# Patient Record
Sex: Female | Born: 1955 | Race: White | Hispanic: No | Marital: Married | Smoking: Former smoker
Health system: Southern US, Community
[De-identification: ages and names within clinical notes are randomized; demographics above are authoritative.]

## PROBLEM LIST (undated history)

## (undated) DIAGNOSIS — G1221 Amyotrophic lateral sclerosis: Secondary | ICD-10-CM

## (undated) DIAGNOSIS — M1990 Unspecified osteoarthritis, unspecified site: Secondary | ICD-10-CM

## (undated) DIAGNOSIS — K219 Gastro-esophageal reflux disease without esophagitis: Secondary | ICD-10-CM

## (undated) DIAGNOSIS — M069 Rheumatoid arthritis, unspecified: Secondary | ICD-10-CM

## (undated) DIAGNOSIS — N200 Calculus of kidney: Secondary | ICD-10-CM

## (undated) DIAGNOSIS — H9193 Unspecified hearing loss, bilateral: Secondary | ICD-10-CM

## (undated) DIAGNOSIS — M21372 Foot drop, left foot: Secondary | ICD-10-CM

## (undated) DIAGNOSIS — K449 Diaphragmatic hernia without obstruction or gangrene: Secondary | ICD-10-CM

## (undated) DIAGNOSIS — R413 Other amnesia: Secondary | ICD-10-CM

## (undated) DIAGNOSIS — Z8719 Personal history of other diseases of the digestive system: Secondary | ICD-10-CM

## (undated) DIAGNOSIS — M810 Age-related osteoporosis without current pathological fracture: Secondary | ICD-10-CM

## (undated) DIAGNOSIS — R1310 Dysphagia, unspecified: Secondary | ICD-10-CM

## (undated) HISTORY — DX: Calculus of kidney: N20.0

## (undated) HISTORY — DX: Foot drop, left foot: M21.372

## (undated) HISTORY — PX: BREAST BIOPSY: SHX20

## (undated) HISTORY — DX: Dysphagia, unspecified: R13.10

## (undated) HISTORY — PX: ESOPHAGOGASTRODUODENOSCOPY: SHX1529

## (undated) HISTORY — DX: Diaphragmatic hernia without obstruction or gangrene: K44.9

## (undated) HISTORY — DX: Unspecified osteoarthritis, unspecified site: M19.90

## (undated) HISTORY — DX: Age-related osteoporosis without current pathological fracture: M81.0

## (undated) HISTORY — DX: Other amnesia: R41.3

## (undated) HISTORY — DX: Rheumatoid arthritis, unspecified: M06.9

## (undated) HISTORY — DX: Gastro-esophageal reflux disease without esophagitis: K21.9

## (undated) HISTORY — DX: Unspecified hearing loss, bilateral: H91.93

## (undated) HISTORY — PX: ESOPHAGEAL DILATION: SHX303

## (undated) HISTORY — DX: Personal history of other diseases of the digestive system: Z87.19

## (undated) HISTORY — PX: ORIF TIBIA FRACTURE: SHX5416

---

## 2000-05-08 HISTORY — PX: ABDOMINAL HYSTERECTOMY: SHX81

## 2004-04-26 ENCOUNTER — Ambulatory Visit: Payer: Self-pay | Admitting: Unknown Physician Specialty

## 2004-04-26 HISTORY — PX: COLONOSCOPY: SHX174

## 2005-02-01 ENCOUNTER — Ambulatory Visit: Payer: Self-pay | Admitting: Unknown Physician Specialty

## 2005-02-02 ENCOUNTER — Emergency Department: Payer: Self-pay | Admitting: Emergency Medicine

## 2005-02-02 ENCOUNTER — Other Ambulatory Visit: Payer: Self-pay

## 2005-05-23 ENCOUNTER — Ambulatory Visit: Payer: Self-pay | Admitting: Internal Medicine

## 2005-05-24 ENCOUNTER — Ambulatory Visit: Payer: Self-pay | Admitting: Internal Medicine

## 2005-09-07 ENCOUNTER — Ambulatory Visit: Payer: Self-pay | Admitting: Unknown Physician Specialty

## 2010-01-09 ENCOUNTER — Emergency Department: Payer: Self-pay | Admitting: Emergency Medicine

## 2010-05-11 ENCOUNTER — Ambulatory Visit: Payer: Self-pay | Admitting: Internal Medicine

## 2010-06-15 ENCOUNTER — Ambulatory Visit: Payer: Self-pay | Admitting: Unknown Physician Specialty

## 2011-05-09 HISTORY — PX: LEG SURGERY: SHX1003

## 2011-08-25 ENCOUNTER — Ambulatory Visit: Payer: Self-pay | Admitting: Internal Medicine

## 2011-09-04 ENCOUNTER — Ambulatory Visit: Payer: Self-pay | Admitting: Emergency Medicine

## 2011-12-20 ENCOUNTER — Emergency Department: Payer: Self-pay | Admitting: Unknown Physician Specialty

## 2011-12-20 LAB — CBC
HCT: 36.1 % (ref 35.0–47.0)
HGB: 11.9 g/dL — ABNORMAL LOW (ref 12.0–16.0)
MCH: 30.2 pg (ref 26.0–34.0)
MCHC: 33 g/dL (ref 32.0–36.0)
MCV: 92 fL (ref 80–100)
Platelet: 273 10*3/uL (ref 150–440)
RBC: 3.94 10*6/uL (ref 3.80–5.20)
RDW: 12.7 % (ref 11.5–14.5)
WBC: 14.2 10*3/uL — ABNORMAL HIGH (ref 3.6–11.0)

## 2011-12-20 LAB — COMPREHENSIVE METABOLIC PANEL
Albumin: 3.9 g/dL (ref 3.4–5.0)
Alkaline Phosphatase: 78 U/L (ref 50–136)
Anion Gap: 8 (ref 7–16)
BUN: 13 mg/dL (ref 7–18)
Bilirubin,Total: 0.6 mg/dL (ref 0.2–1.0)
Calcium, Total: 8.4 mg/dL — ABNORMAL LOW (ref 8.5–10.1)
Chloride: 105 mmol/L (ref 98–107)
Co2: 27 mmol/L (ref 21–32)
Creatinine: 0.7 mg/dL (ref 0.60–1.30)
EGFR (African American): >60
EGFR (Non-African Amer.): >60
Glucose: 123 mg/dL — ABNORMAL HIGH (ref 65–99)
Osmolality: 281 (ref 275–301)
Potassium: 3.4 mmol/L — ABNORMAL LOW (ref 3.5–5.1)
SGOT(AST): 63 U/L — ABNORMAL HIGH (ref 15–37)
SGPT (ALT): 49 U/L (ref 12–78)
Sodium: 140 mmol/L (ref 136–145)
Total Protein: 6.9 g/dL (ref 6.4–8.2)

## 2011-12-20 LAB — PROTIME-INR
INR: 0.9
Prothrombin Time: 12.8 secs (ref 11.5–14.7)

## 2011-12-20 LAB — APTT: Activated PTT: 24 secs (ref 23.6–35.9)

## 2012-03-05 ENCOUNTER — Encounter: Payer: Self-pay | Admitting: Orthopedic Surgery

## 2012-03-08 ENCOUNTER — Encounter: Payer: Self-pay | Admitting: Orthopedic Surgery

## 2012-04-07 ENCOUNTER — Encounter: Payer: Self-pay | Admitting: Orthopedic Surgery

## 2012-05-08 HISTORY — PX: CATARACT EXTRACTION: SUR2

## 2012-07-29 ENCOUNTER — Ambulatory Visit: Payer: Self-pay | Admitting: Ophthalmology

## 2012-08-19 ENCOUNTER — Ambulatory Visit: Payer: Self-pay | Admitting: Ophthalmology

## 2013-03-13 LAB — HM MAMMOGRAPHY

## 2013-08-21 DIAGNOSIS — R768 Other specified abnormal immunological findings in serum: Secondary | ICD-10-CM | POA: Insufficient documentation

## 2013-08-21 DIAGNOSIS — M1990 Unspecified osteoarthritis, unspecified site: Secondary | ICD-10-CM | POA: Insufficient documentation

## 2013-10-02 DIAGNOSIS — N200 Calculus of kidney: Secondary | ICD-10-CM | POA: Insufficient documentation

## 2013-11-04 ENCOUNTER — Emergency Department: Payer: Self-pay | Admitting: Emergency Medicine

## 2013-11-04 LAB — URINALYSIS, COMPLETE
Bacteria: NONE SEEN
Bilirubin,UR: NEGATIVE
Blood: NEGATIVE
Glucose,UR: NEGATIVE mg/dL (ref 0–75)
Leukocyte Esterase: NEGATIVE
Nitrite: NEGATIVE
Ph: 5 (ref 4.5–8.0)
Protein: NEGATIVE
RBC,UR: 2 /HPF (ref 0–5)
Specific Gravity: 1.017 (ref 1.003–1.030)
Squamous Epithelial: NONE SEEN
WBC UR: 1 /HPF (ref 0–5)

## 2013-11-04 LAB — CBC WITH DIFFERENTIAL/PLATELET
Basophil #: 0 10*3/uL (ref 0.0–0.1)
Basophil %: 0.3 %
Eosinophil #: 0.1 10*3/uL (ref 0.0–0.7)
Eosinophil %: 1 %
HCT: 39 % (ref 35.0–47.0)
HGB: 13.3 g/dL (ref 12.0–16.0)
Lymphocyte #: 2.9 10*3/uL (ref 1.0–3.6)
Lymphocyte %: 30.7 %
MCH: 31.2 pg (ref 26.0–34.0)
MCHC: 34 g/dL (ref 32.0–36.0)
MCV: 92 fL (ref 80–100)
Monocyte #: 0.6 x10 3/mm (ref 0.2–0.9)
Monocyte %: 6.7 %
Neutrophil #: 5.8 10*3/uL (ref 1.4–6.5)
Neutrophil %: 61.3 %
Platelet: 260 10*3/uL (ref 150–440)
RBC: 4.25 10*6/uL (ref 3.80–5.20)
RDW: 12.4 % (ref 11.5–14.5)
WBC: 9.4 10*3/uL (ref 3.6–11.0)

## 2013-11-04 LAB — COMPREHENSIVE METABOLIC PANEL
Albumin: 4.2 g/dL (ref 3.4–5.0)
Alkaline Phosphatase: 65 U/L
Anion Gap: 8 (ref 7–16)
BUN: 16 mg/dL (ref 7–18)
Bilirubin,Total: 1 mg/dL (ref 0.2–1.0)
Calcium, Total: 8.7 mg/dL (ref 8.5–10.1)
Chloride: 104 mmol/L (ref 98–107)
Co2: 24 mmol/L (ref 21–32)
Creatinine: 0.83 mg/dL (ref 0.60–1.30)
EGFR (African American): >60
EGFR (Non-African Amer.): >60
Glucose: 125 mg/dL — ABNORMAL HIGH (ref 65–99)
Osmolality: 275 (ref 275–301)
Potassium: 3.8 mmol/L (ref 3.5–5.1)
SGOT(AST): 33 U/L (ref 15–37)
SGPT (ALT): 31 U/L (ref 12–78)
Sodium: 136 mmol/L (ref 136–145)
Total Protein: 7.4 g/dL (ref 6.4–8.2)

## 2013-11-04 LAB — LIPASE, BLOOD: Lipase: 125 U/L (ref 73–393)

## 2014-02-11 ENCOUNTER — Ambulatory Visit: Payer: Self-pay | Admitting: Unknown Physician Specialty

## 2014-02-18 ENCOUNTER — Ambulatory Visit: Payer: Self-pay | Admitting: Podiatry

## 2014-03-08 HISTORY — PX: KNEE SURGERY: SHX244

## 2014-03-13 ENCOUNTER — Ambulatory Visit: Payer: Self-pay | Admitting: Unknown Physician Specialty

## 2014-03-23 LAB — HM PAP SMEAR

## 2014-03-26 ENCOUNTER — Ambulatory Visit: Payer: Self-pay | Admitting: Family Medicine

## 2014-04-03 ENCOUNTER — Ambulatory Visit: Payer: Self-pay | Admitting: Family Medicine

## 2014-05-07 DIAGNOSIS — Z9889 Other specified postprocedural states: Secondary | ICD-10-CM | POA: Insufficient documentation

## 2014-05-18 ENCOUNTER — Ambulatory Visit: Payer: Self-pay | Admitting: Family Medicine

## 2014-07-02 ENCOUNTER — Ambulatory Visit: Payer: Self-pay | Admitting: Unknown Physician Specialty

## 2014-07-02 DIAGNOSIS — IMO0001 Reserved for inherently not codable concepts without codable children: Secondary | ICD-10-CM | POA: Insufficient documentation

## 2014-07-13 ENCOUNTER — Ambulatory Visit: Payer: Self-pay | Admitting: Unknown Physician Specialty

## 2014-07-15 ENCOUNTER — Ambulatory Visit: Payer: Self-pay | Admitting: Unknown Physician Specialty

## 2014-08-28 NOTE — Op Note (Signed)
PATIENT NAME:  Amy Gregory, Amy Gregory MR#:  [id number] DATE OF BIRTH:  [birth year]  DATE OF PROCEDURE: 08/19/2012   PREOPERATIVE DIAGNOSIS: Cataract, right eye.   POSTOPERATIVE DIAGNOSIS: Cataract, right eye.   PROCEDURE PERFORMED: Extracapsular cataract extraction using phacoemulsification with placement of an Alcon SN6CWS, 12.5-diopter posterior chamber lens, serial [id number].   SURGEON: Maylon Peppers. Tomia Enlow, M.D.   ANESTHESIA: 4% lidocaine, 0.75% Marcaine in a 50:50 mixture with 10 units/mL of Hylenex added, given as a peribulbar.   ANESTHESIOLOGIST: Dr. Pernell Dupre.   COMPLICATIONS: None.   ESTIMATED BLOOD LOSS: Less than 1 mL.   DESCRIPTION OF PROCEDURE:  The patient was brought to the operating room and given a peribulbar block.  The patient was then prepped and draped in the usual fashion.  The vertical rectus muscles were imbricated using 5-0 silk sutures.  These sutures were then clamped to the sterile drapes as bridle sutures.  A limbal peritomy was performed extending two clock hours and hemostasis was obtained with cautery.  A partial thickness scleral groove was made at the surgical limbus and dissected anteriorly in a lamellar dissection using an Alcon crescent knife.  The anterior chamber was entered superonasally with a Superblade and through the lamellar dissection with a 2.6 mm keratome.  DisCoVisc was used to replace the aqueous and a continuous tear capsulorrhexis was carried out.  Hydrodissection and hydrodelineation were carried out with balanced salt and a 27 gauge canula.  The nucleus was rotated to confirm the effectiveness of the hydrodissection.  Phacoemulsification was carried out using a divide-and-conquer technique.  Total ultrasound time was 1 minute, 5.5 seconds, average power of 24.9%. CDE of 32.19. An AcrySert delivery system was used instead of a Paramedic.  Irrigation/aspiration was used to remove the residual cortex.  DisCoVisc was used to inflate the capsule  and the internal incision was enlarged to 3 mm with the crescent knife.  Irrigation/aspiration was used to remove the residual DisCoVisc.  Miostat was injected into the anterior chamber through the paracentesis track to inflate the anterior chamber and induce miosis.  The wound was checked for leaks and none were found. The conjunctiva was closed with cautery. Two drops of 0.3% Vigamox were placed on the eye.   An eye shield was placed on the eye.  The patient was discharged to the recovery room in good condition.     ____________________________ Maylon Peppers Lenord Fralix, MD sad:dm D: 08/19/2012 13:24:17 ET T: 08/19/2012 14:00:34 ET JOB#: [id number]  cc: Viviann Spare A. Myshawn Chiriboga, MD, <Dictator> Erline Levine MD ELECTRONICALLY SIGNED 08/26/2012 14:22

## 2014-08-28 NOTE — Op Note (Signed)
PATIENT NAME:  Amy Gregory, Amy Gregory MR#:  [id number] DATE OF BIRTH:  02-11-1956  DATE OF SURGERY:  07/29/2012  PREOPERATIVE DIAGNOSIS: Cataract, left eye.   POSTOPERATIVE DIAGNOSIS: Cataract, left eye.   PROCEDURE PERFORMED: Extracapsular cataract extraction using phacoemulsification with placement of an Alcon SN6CWS, 13.0-diopter posterior chamber lens, serial [id number].   SURGEON: Maylon Peppers. Arshiya Jakes, M.D.   ANESTHESIA: 4% lidocaine, 0.75% Marcaine in a 50:50 mixture with 10 units/mL of Hylenex  added given as a peribulbar.   ANESTHESIOLOGIST: Dr. Dimple Casey   COMPLICATIONS: None.   ESTIMATED BLOOD LOSS: Less than 1 mL.   DESCRIPTION OF PROCEDURE:  The patient was brought to the operating room and given a peribulbar block.  The patient was then prepped and draped in the usual fashion.  The vertical rectus muscles were imbricated using 5-0 silk sutures.  These sutures were then clamped to the sterile drapes as bridle sutures.  A limbal peritomy was performed extending two clock hours and hemostasis was obtained with cautery.  A partial thickness scleral groove was made at the surgical limbus and dissected anteriorly in a lamellar dissection using an Alcon crescent knife.  The anterior chamber was entered supero-temporally with a Superblade and through the lamellar dissection with a 2.6 mm keratome.  DisCoVisc was used to replace the aqueous and a continuous tear capsulorrhexis was carried out.  Hydrodissection and hydrodelineation were carried out with balanced salt and a 27-gauge cannula.  The nucleus was rotated to confirm the effectiveness of the hydrodissection.  Phacoemulsification was carried out using a divide-and-conquer technique.      Irrigation/aspiration was used to remove the residual cortex.  DisCoVisc was used to inflate the capsule and the internal incision was enlarged to 3 mm with the crescent knife.  The intraocular lens was folded and inserted into the capsular bag using  the [employer].  Irrigation/aspiration was used to remove the residual DisCoVisc.  Miostat was injected into the anterior chamber through the paracentesis track to inflate the anterior chamber and induce miosis.    Ultrasound time was 58 seconds with an average power of 24%. CDE of 25.62. No suture was placed. Miostat was injected. An AcrySert delivery system was used instead of a Paramedic, and 0.1 mL of cefuroxime was injected into the anterior chamber via the paracentesis.   The wound was checked for leaks and none were found. The conjunctiva was closed with cautery. Two drops of 0.3% Vigamox were placed on the eye.   An eye shield was placed on the eye.  The patient was discharged to the recovery room in good condition.   ____________________________ Maylon Peppers Chancey Cullinane, MD sad:dm D: 07/29/2012 14:17:08 ET T: 07/29/2012 14:48:18 ET JOB#: [id number]  cc: Viviann Spare A. Vallery Mcdade, MD, <Dictator> Erline Levine MD ELECTRONICALLY SIGNED 08/05/2012 13:40

## 2014-08-31 LAB — SURGICAL PATHOLOGY

## 2015-02-18 DIAGNOSIS — M25569 Pain in unspecified knee: Secondary | ICD-10-CM | POA: Insufficient documentation

## 2015-03-04 DIAGNOSIS — M179 Osteoarthritis of knee, unspecified: Secondary | ICD-10-CM | POA: Insufficient documentation

## 2015-03-04 DIAGNOSIS — M1712 Unilateral primary osteoarthritis, left knee: Secondary | ICD-10-CM | POA: Insufficient documentation

## 2015-03-04 DIAGNOSIS — M1710 Unilateral primary osteoarthritis, unspecified knee: Secondary | ICD-10-CM | POA: Insufficient documentation

## 2015-05-26 ENCOUNTER — Encounter: Payer: Self-pay | Admitting: Podiatry

## 2015-05-26 ENCOUNTER — Ambulatory Visit: Payer: Self-pay | Admitting: Podiatry

## 2015-05-26 ENCOUNTER — Ambulatory Visit (INDEPENDENT_AMBULATORY_CARE_PROVIDER_SITE_OTHER): Payer: BLUE CROSS/BLUE SHIELD | Admitting: Podiatry

## 2015-05-26 ENCOUNTER — Ambulatory Visit (INDEPENDENT_AMBULATORY_CARE_PROVIDER_SITE_OTHER): Payer: BLUE CROSS/BLUE SHIELD

## 2015-05-26 VITALS — BP 138/69 | HR 75 | Resp 16

## 2015-05-26 DIAGNOSIS — M722 Plantar fascial fibromatosis: Secondary | ICD-10-CM

## 2015-05-26 DIAGNOSIS — M7752 Other enthesopathy of left foot: Secondary | ICD-10-CM

## 2015-05-26 DIAGNOSIS — M779 Enthesopathy, unspecified: Principal | ICD-10-CM

## 2015-05-26 DIAGNOSIS — M778 Other enthesopathies, not elsewhere classified: Secondary | ICD-10-CM

## 2015-05-26 DIAGNOSIS — S92302A Fracture of unspecified metatarsal bone(s), left foot, initial encounter for closed fracture: Secondary | ICD-10-CM | POA: Diagnosis not present
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
## 2015-06-28 ENCOUNTER — Ambulatory Visit (INDEPENDENT_AMBULATORY_CARE_PROVIDER_SITE_OTHER): Payer: BLUE CROSS/BLUE SHIELD | Admitting: Podiatry

## 2015-06-28 ENCOUNTER — Ambulatory Visit (INDEPENDENT_AMBULATORY_CARE_PROVIDER_SITE_OTHER): Payer: BLUE CROSS/BLUE SHIELD

## 2015-06-28 ENCOUNTER — Encounter: Payer: Self-pay | Admitting: Podiatry

## 2015-06-28 DIAGNOSIS — S92302D Fracture of unspecified metatarsal bone(s), left foot, subsequent encounter for fracture with routine healing: Secondary | ICD-10-CM | POA: Diagnosis not present

## 2015-06-28 DIAGNOSIS — S92302A Fracture of unspecified metatarsal bone(s), left foot, initial encounter for closed fracture: Secondary | ICD-10-CM

## 2015-06-28 NOTE — Patient Instructions (Signed)

## 2015-06-28 NOTE — Progress Notes (Signed)
She presents today for fracture of third metatarsal neck left foot. She states this seems to be doing better. She states that she has been hitting the foot and stepping on the foot but has not been wearing the boot. She also presents today to pick up her orthotics.  Objective: Vital signs stable alert and oriented 3. No pain on palpation third metatarsal head of the left foot. Orthotics do not fit the left prominent metatarsal.  Assessment: Well-healing fracture however metatarsal and sulcus alignment on the orthotics are not aligning.  Plan: We will send back the orthotics to have it remade.

## 2015-07-06 DIAGNOSIS — R1310 Dysphagia, unspecified: Secondary | ICD-10-CM | POA: Insufficient documentation

## 2015-07-06 DIAGNOSIS — H9193 Unspecified hearing loss, bilateral: Secondary | ICD-10-CM | POA: Insufficient documentation

## 2015-07-06 DIAGNOSIS — R413 Other amnesia: Secondary | ICD-10-CM | POA: Insufficient documentation

## 2015-07-06 DIAGNOSIS — Z8719 Personal history of other diseases of the digestive system: Secondary | ICD-10-CM | POA: Insufficient documentation

## 2015-07-13 ENCOUNTER — Encounter: Payer: Self-pay | Admitting: Family Medicine

## 2015-07-13 ENCOUNTER — Ambulatory Visit (INDEPENDENT_AMBULATORY_CARE_PROVIDER_SITE_OTHER): Payer: BLUE CROSS/BLUE SHIELD | Admitting: Family Medicine

## 2015-07-13 VITALS — BP 126/78 | HR 57 | Temp 98.0°F | Ht 65.0 in | Wt 178.0 lb

## 2015-07-13 DIAGNOSIS — Z0000 Encounter for general adult medical examination without abnormal findings: Secondary | ICD-10-CM

## 2015-07-13 DIAGNOSIS — R1011 Right upper quadrant pain: Secondary | ICD-10-CM | POA: Diagnosis not present

## 2015-07-13 DIAGNOSIS — E559 Vitamin D deficiency, unspecified: Secondary | ICD-10-CM | POA: Insufficient documentation

## 2015-07-13 DIAGNOSIS — R350 Frequency of micturition: Secondary | ICD-10-CM | POA: Diagnosis not present

## 2015-07-13 DIAGNOSIS — M170 Bilateral primary osteoarthritis of knee: Secondary | ICD-10-CM

## 2015-07-13 DIAGNOSIS — M8580 Other specified disorders of bone density and structure, unspecified site: Secondary | ICD-10-CM | POA: Diagnosis not present

## 2015-07-13 LAB — UA/M W/RFLX CULTURE, ROUTINE
Bilirubin, UA: NEGATIVE
Glucose, UA: NEGATIVE
Ketones, UA: NEGATIVE
Leukocytes, UA: NEGATIVE
Nitrite, UA: NEGATIVE
Protein, UA: NEGATIVE
RBC, UA: NEGATIVE
Specific Gravity, UA: 1.02 (ref 1.005–1.030)
Urobilinogen, Ur: 0.2 mg/dL (ref 0.2–1.0)
pH, UA: 7.5 (ref 5.0–7.5)

## 2015-07-13 NOTE — Progress Notes (Signed)
Patient ID: Amy Gregory, female   DOB: 15-Aug-1955, 60 y.o.   MRN: [id number]   Subjective:   Amy Gregory is a 60 y.o. female here for a complete physical exam  Interim issues since last visit:  USPSTF grade A and B recommendations Alcohol: no Depression:  Depression screen [facility] 2/9 07/13/2015  Decreased Interest 0  Down, Depressed, Hopeless 0  PHQ - 2 Score 0   Hypertension: controlled today on teh recheck Obesity: she has hopes to get down to 150 pounds Tobacco use: nonsmoker HIV, hep B, hep C: declined STD testing and prevention (chl/gon/syphilis): declined Lipids: check today Glucose: check today Colorectal cancer: done 2016 Breast cancer: did mammogram this morning BRCA gene screening: no breast or ovarian cancer Intimate partner violence:no Cervical cancer screening: s/p hysterectomy Lung cancer: n/a Osteoporosis: "bones look fine" per rheum, but foot doctor said bones look fragile Fall prevention/vitamin D: taking vit D; discussed AAA: n/a Aspirin: suggest 81 mg aspirin daily (coated) Diet: good eater Exercise: can't do much because of knees; can't swim Skin cancer: one mole on abdomen and one right hip   Past Medical History  Diagnosis Date  . Bilateral hearing loss   . Dysphagia   . Impaired memory   . GERD (gastroesophageal reflux disease)   . Asthma   . Hiatal hernia     small  . OA (osteoarthritis)   . Rheumatoid arthritis ([place])     managed by Dr. Jefm Bryant  . Nephrolithiasis   . History of diverticulitis    Past Surgical History  Procedure Laterality Date  . Abdominal hysterectomy  2002    for fibroids, non cancerous reasons, ovaries remain  . Cataract extraction  2014    OU  . Knee surgery Right 03/2014    partial medial and lateral meniscectomy and medial condryle debridement  . Orif tibia fracture      remote ORIF proximal left tiba fracture  . Leg surgery  2013    fractured left leg, toes, hospitalized for 2 weeks  . Breast biopsy  Left 2005-ish    benign  . Colonoscopy  04/26/04  . Esophagogastroduodenoscopy  02/01/05 and 07/13/14  . Esophageal dilation     Family History  Problem Relation Age of Onset  . Heart disease Mother   . Heart attack Mother   . Rheum arthritis Mother   . Stroke Mother   . Arthritis Mother     RA  . Hypertension Mother   . Stroke Father   . Diabetes Father   . Alzheimer's disease Sister   . Cancer Sister     colon and lung  . Hypertension Sister   . Thyroid disease Sister   . Cancer Sister     lymphoma   Social History  Substance Use Topics  . Smoking status: Never Smoker   . Smokeless tobacco: Never Used  . Alcohol Use: No   Review of Systems  Objective:   Filed Vitals:   07/13/15 0948 07/13/15 1014  BP: 147/77 126/78  Pulse: 60 57  Temp: 98 F (36.7 C)   Height: '5\' 5"'$  (1.651 m)   Weight: 178 lb (80.74 kg)   SpO2: 100%    Body mass index is 29.62 kg/(m^2). Wt Readings from Last 3 Encounters:  07/13/15 178 lb (80.74 kg)  05/25/14 185 lb (83.915 kg)   Today's Vitals   07/13/15 0948 07/13/15 1014  BP: 147/77 126/78  Pulse: 60 57  Temp: 98 F (36.7 C)   Height:  $'5\' 5"'z$  (1.651 m)   Weight: 178 lb (80.74 kg)   SpO2: 100%    Physical Exam  Constitutional: She appears well-developed and well-nourished.  HENT:  Head: Normocephalic and atraumatic.  Right Ear: Hearing, tympanic membrane, external ear and ear canal normal.  Left Ear: Hearing, tympanic membrane, external ear and ear canal normal.  Eyes: Conjunctivae and EOM are normal. Right eye exhibits no hordeolum. Left eye exhibits no hordeolum. No scleral icterus.  Neck: No thyromegaly present.  Cardiovascular: Normal rate, regular rhythm, S1 normal, S2 normal and normal heart sounds.   No extrasystoles are present.  Pulmonary/Chest: Effort normal and breath sounds normal. No respiratory distress. Right breast exhibits no inverted nipple, no mass, no nipple discharge, no skin change and no tenderness. Left  breast exhibits no inverted nipple, no mass, no nipple discharge, no skin change and no tenderness. Breasts are symmetrical.  Surgical scar superior left breast; dominant vein across left areola (NOT new per patient, present many many years)  Abdominal: Soft. Normal appearance and bowel sounds are normal. She exhibits no distension, no abdominal bruit, no pulsatile midline mass and no mass. There is no hepatosplenomegaly. There is tenderness in the right upper quadrant. No hernia.  Musculoskeletal: Normal range of motion. She exhibits no edema.       Right knee: She exhibits deformity (bony enlargement).       Left knee: She exhibits deformity (bony enlargment).  Lymphadenopathy:       Head (right side): No submandibular adenopathy present.       Head (left side): No submandibular adenopathy present.    She has no cervical adenopathy.    She has no axillary adenopathy.  Neurological: She is alert. She displays no tremor. No cranial nerve deficit. She exhibits normal muscle tone.  No tics  Skin: Skin is warm and dry. No bruising and no ecchymosis noted. No cyanosis. No pallor.  Psychiatric: Her speech is normal and behavior is normal. Thought content normal. Her mood appears not anxious. She does not exhibit a depressed mood.   Assessment/Plan:   Problem List Items Addressed This Visit      Musculoskeletal and Integument   Arthritis, degenerative    Managed by rheum and ortho      Osteopenia    Noted on foot xray reports from Jan; will get formal DEXA scan; fall precautions discussed; daily vit D3 1000 iu daily      Relevant Orders   DG Bone Density   VITAMIN D 25 Hydroxy (Vit-D Deficiency, Fractures)     Other   Increased urinary frequency   Relevant Orders   UA/M w/rflx Culture, Routine (Completed)   Vitamin D deficiency    Check level; will have her take 1000 iu vitamin D3 daily unless deficient      Preventative health care - Primary    USPSTF grade A and B  recommendations reviewed with patient; age-appropriate recommendations, preventive care, screening tests, etc discussed and encouraged; healthy living encouraged; see AVS for patient education given to patient; she declined hiv, hepatitis, std testing      Relevant Orders   Lipid Panel w/o Chol/HDL Ratio   CBC with Differential/Platelet   TSH   Comprehensive metabolic panel   Right upper quadrant abdominal pain    Patient has seen GI for symptoms; has had testing; she thinks she needs her gallbladder out; will refer to surgeon for evaluation; avoid/limit greasy foods, any triggers; to ER if symptoms worsen  Relevant Orders   Ambulatory referral to General Surgery       Meds ordered this encounter  Medications  . cholecalciferol (VITAMIN D) 1000 units tablet    Sig: Take 1,000 Units by mouth daily.   Orders Placed This Encounter  Procedures  . DG Bone Density    Order Specific Question:  Reason for Exam (SYMPTOM  OR DIAGNOSIS REQUIRED)    Answer:  osteopenia    Order Specific Question:  Preferred imaging location?    Answer:  [facility]    Order Specific Question:  Is the patient pregnant?    Answer:  No  . Lipid Panel w/o Chol/HDL Ratio    Order Specific Question:  Has the patient fasted?    Answer:  Yes  . CBC with Differential/Platelet  . TSH  . VITAMIN D 25 Hydroxy (Vit-D Deficiency, Fractures)  . Comprehensive metabolic panel    Order Specific Question:  Has the patient fasted?    Answer:  Yes  . UA/M w/rflx Culture, Routine  . Ambulatory referral to General Surgery    Referral Priority:  Medium    Referral Type:  Surgical    Referral Reason:  Specialty Services Required    Requested Specialty:  General Surgery    Number of Visits Requested:  1    Follow up plan: Return in about 1 year (around 07/12/2016) for complete physical.  An after-visit summary was printed and given to the patient at [facility].  Please see the patient instructions which may  contain other information and recommendations beyond what is mentioned above in the assessment and plan.  Orders Placed This Encounter  Procedures  . DG Bone Density  . Lipid Panel w/o Chol/HDL Ratio  . CBC with Differential/Platelet  . TSH  . VITAMIN D 25 Hydroxy (Vit-D Deficiency, Fractures)  . Comprehensive metabolic panel  . UA/M w/rflx Culture, Routine  . Ambulatory referral to General Surgery

## 2015-07-13 NOTE — Assessment & Plan Note (Signed)
Managed by rheum and ortho

## 2015-07-13 NOTE — Assessment & Plan Note (Signed)
USPSTF grade A and B recommendations reviewed with patient; age-appropriate recommendations, preventive care, screening tests, etc discussed and encouraged; healthy living encouraged; see AVS for patient education given to patient; she declined hiv, hepatitis, std testing

## 2015-07-13 NOTE — Assessment & Plan Note (Signed)
Check level; will have her take 1000 iu vitamin D3 daily unless deficient

## 2015-07-13 NOTE — Patient Instructions (Addendum)
We'll get lab results today We'll refer you to a general surgeon about your possible gallbladder issues If you have not heard anything from my staff in a week about any orders/referrals/studies from today, please contact us here to follow-up [phone] Please do call to schedule your bone density test; the number to schedule one at either [facility] or [facility] is [phone] If you develop significant abdominal pain, go to the ER to get checked out Limit fatty/greasy foods Try to limit saturated fats in your diet (bologna, hot dogs, barbeque, cheeseburgers, hamburgers, steak, bacon, sausage, cheese, etc.) and get more fresh fruits, vegetables, and whole grains Keep up the good work with weight loss; try to lose another 5-10 pounds over the next few months I'll be moving to another practice, [facility], on August 09, 2015 I'll be here until August 06, 2015 You are welcome to either follow me there for ongoing care, or you can stay here at [facility] and see Dr. Park Liter or our nurse practitioner [place] [facility], Female Adopting a healthy lifestyle and getting preventive care can go a long way to promote health and wellness. Talk with your health care provider about what schedule of regular examinations is right for you. This is a good chance for you to check in with your provider about disease prevention and staying healthy. In between checkups, there are plenty of things you can do on your own. Experts have done a lot of research about which lifestyle changes and preventive measures are most likely to keep you healthy. Ask your health care provider for more information. WEIGHT AND DIET  Eat a healthy diet  Be sure to include plenty of vegetables, fruits, low-fat dairy products, and lean protein.  Do not eat a lot of foods high in solid fats, added sugars, or salt.  Get regular exercise. This is one of the most important  things you can do for your health.  Most adults should exercise for at least 150 minutes each week. The exercise should increase your heart rate and make you sweat (moderate-intensity exercise).  Most adults should also do strengthening exercises at least twice a week. This is in addition to the moderate-intensity exercise.  Maintain a healthy weight  Body mass index (BMI) is a measurement that can be used to identify possible weight problems. It estimates body fat based on height and weight. Your health care provider can help determine your BMI and help you achieve or maintain a healthy weight.  For females 54 years of age and older:   A BMI below 18.5 is considered underweight.  A BMI of 18.5 to 24.9 is normal.  A BMI of 25 to 29.9 is considered overweight.  A BMI of 30 and above is considered obese.  Watch levels of cholesterol and blood lipids  You should start having your blood tested for lipids and cholesterol at 59 years of age, then have this test every 5 years.  You may need to have your cholesterol levels checked more often if:  Your lipid or cholesterol levels are high.  You are older than 59 years of age.  You are at high risk for heart disease.  CANCER SCREENING   Lung Cancer  Lung cancer screening is recommended for adults 37-5 years old who are at high risk for lung cancer because of a history of smoking.  A yearly low-dose CT scan of the lungs is recommended for people who:  Currently smoke.  Have quit within the past 15 years.  Have at least a 30-pack-year history of smoking. A pack year is smoking an average of one pack of cigarettes a day for 1 year.  Yearly screening should continue until it has been 15 years since you quit.  Yearly screening should stop if you develop a health problem that would prevent you from having lung cancer treatment.  Breast Cancer  Practice breast self-awareness. This means understanding how your breasts normally  appear and feel.  It also means doing regular breast self-exams. Let your health care provider know about any changes, no matter how small.  If you are in your 20s or 30s, you should have a clinical breast exam (CBE) by a health care provider every 1-3 years as part of a regular health exam.  If you are 10 or older, have a CBE every year. Also consider having a breast X-ray (mammogram) every year.  If you have a family history of breast cancer, talk to your health care provider about genetic screening.  If you are at high risk for breast cancer, talk to your health care provider about having an MRI and a mammogram every year.  Breast cancer gene (BRCA) assessment is recommended for women who have family members with BRCA-related cancers. BRCA-related cancers include:  Breast.  Ovarian.  Tubal.  Peritoneal cancers.  Results of the assessment will determine the need for genetic counseling and BRCA1 and BRCA2 testing. Cervical Cancer Your health care provider may recommend that you be screened regularly for cancer of the pelvic organs (ovaries, uterus, and vagina). This screening involves a pelvic examination, including checking for microscopic changes to the surface of your cervix (Pap test). You may be encouraged to have this screening done every 3 years, beginning at age 61.  For women ages 49-65, health care providers may recommend pelvic exams and Pap testing every 3 years, or they may recommend the Pap and pelvic exam, combined with testing for human papilloma virus (HPV), every 5 years. Some types of HPV increase your risk of cervical cancer. Testing for HPV may also be done on women of any age with unclear Pap test results.  Other health care providers may not recommend any screening for nonpregnant women who are considered low risk for pelvic cancer and who do not have symptoms. Ask your health care provider if a screening pelvic exam is right for you.  If you have had past  treatment for cervical cancer or a condition that could lead to cancer, you need Pap tests and screening for cancer for at least 20 years after your treatment. If Pap tests have been discontinued, your risk factors (such as having a new sexual partner) need to be reassessed to determine if screening should resume. Some women have medical problems that increase the chance of getting cervical cancer. In these cases, your health care provider may recommend more frequent screening and Pap tests. Colorectal Cancer  This type of cancer can be detected and often prevented.  Routine colorectal cancer screening usually begins at 59 years of age and continues through 59 years of age.  Your health care provider may recommend screening at an earlier age if you have risk factors for colon cancer.  Your health care provider may also recommend using home test kits to check for hidden blood in the stool.  A small camera at the end of a tube can be used to examine your colon directly (sigmoidoscopy or colonoscopy). This is  done to check for the earliest forms of colorectal cancer.  Routine screening usually begins at age 79.  Direct examination of the colon should be repeated every 5-10 years through 59 years of age. However, you may need to be screened more often if early forms of precancerous polyps or small growths are found. Skin Cancer  Check your skin from head to toe regularly.  Tell your health care provider about any new moles or changes in moles, especially if there is a change in a mole's shape or color.  Also tell your health care provider if you have a mole that is larger than the size of a pencil eraser.  Always use sunscreen. Apply sunscreen liberally and repeatedly throughout the day.  Protect yourself by wearing long sleeves, pants, a wide-brimmed hat, and sunglasses whenever you are outside. HEART DISEASE, DIABETES, AND HIGH BLOOD PRESSURE   High blood pressure causes heart disease and  increases the risk of stroke. High blood pressure is more likely to develop in:  People who have blood pressure in the high end of the normal range (130-139/85-89 mm Hg).  People who are overweight or obese.  People who are African American.  If you are 78-72 years of age, have your blood pressure checked every 3-5 years. If you are 43 years of age or older, have your blood pressure checked every year. You should have your blood pressure measured twice--once when you are at a hospital or clinic, and once when you are not at a hospital or clinic. Record the average of the two measurements. To check your blood pressure when you are not at a hospital or clinic, you can use:  An automated blood pressure machine at a pharmacy.  A home blood pressure monitor.  If you are between 9 years and 67 years old, ask your health care provider if you should take aspirin to prevent strokes.  Have regular diabetes screenings. This involves taking a blood sample to check your fasting blood sugar level.  If you are at a normal weight and have a low risk for diabetes, have this test once every three years after 59 years of age.  If you are overweight and have a high risk for diabetes, consider being tested at a younger age or more often. PREVENTING INFECTION  Hepatitis B  If you have a higher risk for hepatitis B, you should be screened for this virus. You are considered at high risk for hepatitis B if:  You were born in a country where hepatitis B is common. Ask your health care provider which countries are considered high risk.  Your parents were born in a high-risk country, and you have not been immunized against hepatitis B (hepatitis B vaccine).  You have HIV or AIDS.  You use needles to inject street drugs.  You live with someone who has hepatitis B.  You have had sex with someone who has hepatitis B.  You get hemodialysis treatment.  You take certain medicines for conditions, including  cancer, organ transplantation, and autoimmune conditions. Hepatitis C  Blood testing is recommended for:  Everyone born from [birth year] through 1964.  Anyone with known risk factors for hepatitis C. Sexually transmitted infections (STIs)  You should be screened for sexually transmitted infections (STIs) including gonorrhea and chlamydia if:  You are sexually active and are younger than 59 years of age.  You are older than 59 years of age and your health care provider tells you that you are at risk  for this type of infection.  Your sexual activity has changed since you were last screened and you are at an increased risk for chlamydia or gonorrhea. Ask your health care provider if you are at risk.  If you do not have HIV, but are at risk, it may be recommended that you take a prescription medicine daily to prevent HIV infection. This is called pre-exposure prophylaxis (PrEP). You are considered at risk if:  You are sexually active and do not regularly use condoms or know the HIV status of your partner(s).  You take drugs by injection.  You are sexually active with a partner who has HIV. Talk with your health care provider about whether you are at high risk of being infected with HIV. If you choose to begin PrEP, you should first be tested for HIV. You should then be tested every 3 months for as long as you are taking PrEP.  PREGNANCY   If you are premenopausal and you may become pregnant, ask your health care provider about preconception counseling.  If you may become pregnant, take 400 to 800 micrograms (mcg) of folic acid every day.  If you want to prevent pregnancy, talk to your health care provider about birth control (contraception). OSTEOPOROSIS AND MENOPAUSE   Osteoporosis is a disease in which the bones lose minerals and strength with aging. This can result in serious bone fractures. Your risk for osteoporosis can be identified using a bone density scan.  If you are 85 years of  age or older, or if you are at risk for osteoporosis and fractures, ask your health care provider if you should be screened.  Ask your health care provider whether you should take a calcium or vitamin D supplement to lower your risk for osteoporosis.  Menopause may have certain physical symptoms and risks.  Hormone replacement therapy may reduce some of these symptoms and risks. Talk to your health care provider about whether hormone replacement therapy is right for you.  HOME CARE INSTRUCTIONS   Schedule regular health, dental, and eye exams.  Stay current with your immunizations.   Do not use any tobacco products including cigarettes, chewing tobacco, or electronic cigarettes.  If you are pregnant, do not drink alcohol.  If you are breastfeeding, limit how much and how often you drink alcohol.  Limit alcohol intake to no more than 1 drink per day for nonpregnant women. One drink equals 12 ounces of beer, 5 ounces of wine, or 1 ounces of hard liquor.  Do not use street drugs.  Do not share needles.  Ask your health care provider for help if you need support or information about quitting drugs.  Tell your health care provider if you often feel depressed.  Tell your health care provider if you have ever been abused or do not feel safe at home.   This information is not intended to replace advice given to you by your health care provider. Make sure you discuss any questions you have with your health care provider.   Document Released: 11/07/2010 Document Revised: 05/15/2014 Document Reviewed: 03/26/2013 Elsevier Interactive Patient Education [employer].

## 2015-07-13 NOTE — Assessment & Plan Note (Signed)
Patient has seen GI for symptoms; has had testing; she thinks she needs her gallbladder out; will refer to surgeon for evaluation; avoid/limit greasy foods, any triggers; to ER if symptoms worsen

## 2015-07-13 NOTE — Assessment & Plan Note (Signed)
Noted on foot xray reports from Jan; will get formal DEXA scan; fall precautions discussed; daily vit D3 1000 iu daily

## 2015-07-14 ENCOUNTER — Encounter: Payer: Self-pay | Admitting: Family Medicine

## 2015-07-14 LAB — CBC WITH DIFFERENTIAL/PLATELET
Basophils Absolute: 0 10*3/uL (ref 0.0–0.2)
Basos: 0 %
EOS (ABSOLUTE): 0.1 10*3/uL (ref 0.0–0.4)
Eos: 2 %
Hematocrit: 39.5 % (ref 34.0–46.6)
Hemoglobin: 13.2 g/dL (ref 11.1–15.9)
Immature Grans (Abs): 0 10*3/uL (ref 0.0–0.1)
Immature Granulocytes: 0 %
Lymphocytes Absolute: 2.1 10*3/uL (ref 0.7–3.1)
Lymphs: 40 %
MCH: 30.1 pg (ref 26.6–33.0)
MCHC: 33.4 g/dL (ref 31.5–35.7)
MCV: 90 fL (ref 79–97)
Monocytes Absolute: 0.5 10*3/uL (ref 0.1–0.9)
Monocytes: 9 %
Neutrophils Absolute: 2.6 10*3/uL (ref 1.4–7.0)
Neutrophils: 49 %
Platelets: 278 10*3/uL (ref 150–379)
RBC: 4.38 x10E6/uL (ref 3.77–5.28)
RDW: 13.2 % (ref 12.3–15.4)
WBC: 5.2 10*3/uL (ref 3.4–10.8)

## 2015-07-14 LAB — COMPREHENSIVE METABOLIC PANEL
ALT: 20 IU/L (ref 0–32)
AST: 19 IU/L (ref 0–40)
Albumin/Globulin Ratio: 1.8 (ref 1.1–2.5)
Albumin: 4.6 g/dL (ref 3.5–5.5)
Alkaline Phosphatase: 81 IU/L (ref 39–117)
BUN/Creatinine Ratio: 21 (ref 9–23)
BUN: 13 mg/dL (ref 6–24)
Bilirubin Total: 1 mg/dL (ref 0.0–1.2)
CO2: 23 mmol/L (ref 18–29)
Calcium: 9.5 mg/dL (ref 8.7–10.2)
Chloride: 101 mmol/L (ref 96–106)
Creatinine, Ser: 0.63 mg/dL (ref 0.57–1.00)
GFR calc Af Amer: 114 mL/min/{1.73_m2} (ref >59)
GFR calc non Af Amer: 99 mL/min/{1.73_m2} (ref >59)
Globulin, Total: 2.5 g/dL (ref 1.5–4.5)
Glucose: 91 mg/dL (ref 65–99)
Potassium: 4.5 mmol/L (ref 3.5–5.2)
Sodium: 141 mmol/L (ref 134–144)
Total Protein: 7.1 g/dL (ref 6.0–8.5)

## 2015-07-14 LAB — LIPID PANEL W/O CHOL/HDL RATIO
Cholesterol, Total: 140 mg/dL (ref 100–199)
HDL: 48 mg/dL (ref >39)
LDL Calculated: 64 mg/dL (ref 0–99)
Triglycerides: 141 mg/dL (ref 0–149)
VLDL Cholesterol Cal: 28 mg/dL (ref 5–40)

## 2015-07-14 LAB — TSH: TSH: 1.09 u[IU]/mL (ref 0.450–4.500)

## 2015-07-14 LAB — VITAMIN D 25 HYDROXY (VIT D DEFICIENCY, FRACTURES): Vit D, 25-Hydroxy: 32.4 ng/mL (ref 30.0–100.0)

## 2015-07-15 ENCOUNTER — Telehealth: Payer: Self-pay | Admitting: General Surgery

## 2015-07-15 NOTE — Telephone Encounter (Signed)
i have called patient's cell phone and left a message on voicemail to make an appointment per referral for general surgery--possible gallbladder.

## 2015-07-27 ENCOUNTER — Encounter: Payer: Self-pay | Admitting: Surgery

## 2015-07-27 ENCOUNTER — Ambulatory Visit (INDEPENDENT_AMBULATORY_CARE_PROVIDER_SITE_OTHER): Payer: BLUE CROSS/BLUE SHIELD | Admitting: Surgery

## 2015-07-27 VITALS — BP 140/69 | HR 80 | Temp 98.1°F | Ht 65.0 in | Wt 182.0 lb

## 2015-07-27 DIAGNOSIS — R1011 Right upper quadrant pain: Principal | ICD-10-CM

## 2015-07-27 DIAGNOSIS — R1010 Upper abdominal pain, unspecified: Secondary | ICD-10-CM

## 2015-07-27 DIAGNOSIS — G8929 Other chronic pain: Secondary | ICD-10-CM

## 2015-07-27 NOTE — Patient Instructions (Addendum)
Please call Dr. Earnest Conroy ofice to be seen again for your abdominal pain.  Your Appointment for your ultrasound will be on 07/29/2015 at 9:15 AM. Please do not eat or drink after midnight the night before. Central Scheduling phone # [phone].  [facility] will be contacting you with an appointment for a consult.

## 2015-07-27 NOTE — Progress Notes (Signed)
Patient ID: Amy Gregory, female   DOB: 01-Jun-1955, 60 y.o.   MRN: [id number]  History of Present Illness Amy Gregory is a 60 y.o. female with Abdominal pain. She is referred by Dr. Sherie Don for surgical consultation. She reports having right upper quadrant pain and diffuse abdominal pain for the last couple of years. Over the last couple months this has exacerbated. Pain is worsening shortly after having anything to eat or drink.. Pain is sharp, intermittent and asked to see with nausea. Of note the patient has had a long history of reflux problems and an esophageal stricture that has been dilated. Dr. Mechele Collin from GI follows her up. He does have intermittent diarrhea and intermittent abdominal pain. Her previous workup has been performed and has included a normal HIDA scan a negative MRI of her abdomen and a negative CT scan of the abdomen and pelvis. She also had had a previous ultrasound showing no evidence of gallstones. She also describes that her pain is in the lower chest wall bilaterally. She has had a recent egd and colonoscopy over the last few months but no records are available. Remote hx of diverticulitis.  Past Medical History Past Medical History  Diagnosis Date  . Bilateral hearing loss   . Dysphagia   . Impaired memory   . GERD (gastroesophageal reflux disease)   . Asthma   . Hiatal hernia     small  . OA (osteoarthritis)   . Rheumatoid arthritis (HCC)     managed by Dr. Gavin Potters  . Nephrolithiasis   . History of diverticulitis      Past Surgical History  Procedure Laterality Date  . Abdominal hysterectomy  2002    for fibroids, non cancerous reasons, ovaries remain  . Cataract extraction  2014    OU  . Knee surgery Right 03/2014    partial medial and lateral meniscectomy and medial condryle debridement  . Orif tibia fracture      remote ORIF proximal left tiba fracture  . Leg surgery  2013    fractured left leg, toes, hospitalized for 2 weeks  . Breast biopsy  Left 2005-ish    benign  . Colonoscopy  04/26/04  . Esophagogastroduodenoscopy  02/01/05 and 07/13/14  . Esophageal dilation      Allergies  Allergen Reactions  . Sulfa Antibiotics     Current Outpatient Prescriptions  Medication Sig Dispense Refill  . Ascorbic Acid (VITAMIN C) 100 MG tablet Take 100 mg by mouth daily.    . cholecalciferol (VITAMIN D) 1000 units tablet Take 1,000 Units by mouth daily.    . diphenhydramine-acetaminophen (TYLENOL PM) 25-500 MG TABS tablet Take by mouth.    . folic acid (FOLVITE) 1 MG tablet Take 1 mg by mouth daily.     . hydroxychloroquine (PLAQUENIL) 200 MG tablet Take 200 mg by mouth 2 (two) times daily.    . methotrexate (RHEUMATREX) 2.5 MG tablet Take 15 mg by mouth once a week. Take 6  2.5mg  pills once a week.    . pantoprazole (PROTONIX) 40 MG tablet TAKE 1 TABLET BY MOUTH ONCE DAILY.     No current facility-administered medications for this visit.    Family History Family History  Problem Relation Age of Onset  . Heart disease Mother   . Heart attack Mother   . Rheum arthritis Mother   . Stroke Mother   . Arthritis Mother     RA  . Hypertension Mother   . Stroke Father   .  Diabetes Father   . Alzheimer's disease Sister   . Cancer Sister     colon and lung  . Hypertension Sister   . Thyroid disease Sister   . Cancer Sister     lymphoma      Social History Social History  Substance Use Topics  . Smoking status: Never Smoker   . Smokeless tobacco: Never Used  . Alcohol Use: No     ROS A 10 point review of system was performed and is otherwise negative other than what is stated in the history of present illness  Physical Exam Blood pressure 140/69, pulse 80, temperature 98.1 F (36.7 C), temperature source Oral, height 5\' 5"  (1.651 m), weight 82.555 kg (182 lb).  CONSTITUTIONAL: NAD EYES: Pupils equal, round, and reactive to light, Sclera non-icteric. EARS, NOSE, MOUTH AND THROAT: The oropharynx is clear. Oral mucosa is  pink and moist. Hearing is intact to voice.  NECK: Trachea is midline, and there is no jugular venous distension. Thyroid is without palpable abnormalities. LYMPH NODES:  Lymph nodes in the neck are not enlarged. RESPIRATORY:  Lungs are clear, and breath sounds are equal bilaterally. Normal respiratory effort without pathologic use of accessory muscles. There is exquisite chest wall tenderness bilaterally CARDIOVASCULAR: Heart is regular without murmurs, gallops, or rubs. GI: The abdomen is soft, diffuse tenderness to palpation, no peritoneal signs There were no palpable masses. There was no hepatosplenomegaly. There were normal bowel sounds.  MUSCULOSKELETAL:  Normal muscle strength and tone in all four extremities.    SKIN: Skin turgor is normal. There are no pathologic skin lesions.  NEUROLOGIC:  Motor and sensation is grossly normal.  Cranial nerves are grossly intact. PSYCH:  Alert and oriented to person, place and time. Affect is normal.  Data Reviewed  I have personally reviewed the patient's imaging and medical records.    Assessment/Plan Abdoninal pain of uncertain etiology, she certainly has had a previous workup that has been negative for GB issues. We'll repeat the ultrasound of the right upper quadrant since been quite a while since she had this. We will also obtain records from GI and from the upper and lower scopes. I will also ask Dr. from GI to see her again about the possibility of IBS and possibility of the reflux causing some of the symptoms. 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 Early and not 100% that her symptoms are related to the gallbladder and before doing any surgical intervention I'll like to make sure that she gets evaluated by both GI and pain medicine. We will see her back in a couple weeks once the above is completed. I have provided  extensive counseling to the patient and she understands.   Mechele Collin, MD FACS  Diego F Pabon 07/27/2015, 3:47 PM

## 2015-07-29 ENCOUNTER — Ambulatory Visit
Admission: RE | Admit: 2015-07-29 | Discharge: 2015-07-29 | Disposition: A | Discharge status: Home / Self Care | Payer: BLUE CROSS/BLUE SHIELD | Source: Ambulatory Visit | Attending: Surgery | Admitting: Surgery

## 2015-07-29 DIAGNOSIS — G8929 Other chronic pain: Secondary | ICD-10-CM

## 2015-07-29 DIAGNOSIS — R1010 Upper abdominal pain, unspecified: Secondary | ICD-10-CM | POA: Insufficient documentation

## 2015-07-29 DIAGNOSIS — R932 Abnormal findings on diagnostic imaging of liver and biliary tract: Secondary | ICD-10-CM | POA: Diagnosis not present

## 2015-07-29 DIAGNOSIS — R1011 Right upper quadrant pain: Secondary | ICD-10-CM

## 2015-08-10 ENCOUNTER — Telehealth: Payer: Self-pay | Admitting: Surgery

## 2015-08-10 NOTE — Telephone Encounter (Signed)
I have called patient to make an appointment with Dr Servando Snare per Dr Everlene Farrier. No answer. I have left a message on voicemail for patient to call office to make appointment with GI. I have also sent a referral to the [facility] in [facility] for intercostal neuralgia.

## 2015-08-10 NOTE — Telephone Encounter (Signed)
Patient was returning call from earlier about a referral to GI. After speaking to patient, I realized there was miscommunication as patient already has a GI doctor, Dr Mechele Collin. I told patient to disregard the message about the referral to Dr Servando Snare, but that we did send in a referral to pain management and someone would be contacting her from pain management for an appointment.

## 2015-08-10 NOTE — Telephone Encounter (Signed)
Patient has had a recent appointment with Dr Markham Jordan.

## 2015-09-07 ENCOUNTER — Other Ambulatory Visit: Payer: Self-pay | Admitting: Unknown Physician Specialty

## 2015-09-07 DIAGNOSIS — Z800 Family history of malignant neoplasm of digestive organs: Secondary | ICD-10-CM

## 2015-09-10 ENCOUNTER — Telehealth: Payer: Self-pay | Admitting: *Deleted

## 2015-09-10 NOTE — Telephone Encounter (Signed)
You sent my inserts back and I want to pick them up call me and let me know that they are back.

## 2015-09-15 ENCOUNTER — Ambulatory Visit: Payer: BLUE CROSS/BLUE SHIELD | Admitting: Podiatry

## 2015-09-20 ENCOUNTER — Ambulatory Visit
Admission: RE | Admit: 2015-09-20 | Discharge: 2015-09-20 | Disposition: A | Discharge status: Home / Self Care | Payer: BLUE CROSS/BLUE SHIELD | Source: Ambulatory Visit | Attending: Unknown Physician Specialty | Admitting: Unknown Physician Specialty

## 2015-09-20 DIAGNOSIS — Z800 Family history of malignant neoplasm of digestive organs: Secondary | ICD-10-CM

## 2015-09-22 ENCOUNTER — Encounter: Payer: Self-pay | Admitting: Podiatry

## 2015-09-22 ENCOUNTER — Ambulatory Visit (INDEPENDENT_AMBULATORY_CARE_PROVIDER_SITE_OTHER): Payer: BLUE CROSS/BLUE SHIELD | Admitting: Podiatry

## 2015-09-22 DIAGNOSIS — M722 Plantar fascial fibromatosis: Secondary | ICD-10-CM

## 2015-09-22 NOTE — Progress Notes (Signed)
[facility] has an unload at forefoot which is placed on the bottom. Patient is requesting unload on the top side of the orthotic rather than the bottom. Took pics of both orthotics and will send back for adjustment.

## 2015-12-27 ENCOUNTER — Encounter: Payer: Self-pay | Admitting: Podiatry

## 2015-12-27 ENCOUNTER — Ambulatory Visit (INDEPENDENT_AMBULATORY_CARE_PROVIDER_SITE_OTHER): Payer: BLUE CROSS/BLUE SHIELD

## 2015-12-27 ENCOUNTER — Ambulatory Visit (INDEPENDENT_AMBULATORY_CARE_PROVIDER_SITE_OTHER): Payer: BLUE CROSS/BLUE SHIELD | Admitting: Podiatry

## 2015-12-27 DIAGNOSIS — M79672 Pain in left foot: Secondary | ICD-10-CM | POA: Diagnosis not present

## 2015-12-27 DIAGNOSIS — M7742 Metatarsalgia, left foot: Secondary | ICD-10-CM

## 2015-12-27 DIAGNOSIS — M722 Plantar fascial fibromatosis: Secondary | ICD-10-CM | POA: Diagnosis not present

## 2015-12-27 NOTE — Progress Notes (Signed)
She presents today after tripping over a mat at work 3 weeks ago area she states the dorsal aspect of her left foot is painful and swollen and that her orthotics still uncomfortable.  Objective: Vital signs are stable she's alert and oriented 3. She is pain on palpation of the wart metatarsal of the left foot. Minimal edema no erythema saline straight odor. Radiographs do not demonstrate type of fracture to the fourth metatarsal. I see no signs of infection. Orthotics appear to be fitting normally. She needs to replace her shoes.  Assessment: Contusion dorsal aspect of the left foot.  Plan: Continue the use of her orthotics follow up with me as needed.

## 2016-01-20 ENCOUNTER — Encounter: Payer: Self-pay | Admitting: Family Medicine

## 2016-01-20 DIAGNOSIS — M069 Rheumatoid arthritis, unspecified: Secondary | ICD-10-CM | POA: Insufficient documentation

## 2016-02-03 DIAGNOSIS — R29898 Other symptoms and signs involving the musculoskeletal system: Secondary | ICD-10-CM | POA: Insufficient documentation

## 2016-02-09 ENCOUNTER — Ambulatory Visit: Payer: BLUE CROSS/BLUE SHIELD | Admitting: Podiatry

## 2016-06-22 DIAGNOSIS — M05761 Rheumatoid arthritis with rheumatoid factor of right knee without organ or systems involvement: Secondary | ICD-10-CM | POA: Insufficient documentation

## 2016-06-22 DIAGNOSIS — M05762 Rheumatoid arthritis with rheumatoid factor of left knee without organ or systems involvement: Secondary | ICD-10-CM

## 2016-07-04 ENCOUNTER — Encounter: Payer: Self-pay | Admitting: Family Medicine

## 2016-07-04 ENCOUNTER — Ambulatory Visit (INDEPENDENT_AMBULATORY_CARE_PROVIDER_SITE_OTHER): Payer: BLUE CROSS/BLUE SHIELD | Admitting: Family Medicine

## 2016-07-04 VITALS — BP 136/68 | HR 84 | Temp 97.5°F | Ht 65.0 in | Wt 181.0 lb

## 2016-07-04 DIAGNOSIS — M1711 Unilateral primary osteoarthritis, right knee: Secondary | ICD-10-CM

## 2016-07-04 DIAGNOSIS — Z01818 Encounter for other preprocedural examination: Secondary | ICD-10-CM | POA: Diagnosis not present

## 2016-07-04 LAB — CBC WITH DIFFERENTIAL/PLATELET
Basophils Absolute: 0 cells/uL (ref 0–200)
Basophils Relative: 0 %
Eosinophils Absolute: 75 cells/uL (ref 15–500)
Eosinophils Relative: 1 %
HCT: 37.6 % (ref 35.0–45.0)
Hemoglobin: 12.6 g/dL (ref 11.7–15.5)
Lymphocytes Relative: 29 %
Lymphs Abs: 2175 cells/uL (ref 850–3900)
MCH: 32.1 pg (ref 27.0–33.0)
MCHC: 33.5 g/dL (ref 32.0–36.0)
MCV: 95.7 fL (ref 80.0–100.0)
MPV: 10.7 fL (ref 7.5–12.5)
Monocytes Absolute: 525 cells/uL (ref 200–950)
Monocytes Relative: 7 %
Neutro Abs: 4725 cells/uL (ref 1500–7800)
Neutrophils Relative %: 63 %
Platelets: 285 10*3/uL (ref 140–400)
RBC: 3.93 MIL/uL (ref 3.80–5.10)
RDW: 13.4 % (ref 11.0–15.0)
WBC: 7.5 10*3/uL (ref 3.8–10.8)

## 2016-07-04 LAB — COMPLETE METABOLIC PANEL WITH GFR
ALT: 20 U/L (ref 6–29)
AST: 20 U/L (ref 10–35)
Albumin: 4.3 g/dL (ref 3.6–5.1)
Alkaline Phosphatase: 70 U/L (ref 33–130)
BUN: 15 mg/dL (ref 7–25)
CO2: 25 mmol/L (ref 20–31)
Calcium: 9.2 mg/dL (ref 8.6–10.4)
Chloride: 104 mmol/L (ref 98–110)
Creat: 0.59 mg/dL (ref 0.50–1.05)
GFR, Est African American: >89 mL/min (ref >=60)
GFR, Est Non African American: >89 mL/min (ref >=60)
Glucose, Bld: 108 mg/dL — ABNORMAL HIGH (ref 65–99)
Potassium: 3.9 mmol/L (ref 3.5–5.3)
Sodium: 138 mmol/L (ref 135–146)
Total Bilirubin: 1 mg/dL (ref 0.2–1.2)
Total Protein: 6.8 g/dL (ref 6.1–8.1)
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
## 2016-07-04 NOTE — Assessment & Plan Note (Signed)
Planning for total knee replacement; EKG done today; NSR, no significant ST-T wave changes; no cardiopulmonary disease; will check labs and then clear for surgery

## 2016-07-04 NOTE — Patient Instructions (Addendum)
We'll get labs today We'll send clearance to your orthopaedist once we see those lab results Check out the information at familydoctor.org entitled "Nutrition for Weight Loss: What You Need to Know about Fad Diets" Try to lose between 1-2 pounds per week by taking in fewer calories and burning off more calories You can succeed by limiting portions, limiting foods dense in calories and fat, becoming more active, and drinking 8 glasses of water a day (64 ounces) Don't skip meals, especially breakfast, as skipping meals may alter your metabolism Do not use over-the-counter weight loss pills or gimmicks that claim rapid weight loss A healthy BMI (or body mass index) is between 18.5 and 24.9 You can calculate your ideal BMI at the NIH website [employer]

## 2016-07-05 ENCOUNTER — Encounter: Payer: Self-pay | Admitting: Family Medicine

## 2016-07-05 LAB — APTT: aPTT: 23 s (ref 22–34)

## 2016-07-05 LAB — PROTIME-INR
INR: 0.9
Prothrombin Time: 9.7 s (ref 9.0–11.5)

## 2016-08-22 ENCOUNTER — Other Ambulatory Visit: Payer: Self-pay | Admitting: Rheumatology

## 2016-08-22 ENCOUNTER — Ambulatory Visit
Admission: RE | Admit: 2016-08-22 | Discharge: 2016-08-22 | Disposition: A | Discharge status: Home / Self Care | Payer: BLUE CROSS/BLUE SHIELD | Source: Ambulatory Visit | Attending: Rheumatology | Admitting: Rheumatology

## 2016-08-22 DIAGNOSIS — Z96651 Presence of right artificial knee joint: Secondary | ICD-10-CM

## 2016-08-22 DIAGNOSIS — M7989 Other specified soft tissue disorders: Secondary | ICD-10-CM | POA: Diagnosis not present

## 2016-08-22 DIAGNOSIS — M79661 Pain in right lower leg: Secondary | ICD-10-CM | POA: Diagnosis not present

## 2016-08-31 ENCOUNTER — Emergency Department
Admission: EM | Admit: 2016-08-31 | Discharge: 2016-09-01 | Disposition: A | Discharge status: Home / Self Care | Payer: BLUE CROSS/BLUE SHIELD | Attending: Emergency Medicine | Admitting: Emergency Medicine

## 2016-08-31 ENCOUNTER — Emergency Department: Payer: BLUE CROSS/BLUE SHIELD

## 2016-08-31 DIAGNOSIS — Z87891 Personal history of nicotine dependence: Secondary | ICD-10-CM | POA: Diagnosis not present

## 2016-08-31 DIAGNOSIS — R1084 Generalized abdominal pain: Secondary | ICD-10-CM | POA: Diagnosis present

## 2016-08-31 DIAGNOSIS — R197 Diarrhea, unspecified: Secondary | ICD-10-CM | POA: Insufficient documentation

## 2016-08-31 DIAGNOSIS — Z79899 Other long term (current) drug therapy: Secondary | ICD-10-CM | POA: Insufficient documentation

## 2016-08-31 DIAGNOSIS — R0781 Pleurodynia: Secondary | ICD-10-CM | POA: Diagnosis not present

## 2016-08-31 DIAGNOSIS — R079 Chest pain, unspecified: Secondary | ICD-10-CM

## 2016-08-31 DIAGNOSIS — R112 Nausea with vomiting, unspecified: Secondary | ICD-10-CM | POA: Diagnosis not present

## 2016-08-31 LAB — CBC
HCT: 40.5 % (ref 35.0–47.0)
Hemoglobin: 13.7 g/dL (ref 12.0–16.0)
MCH: 31.3 pg (ref 26.0–34.0)
MCHC: 33.8 g/dL (ref 32.0–36.0)
MCV: 92.8 fL (ref 80.0–100.0)
Platelets: 353 10*3/uL (ref 150–440)
RBC: 4.37 MIL/uL (ref 3.80–5.20)
RDW: 13.5 % (ref 11.5–14.5)
WBC: 11.2 10*3/uL — ABNORMAL HIGH (ref 3.6–11.0)

## 2016-08-31 LAB — COMPREHENSIVE METABOLIC PANEL
ALT: 24 U/L (ref 14–54)
AST: 30 U/L (ref 15–41)
Albumin: 4.4 g/dL (ref 3.5–5.0)
Alkaline Phosphatase: 66 U/L (ref 38–126)
Anion gap: 11 (ref 5–15)
BUN: 11 mg/dL (ref 6–20)
CO2: 22 mmol/L (ref 22–32)
Calcium: 9.4 mg/dL (ref 8.9–10.3)
Chloride: 104 mmol/L (ref 101–111)
Creatinine, Ser: 0.42 mg/dL — ABNORMAL LOW (ref 0.44–1.00)
GFR calc Af Amer: >60 mL/min (ref >60)
GFR calc non Af Amer: >60 mL/min (ref >60)
Glucose, Bld: 113 mg/dL — ABNORMAL HIGH (ref 65–99)
Potassium: 3.8 mmol/L (ref 3.5–5.1)
Sodium: 137 mmol/L (ref 135–145)
Total Bilirubin: 0.8 mg/dL (ref 0.3–1.2)
Total Protein: 7.5 g/dL (ref 6.5–8.1)

## 2016-08-31 LAB — URINALYSIS, COMPLETE (UACMP) WITH MICROSCOPIC
Bilirubin Urine: NEGATIVE
Glucose, UA: NEGATIVE mg/dL
Hgb urine dipstick: NEGATIVE
Ketones, ur: NEGATIVE mg/dL
Leukocytes, UA: NEGATIVE
Nitrite: NEGATIVE
Protein, ur: NEGATIVE mg/dL
Specific Gravity, Urine: 1.011 (ref 1.005–1.030)
pH: 7 (ref 5.0–8.0)

## 2016-08-31 LAB — LIPASE, BLOOD: Lipase: 32 U/L (ref 11–51)

## 2016-08-31 LAB — TROPONIN I: Troponin I: <0.03 ng/mL (ref <0.03)

## 2016-08-31 MED ORDER — ONDANSETRON HCL 4 MG/2ML IJ SOLN
4.0000 mg | Freq: Once | INTRAMUSCULAR | Status: AC
  Administered 2016-08-31: 4 mg via INTRAVENOUS
  Filled 2016-08-31: qty 2

## 2016-08-31 MED ORDER — HYDROMORPHONE HCL 1 MG/ML IJ SOLN
0.5000 mg | Freq: Once | INTRAMUSCULAR | Status: AC
  Administered 2016-08-31: 0.5 mg via INTRAVENOUS
  Filled 2016-08-31: qty 1

## 2016-08-31 MED ORDER — IOPAMIDOL (ISOVUE-370) INJECTION 76%
100.0000 mL | Freq: Once | INTRAVENOUS | Status: AC | PRN
  Administered 2016-08-31: 100 mL via INTRAVENOUS

## 2016-08-31 MED ORDER — IOPAMIDOL (ISOVUE-300) INJECTION 61%
30.0000 mL | Freq: Once | INTRAVENOUS | Status: AC
  Administered 2016-08-31: 30 mL via ORAL

## 2016-08-31 MED ORDER — SODIUM CHLORIDE 0.9 % IV BOLUS (SEPSIS)
1000.0000 mL | Freq: Once | INTRAVENOUS | Status: AC
  Administered 2016-08-31: 1000 mL via INTRAVENOUS

## 2016-08-31 NOTE — ED Notes (Signed)
Reviewed pt's chart & c/o; orders added per protocol

## 2016-08-31 NOTE — ED Notes (Signed)
Pt unable to tolerate second bottle of contrast, EDP notified, CT notified pt can go ahead to get CT scan having completed one bottle of contrast.

## 2016-08-31 NOTE — ED Provider Notes (Signed)
[facility] Emergency Department Provider Note  ____________________________________________  Time seen: Approximately 10:29 PM  I have reviewed the triage vital signs and the nursing notes.   HISTORY  Chief Complaint Abdominal Pain    HPI Amy Gregory is a 61 y.o. female with a history of GERD and hiatal hernia, diverticulitis, renal colic, status post right knee replacement 4 weeks ago, presenting withabdominal pain, chest pain, nausea and vomiting, and diarrhea.  The patient reports that 2 days ago she developed right and left upper abdominal pain that was worse with deep breaths, and associated with nausea and vomiting and diarrhea. Last diarrhea was this morning and this has resolved. She has not had any fever or chills, shortness breath.  She reports that she has had multiple previous episodes of "gallbladder pain" but has never been offered cholecystectomy "because they can never find anything."   Past Medical History:  Diagnosis Date  . Bilateral hearing loss   . Dysphagia   . GERD (gastroesophageal reflux disease)   . Hiatal hernia    small  . History of diverticulitis   . Impaired memory   . Nephrolithiasis   . OA (osteoarthritis)   . Rheumatoid arthritis (HCC)    managed by Dr. Gavin Potters    Patient Active Problem List   Diagnosis Date Noted  . Rheumatoid arthritis (HCC) 01/20/2016  . Increased urinary frequency 07/13/2015  . Vitamin D deficiency 07/13/2015  . Preventative health care 07/13/2015  . Osteopenia 07/13/2015  . Right upper quadrant abdominal pain 07/13/2015  . Bilateral hearing loss   . Dysphagia   . Impaired memory   . History of diverticulitis   . Arthritis of knee, degenerative 03/04/2015  . Gonalgia 02/18/2015  . Can't get food down 07/02/2014  . History of arthroscopy of knee 05/07/2014  . Calculus of kidney 10/02/2013  . Arthritis, degenerative 08/21/2013  . Elevated rheumatoid factor 08/21/2013    Past  Surgical History:  Procedure Laterality Date  . ABDOMINAL HYSTERECTOMY  2002   for fibroids, non cancerous reasons, ovaries remain  . BREAST BIOPSY Left 2005-ish   benign  . CATARACT EXTRACTION  2014   OU  . COLONOSCOPY  04/26/04  . ESOPHAGEAL DILATION    . ESOPHAGOGASTRODUODENOSCOPY  02/01/05 and 07/13/14  . KNEE SURGERY Right 03/2014   partial medial and lateral meniscectomy and medial condryle debridement  . LEG SURGERY  2013   fractured left leg, toes, hospitalized for 2 weeks  . ORIF TIBIA FRACTURE     remote ORIF proximal left tiba fracture    Current Outpatient Rx  . Order #: [id number] Class: Historical Med  . Order #: [id number] Class: Historical Med  . Order #: [id number] Class: Historical Med  . Order #: [id number] Class: Historical Med  . Order #: [id number] Class: Historical Med  . Order #: [id number] Class: Historical Med    Allergies Sulfa antibiotics and Cefdinir  Family History  Problem Relation Age of Onset  . Heart disease Mother   . Heart attack Mother   . Rheum arthritis Mother   . Stroke Mother   . Arthritis Mother     RA  . Hypertension Mother   . Stroke Father   . Diabetes Father   . Cancer Sister     lymphoma  . Heart attack Maternal Uncle   . Cancer Maternal Grandmother     leukemia  . Arthritis Maternal Grandmother   . Depression Maternal Grandfather   . Stroke Paternal Grandmother   . Cancer Paternal Grandfather   .  Arthritis Sister     rheumatitis  . Thyroid disease Sister   . Hypertension Sister   . Cancer Sister     lung  . Depression Sister     Social History Social History  Substance Use Topics  . Smoking status: Former Smoker    Quit date: 05/08/1969  . Smokeless tobacco: Never Used  . Alcohol use No    Review of Systems Constitutional: No fever/chills.  No lightheadedness or syncope. Eyes: No visual changes. ENT: No sore throat. No congestion or rhinorrhea. Cardiovascular: + chest pain. Denies palpitations. Respiratory:  Denies shortness of breath.  No cough. Gastrointestinal: Positive upper abdominal pain.  + nausea, + vomiting.  + diarrhea.  No constipation. Genitourinary: Negative for dysuria. Musculoskeletal: Negative for back pain. Skin: Negative for rash. Neurological: Negative for headaches. No focal numbness, tingling or weakness.   10-point ROS otherwise negative.  ____________________________________________   PHYSICAL EXAM:  VITAL SIGNS: ED Triage Vitals  Enc Vitals Group     BP 08/31/16 1844 (!) 152/67     Pulse Rate 08/31/16 1844 70     Resp 08/31/16 2149 (!) 22     Temp 08/31/16 1845 98.3 F (36.8 C)     Temp Source 08/31/16 1844 Oral     SpO2 08/31/16 1844 100 %     Weight 08/31/16 1844 175 lb (79.4 kg)     Height 08/31/16 1844 5\' 5"  (1.651 m)     Head Circumference --      Peak Flow --      Pain Score 08/31/16 1843 8     Pain Loc --      Pain Edu? --      Excl. in GC? --     Constitutitutional: Alert and oriented and answering questions properly. The patient is significantly uncomfortable and grabbing her upper abdomen and writhing in pain. Eyes: Conjunctivae are normal.  EOMI. No scleral icterus. Head: Atraumatic. Nose: No congestion/rhinnorhea. Mouth/Throat: Mucous membranes are moist.  Neck: No stridor.  Supple.  No meningismus. Cardiovascular: Normal rate, regular rhythm. No murmurs, rubs or gallops.  Respiratory: Normal respiratory effort.  No accessory muscle use or retractions. Lungs CTAB.  No wheezes, rales or ronchi. Gastrointestinal: Soft, and nondistended.  Diffusely tender in the entirety of the abdomen without focality. No guarding or rebound.  No peritoneal signs. Musculoskeletal: No LE edema. No ttp in the calves or palpable cords.  Negative Homan's sign. Patient has a well-healed surgical incision over the right patella. Neurologic:  A&Ox3.  Speech is clear.  Face and smile are symmetric.  EOMI.  Moves all extremities well. Skin:  Skin is warm, dry and  intact. No rash noted. Psychiatric: Mood and affect are normal. Speech and behavior are normal.  Normal judgement.  ____________________________________________   LABS (all labs ordered are listed, but only abnormal results are displayed)  Labs Reviewed  COMPREHENSIVE METABOLIC PANEL - Abnormal; Notable for the following:       Result Value   Glucose, Bld 113 (*)    Creatinine, Ser 0.42 (*)    All other components within normal limits  CBC - Abnormal; Notable for the following:    WBC 11.2 (*)    All other components within normal limits  URINALYSIS, COMPLETE (UACMP) WITH MICROSCOPIC - Abnormal; Notable for the following:    Color, Urine YELLOW (*)    APPearance CLEAR (*)    Bacteria, UA RARE (*)    Squamous Epithelial / LPF 0-5 (*)  All other components within normal limits  LIPASE, BLOOD  TROPONIN I   ____________________________________________  EKG  ED ECG REPORT I, Rockne Menghini, the attending physician, personally viewed and interpreted this ECG.   Date: 08/31/2016  EKG Time: 1926  Rate: 71  Rhythm: normal sinus rhythm  Axis: leftward  Intervals:none  ST&T Change: No STEMI  ____________________________________________  RADIOLOGY  No results found.  ____________________________________________   PROCEDURES  Procedure(s) performed: None  Procedures  Critical Care performed: No ____________________________________________   INITIAL IMPRESSION / ASSESSMENT AND PLAN / ED COURSE  Pertinent labs & imaging results that were available during my care of the patient were reviewed by me and considered in my medical decision making (see chart for details).  61 y.o. female presenting with diffuse severe abdominal pain associated with nausea vomiting and diarrhea, but also pleuritic lower chest pain in the setting of recent knee replacement. We'll get a CT abdomen to evaluate for intra-abdominal pathology, including gallbladder disease, appendicitis,  or obstruction although this is less likely since she is still having bowel movements. We'll also get a CT and a gram of the chest to evaluate for pulmonary embolus, given the patient's risk from recent surgery. Plan to initiate immediate symptomatic treatment, and reevaluate for final disposition.  ----------------------------------------- 10:52 PM on 08/31/2016 -----------------------------------------  The patient be signed out to Dr. Darnelle Catalan, the oncoming physician for final reevaluation and disposition.  ____________________________________________  FINAL CLINICAL IMPRESSION(S) / ED DIAGNOSES  Final diagnoses:  Generalized abdominal pain  Chest pain, unspecified type  Nausea vomiting and diarrhea         NEW MEDICATIONS STARTED DURING THIS VISIT:  New Prescriptions   No medications on file      Rockne Menghini, MD 08/31/16 2253

## 2016-08-31 NOTE — ED Notes (Signed)
Patient transported to CT 

## 2016-08-31 NOTE — ED Notes (Signed)
Attempted to start IV twice, unable to advance catheter both times. Consulting civil engineer notified.

## 2016-08-31 NOTE — ED Triage Notes (Signed)
Pt. States she is hurting on both sides of upper abdomin.  Nausea and diarrhea along with back pain in between shoulder blades.

## 2016-08-31 NOTE — ED Notes (Addendum)
Pt denies blood in stool at this time. Pt reports stabbing abd pain that comes and goes in the upper right and left quad. Pt reports hx of "gallbladder trouble off and on for years." Pt states pain "sometimes goes to my back." Pt denies CP or SHOB.  Pt denies using OTC medication for symptoms PTA.

## 2016-09-01 MED ORDER — CIPROFLOXACIN HCL 500 MG PO TABS: 500.0000 mg | ORAL_TABLET | Freq: Two times a day (BID) | ORAL | 0 refills | Status: AC

## 2016-09-01 MED ORDER — HYDROCODONE-ACETAMINOPHEN 5-325 MG PO TABS: 2.0000 | ORAL_TABLET | Freq: Four times a day (QID) | ORAL | 0 refills | Status: DC | PRN

## 2016-09-01 MED ORDER — METRONIDAZOLE 500 MG PO TABS: 500.0000 mg | ORAL_TABLET | Freq: Three times a day (TID) | ORAL | 0 refills | Status: AC

## 2016-09-01 NOTE — ED Provider Notes (Addendum)
Study Result   CLINICAL DATA:  61 year old female with history of GERD and hiatal hernia, diverticulitis and renal colic presents with abdominal and chest pain, nausea, vomiting and diarrhea.  EXAM: CT ANGIOGRAPHY CHEST  CT ABDOMEN AND PELVIS WITH CONTRAST  TECHNIQUE: Multidetector CT imaging of the chest was performed using the standard protocol during bolus administration of intravenous contrast. Multiplanar CT image reconstructions and MIPs were obtained to evaluate the vascular anatomy. Multidetector CT imaging of the abdomen and pelvis was performed using the standard protocol during bolus administration of intravenous contrast.  CONTRAST:  100 cc Isovue 370 IV  COMPARISON:  07/15/2014 CT abdomen and pelvis  FINDINGS: CTA CHEST FINDINGS  Cardiovascular: The study is of quality for the evaluation of pulmonary embolism. There are no filling defects in the central, lobar, segmental or subsegmental pulmonary artery branches to suggest acute pulmonary embolism. Great vessels are normal in course and caliber. Normal heart size. No significant pericardial fluid/thickening.  Mediastinum/Nodes: No discrete thyroid nodules. Unremarkable esophagus. No pathologically enlarged axillary, mediastinal or hilar lymph nodes. No thoracic aortic aneurysm or dissection.  Lungs/Pleura: No pneumothorax. No pleural effusion. Bibasilar dependent atelectasis is noted.  Upper abdomen: Unremarkable.  Musculoskeletal:  No aggressive appearing focal osseous lesions.  Review of the MIP images confirms the above findings.  CT ABDOMEN and PELVIS FINDINGS  Hepatobiliary: No focal liver abnormality is seen. No gallstones, gallbladder wall thickening, or biliary dilatation.  Pancreas: Unremarkable. No pancreatic ductal dilatation or surrounding inflammatory changes.  Spleen: Normal in size without focal abnormality.  Adrenals/Urinary Tract: Adrenal glands are unremarkable.  Kidneys are normal, without renal calculi, focal lesion, or hydronephrosis. Bladder is unremarkable.  Stomach/Bowel: Contrast distended stomach was normal small bowel rotation. Abnormal transmural thickening of mid to distal ileum and from cecum through transverse colon consistent with an ileocolitis. No bowel obstruction or perforation.  Vascular/Lymphatic: No significant vascular findings are present. No enlarged abdominal or pelvic lymph nodes.  Reproductive: Hysterectomy.  No adnexal mass.  Other: No abdominal wall hernia or abnormality. No pneumoperitoneum. Small amount of free fluid in the pelvis.  Musculoskeletal: No acute or significant osseous findings.  Review of the MIP images confirms the above findings.  IMPRESSION: 1. No acute cardiopulmonary disease.  No pulmonary embolus. 2. Inflammatory thickening of mid to distal ileal loops and colon as above described consistent with acute ileocolitis suspicious for inflammatory bowel disease.   Electronically Signed   By: Tollie Eth M.D.   On: 09/01/2016 00:13    Discussed with GI on-call at [facility] as we have no GI on call here. We will have GI on the 28th. Discussed also w/ Hospitalist. He wishes to let patient have outpatient follow up. Patient  herself also wishes outpatient follow up and not admission. Pt appears stable enough to do so. She will return if worse.    Arnaldo Natal, MD 09/01/16 [id number]    Arnaldo Natal, MD 09/01/16 [phone]

## 2016-09-01 NOTE — Discharge Instructions (Signed)
Please take the Cipro and Flagyl antibiotics. They may help with the inflammation. Please call Dr.Wohl the gastroenterologist on Monday. Tell the office you were in the ER and were told to call for an appointment very soon.  He will follow-up with you.  Please return for weakness, fever, worse pain worse diarrhea or not keeping down fluids.

## 2016-09-15 ENCOUNTER — Encounter: Payer: Self-pay | Admitting: Podiatry

## 2016-09-15 ENCOUNTER — Ambulatory Visit (INDEPENDENT_AMBULATORY_CARE_PROVIDER_SITE_OTHER): Payer: BLUE CROSS/BLUE SHIELD | Admitting: Podiatry

## 2016-09-15 ENCOUNTER — Ambulatory Visit (INDEPENDENT_AMBULATORY_CARE_PROVIDER_SITE_OTHER): Payer: BLUE CROSS/BLUE SHIELD

## 2016-09-15 DIAGNOSIS — S99921A Unspecified injury of right foot, initial encounter: Secondary | ICD-10-CM | POA: Diagnosis not present

## 2016-09-15 DIAGNOSIS — S92414A Nondisplaced fracture of proximal phalanx of right great toe, initial encounter for closed fracture: Secondary | ICD-10-CM

## 2016-09-15 MED ORDER — SULFAMETHOXAZOLE-TRIMETHOPRIM 400-80 MG PO TABS: 1.0000 | ORAL_TABLET | Freq: Two times a day (BID) | ORAL | 0 refills | Status: DC

## 2016-09-18 NOTE — Progress Notes (Signed)
   HPI: Patient is a 61 year old female presenting today with a complaint of bruising and blistering of the right great toe. He states he fell and hit the toe on a sofa yesterday. He then reports that he dropped a can of soda on the toe. He is here for further evaluation and treatment. He reports history of recent right knee surgery.    Physical Exam: General: The patient is alert and oriented x3 in no acute distress.  Dermatology: Hemorrhagic fracture blister with ecchymosis to the right great toe. Skin is warm, dry and supple bilateral lower extremities.   Vascular: Palpable pedal pulses bilaterally. No edema or erythema noted. Capillary refill within normal limits.  Neurological: Epicritic and protective threshold grossly intact bilaterally.   Musculoskeletal Exam: Pain with palpation of right great toe. Range of motion within normal limits to all pedal and ankle joints bilateral. Muscle strength 5/5 in all groups bilateral.   Radiographic Exam:  Nondisplaced, transverse fracture of the proximal phalanx of right great toe.  Assessment: 1. Right great toe with proximal phalanx fracture   Plan of Care:  1. Patient was evaluated. X-rays reviewed. 2. Dressings applied. 3. Postop shoe dispensed. 4. Prescription for Bactrim DS # 20 given. 5. Return to clinic in 4 weeks.   Felecia Shelling, DPM [facility]  Dr. Felecia Shelling, DPM    [address]                                        [place], [place] [address]                Office [phone]  Fax [phone]

## 2016-10-13 ENCOUNTER — Ambulatory Visit (INDEPENDENT_AMBULATORY_CARE_PROVIDER_SITE_OTHER): Payer: BLUE CROSS/BLUE SHIELD | Admitting: Podiatry

## 2016-10-13 ENCOUNTER — Ambulatory Visit (INDEPENDENT_AMBULATORY_CARE_PROVIDER_SITE_OTHER): Payer: BLUE CROSS/BLUE SHIELD

## 2016-10-13 DIAGNOSIS — S92414A Nondisplaced fracture of proximal phalanx of right great toe, initial encounter for closed fracture: Secondary | ICD-10-CM

## 2016-10-15 NOTE — Progress Notes (Signed)
   HPI: Patient is a 61 year old female presenting today for follow-up evaluation of a fracture to the right great toe. He states his pain has improved. He denies any further complaints at this time.   Physical Exam: General: The patient is alert and oriented x3 in no acute distress.  Dermatology: Hemorrhagic fracture blister with ecchymosis to the right great toe. Skin is warm, dry and supple bilateral lower extremities.   Vascular: Palpable pedal pulses bilaterally. No edema or erythema noted. Capillary refill within normal limits.  Neurological: Epicritic and protective threshold grossly intact bilaterally.   Musculoskeletal Exam: Pain with palpation of right great toe. Range of motion within normal limits to all pedal and ankle joints bilateral. Muscle strength 5/5 in all groups bilateral.   Radiographic Exam:  Nondisplaced, transverse fracture  with routine healing of the proximal phalanx of right great toe.  Assessment: 1. Right great toe with proximal phalanx fracture  Plan of Care:  1. Patient was evaluated. X-rays reviewed. 2. Discontinue wearing postop shoe. 3. May wear regular shoe gear. 4. Return to clinic when necessary.   Felecia Shelling, DPM [facility]  Dr. Felecia Shelling, DPM    [address]                                        [place], [place] [address]                Office [phone]  Fax [phone]

## 2016-10-16 ENCOUNTER — Emergency Department: Payer: BLUE CROSS/BLUE SHIELD

## 2016-10-16 ENCOUNTER — Inpatient Hospital Stay
Admission: EM | Admit: 2016-10-16 | Discharge: 2016-10-19 | DRG: 481 | Disposition: A | Discharge status: Skilled Nursing Facility | Payer: BLUE CROSS/BLUE SHIELD | Attending: Internal Medicine | Admitting: Internal Medicine

## 2016-10-16 DIAGNOSIS — K219 Gastro-esophageal reflux disease without esophagitis: Secondary | ICD-10-CM | POA: Diagnosis present

## 2016-10-16 DIAGNOSIS — Y92000 Kitchen of unspecified non-institutional (private) residence as  the place of occurrence of the external cause: Secondary | ICD-10-CM | POA: Diagnosis not present

## 2016-10-16 DIAGNOSIS — W010XXA Fall on same level from slipping, tripping and stumbling without subsequent striking against object, initial encounter: Secondary | ICD-10-CM | POA: Diagnosis present

## 2016-10-16 DIAGNOSIS — K449 Diaphragmatic hernia without obstruction or gangrene: Secondary | ICD-10-CM | POA: Diagnosis present

## 2016-10-16 DIAGNOSIS — S72341A Displaced spiral fracture of shaft of right femur, initial encounter for closed fracture: Secondary | ICD-10-CM | POA: Diagnosis present

## 2016-10-16 DIAGNOSIS — Z96651 Presence of right artificial knee joint: Secondary | ICD-10-CM | POA: Diagnosis present

## 2016-10-16 DIAGNOSIS — D62 Acute posthemorrhagic anemia: Secondary | ICD-10-CM | POA: Diagnosis not present

## 2016-10-16 DIAGNOSIS — Z8261 Family history of arthritis: Secondary | ICD-10-CM | POA: Diagnosis not present

## 2016-10-16 DIAGNOSIS — Z807 Family history of other malignant neoplasms of lymphoid, hematopoietic and related tissues: Secondary | ICD-10-CM

## 2016-10-16 DIAGNOSIS — S72141A Displaced intertrochanteric fracture of right femur, initial encounter for closed fracture: Secondary | ICD-10-CM | POA: Diagnosis not present

## 2016-10-16 DIAGNOSIS — W19XXXA Unspecified fall, initial encounter: Secondary | ICD-10-CM

## 2016-10-16 DIAGNOSIS — H9193 Unspecified hearing loss, bilateral: Secondary | ICD-10-CM | POA: Diagnosis present

## 2016-10-16 DIAGNOSIS — Z87891 Personal history of nicotine dependence: Secondary | ICD-10-CM

## 2016-10-16 DIAGNOSIS — M069 Rheumatoid arthritis, unspecified: Secondary | ICD-10-CM | POA: Diagnosis present

## 2016-10-16 DIAGNOSIS — S72001A Fracture of unspecified part of neck of right femur, initial encounter for closed fracture: Secondary | ICD-10-CM

## 2016-10-16 DIAGNOSIS — Z419 Encounter for procedure for purposes other than remedying health state, unspecified: Secondary | ICD-10-CM

## 2016-10-16 DIAGNOSIS — Z806 Family history of leukemia: Secondary | ICD-10-CM | POA: Diagnosis not present

## 2016-10-16 LAB — BASIC METABOLIC PANEL
Anion gap: 9 (ref 5–15)
BUN: 14 mg/dL (ref 6–20)
CO2: 26 mmol/L (ref 22–32)
Calcium: 9 mg/dL (ref 8.9–10.3)
Chloride: 103 mmol/L (ref 101–111)
Creatinine, Ser: 0.54 mg/dL (ref 0.44–1.00)
GFR calc Af Amer: >60 mL/min (ref >60)
GFR calc non Af Amer: >60 mL/min (ref >60)
Glucose, Bld: 127 mg/dL — ABNORMAL HIGH (ref 65–99)
Potassium: 3.9 mmol/L (ref 3.5–5.1)
Sodium: 138 mmol/L (ref 135–145)

## 2016-10-16 LAB — CBC
HCT: 35.6 % (ref 35.0–47.0)
Hemoglobin: 12.2 g/dL (ref 12.0–16.0)
MCH: 30.9 pg (ref 26.0–34.0)
MCHC: 34.1 g/dL (ref 32.0–36.0)
MCV: 90.5 fL (ref 80.0–100.0)
Platelets: 255 10*3/uL (ref 150–440)
RBC: 3.94 MIL/uL (ref 3.80–5.20)
RDW: 13.6 % (ref 11.5–14.5)
WBC: 12 10*3/uL — ABNORMAL HIGH (ref 3.6–11.0)

## 2016-10-16 LAB — TYPE AND SCREEN
ABO/RH(D): A NEG
Antibody Screen: NEGATIVE

## 2016-10-16 LAB — PROTIME-INR
INR: 0.91
Prothrombin Time: 12.2 seconds (ref 11.4–15.2)

## 2016-10-16 LAB — SURGICAL PCR SCREEN
MRSA, PCR: NEGATIVE
Staphylococcus aureus: NEGATIVE

## 2016-10-16 LAB — APTT: aPTT: 24 seconds (ref 24–36)

## 2016-10-16 MED ORDER — ONDANSETRON HCL 4 MG PO TABS: 4.0000 mg | ORAL_TABLET | Freq: Four times a day (QID) | ORAL | Status: DC | PRN

## 2016-10-16 MED ORDER — ACETAMINOPHEN 325 MG PO TABS: 650.0000 mg | ORAL_TABLET | Freq: Four times a day (QID) | ORAL | Status: DC | PRN

## 2016-10-16 MED ORDER — FOLIC ACID 1 MG PO TABS
1.0000 mg | ORAL_TABLET | Freq: Every day | ORAL | Status: DC
  Administered 2016-10-18 – 2016-10-19 (×2): 1 mg via ORAL
  Filled 2016-10-16 (×2): qty 1

## 2016-10-16 MED ORDER — ACETAMINOPHEN 650 MG RE SUPP: 650.0000 mg | Freq: Four times a day (QID) | RECTAL | Status: DC | PRN

## 2016-10-16 MED ORDER — HYDROCODONE-ACETAMINOPHEN 5-325 MG PO TABS
1.0000 | ORAL_TABLET | ORAL | Status: DC | PRN
  Administered 2016-10-16 – 2016-10-17 (×3): 1 via ORAL
  Filled 2016-10-16 (×2): qty 1

## 2016-10-16 MED ORDER — SODIUM CHLORIDE 0.9 % IV SOLN
Freq: Once | INTRAVENOUS | Status: AC
  Administered 2016-10-16: 1000 mL via INTRAVENOUS

## 2016-10-16 MED ORDER — METHOTREXATE 2.5 MG PO TABS: 15.0000 mg | ORAL_TABLET | ORAL | Status: DC

## 2016-10-16 MED ORDER — MORPHINE SULFATE (PF) 2 MG/ML IV SOLN
2.0000 mg | INTRAVENOUS | Status: DC | PRN
  Administered 2016-10-16 (×2): 2 mg via INTRAVENOUS
  Filled 2016-10-16 (×2): qty 1

## 2016-10-16 MED ORDER — MORPHINE SULFATE (PF) 4 MG/ML IV SOLN
4.0000 mg | Freq: Once | INTRAVENOUS | Status: AC
  Administered 2016-10-16: 4 mg via INTRAVENOUS

## 2016-10-16 MED ORDER — PANTOPRAZOLE SODIUM 40 MG PO TBEC
40.0000 mg | DELAYED_RELEASE_TABLET | Freq: Every day | ORAL | Status: DC
  Administered 2016-10-17 – 2016-10-19 (×3): 40 mg via ORAL
  Filled 2016-10-16 (×3): qty 1

## 2016-10-16 MED ORDER — CLINDAMYCIN PHOSPHATE 900 MG/50ML IV SOLN
900.0000 mg | Freq: Once | INTRAVENOUS | Status: DC
  Filled 2016-10-16: qty 50

## 2016-10-16 MED ORDER — ONDANSETRON HCL 4 MG/2ML IJ SOLN: 4.0000 mg | Freq: Four times a day (QID) | INTRAMUSCULAR | Status: DC | PRN

## 2016-10-16 MED ORDER — MORPHINE SULFATE (PF) 2 MG/ML IV SOLN
2.0000 mg | INTRAVENOUS | Status: DC | PRN
  Administered 2016-10-16 – 2016-10-17 (×3): 2 mg via INTRAVENOUS
  Filled 2016-10-16 (×3): qty 1

## 2016-10-16 MED ORDER — ALBUTEROL SULFATE (2.5 MG/3ML) 0.083% IN NEBU
2.5000 mg | INHALATION_SOLUTION | RESPIRATORY_TRACT | Status: DC | PRN
Start: 2016-10-16 — End: 2016-10-19

## 2016-10-16 MED ORDER — DOCUSATE SODIUM 100 MG PO CAPS
100.0000 mg | ORAL_CAPSULE | Freq: Two times a day (BID) | ORAL | Status: DC
  Administered 2016-10-16 – 2016-10-19 (×5): 100 mg via ORAL
  Filled 2016-10-16 (×5): qty 1

## 2016-10-16 MED ORDER — MORPHINE SULFATE (PF) 4 MG/ML IV SOLN
INTRAVENOUS | Status: AC
  Administered 2016-10-16: 4 mg via INTRAVENOUS
  Filled 2016-10-16: qty 1

## 2016-10-16 MED ORDER — HYDROCODONE-ACETAMINOPHEN 5-325 MG PO TABS
ORAL_TABLET | ORAL | Status: AC
  Administered 2016-10-17: 1 via ORAL
  Filled 2016-10-16: qty 1

## 2016-10-16 MED ORDER — POLYETHYLENE GLYCOL 3350 17 G PO PACK
17.0000 g | PACK | Freq: Every day | ORAL | Status: DC | PRN
  Administered 2016-10-19: 17 g via ORAL
  Filled 2016-10-16: qty 1

## 2016-10-16 NOTE — H&P (Signed)
[employer] - [place] [facility]   PATIENT NAME: Amy Gregory    MR#:  [id number]  DATE OF BIRTH:  1956/03/03  DATE OF ADMISSION:  10/16/2016  PRIMARY CARE PHYSICIAN: Kerman Passey, MD   REQUESTING/REFERRING PHYSICIAN: Dr. Don Perking  CHIEF COMPLAINT:   Chief Complaint  Patient presents with  . Fall    HISTORY OF PRESENT ILLNESS:  Amy Gregory  is a 61 y.o. female with a known history of Rheumatoid arthritis, GERD presents to the emergency room after she tripped and fell landing on right hip. Should significant pain and presented to the emergency room. Here she has a right femur intertrochanteric fracture. The leg has been found to be shortened and externally rotated. Orthopedics consulted. Patient going to the operating room tomorrow morning. EKG has been done and is normal. She had right knee replacement and right toe surgery in the recent past where she tolerated it well and had no complications with anesthesia. Good functional status.  PAST MEDICAL HISTORY:   Past Medical History:  Diagnosis Date  . Bilateral hearing loss   . Dysphagia   . GERD (gastroesophageal reflux disease)   . Hiatal hernia    small  . History of diverticulitis   . Impaired memory   . Nephrolithiasis   . OA (osteoarthritis)   . Rheumatoid arthritis (HCC)    managed by Dr. Gavin Potters    PAST SURGICAL HISTORY:   Past Surgical History:  Procedure Laterality Date  . ABDOMINAL HYSTERECTOMY  2002   for fibroids, non cancerous reasons, ovaries remain  . BREAST BIOPSY Left 2005-ish   benign  . CATARACT EXTRACTION  2014   OU  . COLONOSCOPY  04/26/04  . ESOPHAGEAL DILATION    . ESOPHAGOGASTRODUODENOSCOPY  02/01/05 and 07/13/14  . KNEE SURGERY Right 03/2014   partial medial and lateral meniscectomy and medial condryle debridement  . LEG SURGERY  2013   fractured left leg, toes, hospitalized for 2 weeks  . ORIF TIBIA FRACTURE     remote ORIF proximal left tiba fracture     SOCIAL HISTORY:   Social History  Substance Use Topics  . Smoking status: Former Smoker    Quit date: 05/08/1969  . Smokeless tobacco: Never Used  . Alcohol use No    FAMILY HISTORY:   Family History  Problem Relation Age of Onset  . Heart disease Mother   . Heart attack Mother   . Rheum arthritis Mother   . Stroke Mother   . Arthritis Mother        RA  . Hypertension Mother   . Stroke Father   . Diabetes Father   . Cancer Sister        lymphoma  . Heart attack Maternal Uncle   . Cancer Maternal Grandmother        leukemia  . Arthritis Maternal Grandmother   . Depression Maternal Grandfather   . Stroke Paternal Grandmother   . Cancer Paternal Grandfather   . Arthritis Sister        rheumatitis  . Thyroid disease Sister   . Hypertension Sister   . Cancer Sister        lung  . Depression Sister     DRUG ALLERGIES:   Allergies  Allergen Reactions  . Sulfa Antibiotics   . Cefdinir Rash    REVIEW OF SYSTEMS:   Review of Systems  Constitutional: Positive for malaise/fatigue. Negative for chills, fever and weight loss.  HENT: Negative for hearing loss  and nosebleeds.   Eyes: Negative for blurred vision, double vision and pain.  Respiratory: Negative for cough, hemoptysis, sputum production, shortness of breath and wheezing.   Cardiovascular: Negative for chest pain, palpitations, orthopnea and leg swelling.  Gastrointestinal: Negative for abdominal pain, constipation, diarrhea, nausea and vomiting.  Genitourinary: Negative for dysuria and hematuria.  Musculoskeletal: Positive for falls and joint pain. Negative for back pain and myalgias.  Skin: Negative for rash.  Neurological: Negative for dizziness, tremors, sensory change, speech change, focal weakness, seizures and headaches.  Endo/Heme/Allergies: Does not bruise/bleed easily.  Psychiatric/Behavioral: Negative for depression and memory loss. The patient is not nervous/anxious.     MEDICATIONS AT  HOME:   Prior to Admission medications   Medication Sig Start Date End Date Taking? Authorizing Provider  Ascorbic Acid (VITAMIN C) 100 MG tablet Take 100 mg by mouth daily.   Yes [provider]  diphenhydramine-acetaminophen (TYLENOL PM) 25-500 MG TABS tablet Take by mouth.   Yes [provider]  folic acid (FOLVITE) 1 MG tablet TAKE 1 TABLET BY MOUTH ONCE DAILY 07/03/16  Yes [provider]  methotrexate (RHEUMATREX) 2.5 MG tablet Take 15 mg by mouth once a week. Take 6  2.5mg  pills once a week. 06/25/15  Yes [provider]  Multiple Vitamin (MULTIVITAMIN) capsule Take by mouth.   Yes [provider]  pantoprazole (PROTONIX) 40 MG tablet TAKE 1 TABLET BY MOUTH ONCE DAILY. 05/12/15  Yes [provider]  sulfamethoxazole-trimethoprim (BACTRIM) 400-80 MG tablet Take 1 tablet by mouth 2 (two) times daily. Patient not taking: Reported on 10/16/2016 09/15/16   Felecia Shelling, DPM     VITAL SIGNS:  Blood pressure (!) 163/71, pulse 88, temperature 97.8 F (36.6 C), temperature source Oral, resp. rate 20, weight 78.9 kg (174 lb), SpO2 98 %.  PHYSICAL EXAMINATION:  Physical Exam  GENERAL:  61 y.o.-year-old patient lying in the bed , In tears due to pain EYES: Pupils equal, round, reactive to light and accommodation. No scleral icterus. Extraocular muscles intact.  HEENT: Head atraumatic, normocephalic. Oropharynx and nasopharynx clear. No oropharyngeal erythema, moist oral mucosa  NECK:  Supple, no jugular venous distention. No thyroid enlargement, no tenderness.  LUNGS: Normal breath sounds bilaterally, no wheezing, rales, rhonchi. No use of accessory muscles of respiration.  CARDIOVASCULAR: S1, S2 normal. No murmurs, rubs, or gallops.  ABDOMEN: Soft, nontender, nondistended. Bowel sounds present. No organomegaly or mass.  EXTREMITIES: Right leg shortened and externally rotated. NEUROLOGIC: Cranial nerves II through XII are intact. No focal  Motor or sensory deficits appreciated b/l PSYCHIATRIC: The patient is alert and oriented x 3. Good affect.  SKIN: No obvious rash, lesion, or ulcer.   LABORATORY PANEL:   CBC  Recent Labs Lab 10/16/16 1719  WBC 12.0*  HGB 12.2  HCT 35.6  PLT 255   ------------------------------------------------------------------------------------------------------------------  Chemistries   Recent Labs Lab 10/16/16 1719  NA 138  K 3.9  CL 103  CO2 26  GLUCOSE 127*  BUN 14  CREATININE 0.54  CALCIUM 9.0   ------------------------------------------------------------------------------------------------------------------  Cardiac Enzymes No results for input(s): TROPONINI in the last 168 hours. ------------------------------------------------------------------------------------------------------------------  RADIOLOGY:  Dg Chest 1 View  Result Date: 10/16/2016 CLINICAL DATA:  Right hip pain after fall today. EXAM: CHEST 1 VIEW COMPARISON:  12/20/2011 FINDINGS: Lungs are adequately inflated and otherwise clear. Cardiomediastinal silhouette is within normal. Remaining bones and soft tissues are normal. IMPRESSION: No active disease. Electronically Signed   By: Elberta Fortis M.D.  On: 10/16/2016 17:29   Dg Hip Unilat  With Pelvis 2-3 Views Right  Result Date: 10/16/2016 CLINICAL DATA:  Right hip pain after fall today. EXAM: DG HIP (WITH OR WITHOUT PELVIS) 2-3V RIGHT COMPARISON:  CT 08/31/2016 FINDINGS: There is mild diffuse osteopenia. Mild symmetric degenerative change of the hips. There is a displaced intertrochanteric fracture of the right femoral neck. Mild degenerative change of the spine. IMPRESSION: Displaced right femoral intertrochanteric fracture. Electronically Signed   By: Elberta Fortis M.D.   On: 10/16/2016 17:28     IMPRESSION AND PLAN:   * Right intertrochanteric femur fracture Admit to medical floor. Start diet. Nothing by mouth after midnight for surgery in the morning.  Consult orthopedics. Low risk for surgery. Medications added. DVT prophylaxis with SCDs at this time. Lovenox to be added by orthopedics. Surgery. Consult physical therapy after surgery. Likely need rehabilitation.  * GERD. On Protonix.  All the records are reviewed and case discussed with ED provider. Management plans discussed with the patient, family and they are in agreement.  CODE STATUS: FULL CODE  TOTAL TIME TAKING CARE OF THIS PATIENT: 40 minutes.   Milagros Loll R M.D on 10/16/2016 at 6:54 PM  Between 7am to 6pm - Pager - [phone]  After 6pm go to [email or web address] - password [email or web address] [facility]  [facility] [facility]  Office  [phone]  CC: Primary care physician; Kerman Passey, MD  Note: This dictation was prepared with Dragon dictation along with smaller phrase technology. Any transcriptional errors that result from this process are unintentional.

## 2016-10-16 NOTE — ED Triage Notes (Signed)
Trip and fall at home. Right hip pain shortening and rotation upon arrival. fentanyl and 4mg  zofran given IV by [facility]. 20 G to L forearm by [facility]. No LOC or head trauma.

## 2016-10-16 NOTE — ED Provider Notes (Signed)
[facility] Emergency Department Provider Note  ____________________________________________  Time seen: Approximately 5:39 PM  I have reviewed the triage vital signs and the nursing notes.   HISTORY  Chief Complaint Fall   HPI Amy Gregory Amy Gregory is a 61 y.o. female with a history of rheumatoid arthritisand R total knee replacement on 07/2016 by Dr. Gavin Potters who presents for evaluation of right hip pain. Patient reports that she was in her kitchen when she tripped and fell on top of her right hip. She was unable to ambulate. She is complaining of severe pain located in her right hip, constant and nonradiating since the fall. She denies head trauma. She is not in any blood thinners. No LOC. No neck pain or back pain, no chest pain or abdominal pain. No pain in any other extremity  Past Medical History:  Diagnosis Date  . Bilateral hearing loss   . Dysphagia   . GERD (gastroesophageal reflux disease)   . Hiatal hernia    small  . History of diverticulitis   . Impaired memory   . Nephrolithiasis   . OA (osteoarthritis)   . Rheumatoid arthritis (HCC)    managed by Dr. Gavin Potters    Patient Active Problem List   Diagnosis Date Noted  . Rheumatoid arthritis (HCC) 01/20/2016  . Increased urinary frequency 07/13/2015  . Vitamin D deficiency 07/13/2015  . Preventative health care 07/13/2015  . Osteopenia 07/13/2015  . Right upper quadrant abdominal pain 07/13/2015  . Bilateral hearing loss   . Dysphagia   . Impaired memory   . History of diverticulitis   . Arthritis of knee, degenerative 03/04/2015  . Gonalgia 02/18/2015  . Can't get food down 07/02/2014  . History of arthroscopy of knee 05/07/2014  . Calculus of kidney 10/02/2013  . Arthritis, degenerative 08/21/2013  . Elevated rheumatoid factor 08/21/2013    Past Surgical History:  Procedure Laterality Date  . ABDOMINAL HYSTERECTOMY  2002   for fibroids, non cancerous reasons, ovaries remain    . BREAST BIOPSY Left 2005-ish   benign  . CATARACT EXTRACTION  2014   OU  . COLONOSCOPY  04/26/04  . ESOPHAGEAL DILATION    . ESOPHAGOGASTRODUODENOSCOPY  02/01/05 and 07/13/14  . KNEE SURGERY Right 03/2014   partial medial and lateral meniscectomy and medial condryle debridement  . LEG SURGERY  2013   fractured left leg, toes, hospitalized for 2 weeks  . ORIF TIBIA FRACTURE     remote ORIF proximal left tiba fracture    Prior to Admission medications   Medication Sig Start Date End Date Taking? Authorizing Provider  Ascorbic Acid (VITAMIN C) 100 MG tablet Take 100 mg by mouth daily.    [provider]  diphenhydramine-acetaminophen (TYLENOL PM) 25-500 MG TABS tablet Take by mouth.    [provider]  folic acid (FOLVITE) 1 MG tablet TAKE 1 TABLET BY MOUTH ONCE DAILY 07/03/16   [provider]  methotrexate (RHEUMATREX) 2.5 MG tablet Take 15 mg by mouth once a week. Take 6  2.5mg  pills once a week. 06/25/15   [provider]  Multiple Vitamin (MULTIVITAMIN) capsule Take by mouth.    [provider]  pantoprazole (PROTONIX) 40 MG tablet TAKE 1 TABLET BY MOUTH ONCE DAILY. 05/12/15   [provider]  sulfamethoxazole-trimethoprim (BACTRIM) 400-80 MG tablet Take 1 tablet by mouth 2 (two) times daily. 09/15/16   Felecia Shelling, DPM    Allergies Sulfa antibiotics and Cefdinir  Family History  Problem  Relation Age of Onset  . Heart disease Mother   . Heart attack Mother   . Rheum arthritis Mother   . Stroke Mother   . Arthritis Mother        RA  . Hypertension Mother   . Stroke Father   . Diabetes Father   . Cancer Sister        lymphoma  . Heart attack Maternal Uncle   . Cancer Maternal Grandmother        leukemia  . Arthritis Maternal Grandmother   . Depression Maternal Grandfather   . Stroke Paternal Grandmother   . Cancer Paternal Grandfather   . Arthritis Sister        rheumatitis  . Thyroid disease Sister   .  Hypertension Sister   . Cancer Sister        lung  . Depression Sister     Social History Social History  Substance Use Topics  . Smoking status: Former Smoker    Quit date: 05/08/1969  . Smokeless tobacco: Never Used  . Alcohol use No    Review of Systems Constitutional: Negative for fever. Eyes: Negative for visual changes. ENT: Negative for facial injury or neck injury Cardiovascular: Negative for chest injury. Respiratory: Negative for shortness of breath. Negative for chest wall injury. Gastrointestinal: Negative for abdominal pain or injury. Genitourinary: Negative for dysuria. Musculoskeletal: Negative for back injury, + R hip pain Skin: Negative for laceration/abrasions. Neurological: Negative for head injury.   ____________________________________________   PHYSICAL EXAM:  VITAL SIGNS: ED Triage Vitals  Enc Vitals Group     BP 10/16/16 1552 (!) 187/96     Pulse Rate 10/16/16 1552 79     Resp 10/16/16 1552 (!) 22     Temp 10/16/16 1552 97.8 F (36.6 C)     Temp Source 10/16/16 1552 Oral     SpO2 10/16/16 1552 93 %     Weight 10/16/16 1550 174 lb (78.9 kg)     Height --      Head Circumference --      Peak Flow --      Pain Score 10/16/16 1549 10     Pain Loc --      Pain Edu? --      Excl. in GC? --    Full spinal precautions maintained throughout the trauma exam. Constitutional: Alert and oriented. No acute distress. Does not appear intoxicated. HEENT Head: Normocephalic and atraumatic. Face: No facial bony tenderness. Stable midface Ears: No hemotympanum bilaterally. No Battle sign Eyes: No eye injury. PERRL. No raccoon eyes Nose: Nontender. No epistaxis. No rhinorrhea Mouth/Throat: Mucous membranes are moist. No oropharyngeal blood. No dental injury. Airway patent without stridor. Normal voice. Neck: C-collar in place. No midline c-spine tenderness.  Cardiovascular: Normal rate, regular rhythm. Normal and symmetric distal pulses are present in  all extremities. Pulmonary/Chest: Chest wall is stable and nontender to palpation/compression. Normal respiratory effort. Breath sounds are normal. No crepitus.  Abdominal: Soft, nontender, non distended. Musculoskeletal: RLE is Shortened and externally rotated  With ttp over the lateral proximal femur. No deformities. No thoracic or lumbar midline spinal tenderness. Pelvis is stable. Skin: Skin is warm, dry and intact. No abrasions or contutions. Psychiatric: Speech and behavior are appropriate. Neurological: Normal speech and language. Moves all extremities to command. No gross focal neurologic deficits are appreciated.  Glascow Coma Score: 4 - Opens eyes on own 6 - Follows simple motor commands 5 - Alert and oriented GCS: 15  ____________________________________________   LABS (all labs ordered are listed, but only abnormal results are displayed)  Labs Reviewed  CBC  BASIC METABOLIC PANEL  PROTIME-INR  APTT  TYPE AND SCREEN   ____________________________________________  EKG  ED ECG REPORT I, Nita Sickle, the attending physician, personally viewed and interpreted this ECG.  Normal sinus rhythm, rate of 70, normal intervals, normal axis, no ST elevations or depressions.  ____________________________________________  RADIOLOGY  XR R hip: Displaced right femoral intertrochanteric fracture  CXR: No active disease. ____________________________________________   PROCEDURES  Procedure(s) performed: None Procedures Critical Care performed:  None ____________________________________________   INITIAL IMPRESSION / ASSESSMENT AND PLAN / ED COURSE   61 y.o. female with a history of rheumatoid arthritisand R total knee replacement on 07/2016 by Dr. Gavin Potters who presents for evaluation of right hip pain s/p mechanical fall. Patient found to have a right hip fracture. No other injuries based on history and physical exam. Patient discussed with Dr. Kirstie Mirza  orthopedics. Will be made NPO at midnight for surgery in the am. Surgical labs ordered. Foley placed.      Pertinent labs & imaging results that were available during my care of the patient were reviewed by me and considered in my medical decision making (see chart for details).    ____________________________________________   FINAL CLINICAL IMPRESSION(S) / ED DIAGNOSES  Final diagnoses:  Fall, initial encounter  Closed fracture of right hip, initial encounter (HCC)      NEW MEDICATIONS STARTED DURING THIS VISIT:  New Prescriptions   No medications on file     Note:  This document was prepared using Dragon voice recognition software and may include unintentional dictation errors.    Don Perking, [place], MD 10/16/16 [phone]

## 2016-10-16 NOTE — ED Notes (Signed)
Report to RN for room 153

## 2016-10-17 ENCOUNTER — Inpatient Hospital Stay: Payer: BLUE CROSS/BLUE SHIELD | Admitting: Anesthesiology

## 2016-10-17 ENCOUNTER — Inpatient Hospital Stay: Payer: BLUE CROSS/BLUE SHIELD

## 2016-10-17 ENCOUNTER — Encounter
Admission: EM | Disposition: A | Discharge status: Skilled Nursing Facility | Payer: Self-pay | Source: Home / Self Care | Attending: Internal Medicine

## 2016-10-17 ENCOUNTER — Encounter: Payer: Self-pay | Admitting: Anesthesiology

## 2016-10-17 HISTORY — PX: INTRAMEDULLARY (IM) NAIL INTERTROCHANTERIC: SHX5875

## 2016-10-17 LAB — BASIC METABOLIC PANEL
Anion gap: 6 (ref 5–15)
BUN: 12 mg/dL (ref 6–20)
CO2: 30 mmol/L (ref 22–32)
Calcium: 8.7 mg/dL — ABNORMAL LOW (ref 8.9–10.3)
Chloride: 103 mmol/L (ref 101–111)
Creatinine, Ser: 0.58 mg/dL (ref 0.44–1.00)
GFR calc Af Amer: >60 mL/min (ref >60)
GFR calc non Af Amer: >60 mL/min (ref >60)
Glucose, Bld: 116 mg/dL — ABNORMAL HIGH (ref 65–99)
Potassium: 4.2 mmol/L (ref 3.5–5.1)
Sodium: 139 mmol/L (ref 135–145)

## 2016-10-17 LAB — URINALYSIS, COMPLETE (UACMP) WITH MICROSCOPIC
Bacteria, UA: NONE SEEN
Bilirubin Urine: NEGATIVE
Glucose, UA: NEGATIVE mg/dL
Hgb urine dipstick: NEGATIVE
Ketones, ur: NEGATIVE mg/dL
Leukocytes, UA: NEGATIVE
Nitrite: NEGATIVE
Protein, ur: NEGATIVE mg/dL
Specific Gravity, Urine: 1.013 (ref 1.005–1.030)
Squamous Epithelial / LPF: NONE SEEN
pH: 7 (ref 5.0–8.0)

## 2016-10-17 LAB — CBC
HCT: 35.4 % (ref 35.0–47.0)
Hemoglobin: 12.2 g/dL (ref 12.0–16.0)
MCH: 31.4 pg (ref 26.0–34.0)
MCHC: 34.5 g/dL (ref 32.0–36.0)
MCV: 91 fL (ref 80.0–100.0)
Platelets: 243 10*3/uL (ref 150–440)
RBC: 3.89 MIL/uL (ref 3.80–5.20)
RDW: 13.6 % (ref 11.5–14.5)
WBC: 8.8 10*3/uL (ref 3.6–11.0)

## 2016-10-17 SURGERY — FIXATION, FRACTURE, INTERTROCHANTERIC, WITH INTRAMEDULLARY ROD
Anesthesia: Spinal | Laterality: Right

## 2016-10-17 MED ORDER — EPHEDRINE SULFATE 50 MG/ML IJ SOLN
INTRAMUSCULAR | Status: AC
  Filled 2016-10-17: qty 1

## 2016-10-17 MED ORDER — SODIUM CHLORIDE 0.9 % IV SOLN
INTRAVENOUS | Status: DC | PRN
  Administered 2016-10-17: 30 ug/min via INTRAVENOUS

## 2016-10-17 MED ORDER — MIDAZOLAM HCL 2 MG/2ML IJ SOLN
INTRAMUSCULAR | Status: AC
  Filled 2016-10-17: qty 2

## 2016-10-17 MED ORDER — CLINDAMYCIN PHOSPHATE 900 MG/50ML IV SOLN
INTRAVENOUS | Status: DC | PRN
  Administered 2016-10-17: 900 mg via INTRAVENOUS

## 2016-10-17 MED ORDER — ONDANSETRON HCL 4 MG PO TABS: 4.0000 mg | ORAL_TABLET | Freq: Four times a day (QID) | ORAL | Status: DC | PRN

## 2016-10-17 MED ORDER — METOCLOPRAMIDE HCL 5 MG/ML IJ SOLN: 5.0000 mg | Freq: Three times a day (TID) | INTRAMUSCULAR | Status: DC | PRN

## 2016-10-17 MED ORDER — OXYCODONE HCL 5 MG PO TABS
5.0000 mg | ORAL_TABLET | ORAL | Status: DC | PRN
  Administered 2016-10-17: 5 mg via ORAL
  Administered 2016-10-17 (×2): 10 mg via ORAL
  Administered 2016-10-17: 5 mg via ORAL
  Administered 2016-10-18 – 2016-10-19 (×6): 10 mg via ORAL
  Filled 2016-10-17 (×6): qty 2
  Filled 2016-10-17: qty 1
  Filled 2016-10-17 (×2): qty 2
  Filled 2016-10-17: qty 1

## 2016-10-17 MED ORDER — MIDAZOLAM HCL 5 MG/5ML IJ SOLN
INTRAMUSCULAR | Status: DC | PRN
  Administered 2016-10-17: 2 mg via INTRAVENOUS

## 2016-10-17 MED ORDER — ACETAMINOPHEN 325 MG PO TABS
650.0000 mg | ORAL_TABLET | Freq: Four times a day (QID) | ORAL | Status: DC | PRN
  Administered 2016-10-18 (×2): 650 mg via ORAL
  Filled 2016-10-17 (×2): qty 2

## 2016-10-17 MED ORDER — KETOROLAC TROMETHAMINE 15 MG/ML IJ SOLN
15.0000 mg | Freq: Four times a day (QID) | INTRAMUSCULAR | Status: AC
  Administered 2016-10-17 – 2016-10-18 (×3): 15 mg via INTRAVENOUS
  Filled 2016-10-17 (×3): qty 1

## 2016-10-17 MED ORDER — PHENYLEPHRINE HCL 10 MG/ML IJ SOLN
INTRAMUSCULAR | Status: AC
  Filled 2016-10-17: qty 1

## 2016-10-17 MED ORDER — METOCLOPRAMIDE HCL 10 MG PO TABS
5.0000 mg | ORAL_TABLET | Freq: Three times a day (TID) | ORAL | Status: DC | PRN
  Administered 2016-10-17: 10 mg via ORAL
  Filled 2016-10-17: qty 1

## 2016-10-17 MED ORDER — PROPOFOL 500 MG/50ML IV EMUL
INTRAVENOUS | Status: AC
  Filled 2016-10-17: qty 50

## 2016-10-17 MED ORDER — MAGNESIUM HYDROXIDE 400 MG/5ML PO SUSP
30.0000 mL | Freq: Every day | ORAL | Status: DC | PRN
  Administered 2016-10-17 – 2016-10-18 (×2): 30 mL via ORAL
  Filled 2016-10-17 (×2): qty 30

## 2016-10-17 MED ORDER — KETOROLAC TROMETHAMINE 30 MG/ML IJ SOLN: 30.0000 mg | Freq: Once | INTRAMUSCULAR | Status: DC

## 2016-10-17 MED ORDER — GLYCOPYRROLATE 0.2 MG/ML IJ SOLN
INTRAMUSCULAR | Status: AC
  Filled 2016-10-17: qty 1

## 2016-10-17 MED ORDER — LACTATED RINGERS IV SOLN
INTRAVENOUS | Status: DC | PRN
  Administered 2016-10-17 (×2): via INTRAVENOUS

## 2016-10-17 MED ORDER — FLEET ENEMA 7-19 GM/118ML RE ENEM
1.0000 | ENEMA | Freq: Once | RECTAL | Status: DC | PRN
Start: 2016-10-17 — End: 2016-10-19

## 2016-10-17 MED ORDER — PROMETHAZINE HCL 25 MG/ML IJ SOLN: 6.2500 mg | INTRAMUSCULAR | Status: DC | PRN

## 2016-10-17 MED ORDER — KCL IN DEXTROSE-NACL 20-5-0.9 MEQ/L-%-% IV SOLN
INTRAVENOUS | Status: DC
  Administered 2016-10-17 – 2016-10-18 (×2): via INTRAVENOUS
  Filled 2016-10-17 (×3): qty 1000

## 2016-10-17 MED ORDER — HYDROCODONE-ACETAMINOPHEN 10-325 MG PO TABS
1.0000 | ORAL_TABLET | ORAL | Status: DC | PRN
  Administered 2016-10-17: 1 via ORAL
  Filled 2016-10-17: qty 1

## 2016-10-17 MED ORDER — KETOROLAC TROMETHAMINE 30 MG/ML IJ SOLN
INTRAMUSCULAR | Status: AC
  Administered 2016-10-17: 30 mg
  Filled 2016-10-17: qty 1

## 2016-10-17 MED ORDER — SODIUM CHLORIDE 0.9 % IV SOLN
INTRAVENOUS | Status: DC
  Administered 2016-10-17: 10:00:00 via INTRAVENOUS

## 2016-10-17 MED ORDER — VASOPRESSIN 20 UNIT/ML IV SOLN
INTRAVENOUS | Status: DC | PRN
  Administered 2016-10-17: 2 [IU] via INTRAVENOUS

## 2016-10-17 MED ORDER — NEOMYCIN-POLYMYXIN B GU 40-200000 IR SOLN
Status: DC | PRN
  Administered 2016-10-17: 2 mL

## 2016-10-17 MED ORDER — BUPIVACAINE HCL (PF) 0.5 % IJ SOLN
INTRAMUSCULAR | Status: DC | PRN
  Administered 2016-10-17: 3 mL

## 2016-10-17 MED ORDER — GLYCOPYRROLATE 0.2 MG/ML IJ SOLN
INTRAMUSCULAR | Status: DC | PRN
  Administered 2016-10-17: 0.2 mg via INTRAVENOUS

## 2016-10-17 MED ORDER — ACETAMINOPHEN 500 MG PO TABS
1000.0000 mg | ORAL_TABLET | Freq: Four times a day (QID) | ORAL | Status: AC
  Administered 2016-10-17 (×3): 1000 mg via ORAL
  Administered 2016-10-18: 500 mg via ORAL
  Filled 2016-10-17 (×3): qty 2

## 2016-10-17 MED ORDER — ENOXAPARIN SODIUM 40 MG/0.4ML ~~LOC~~ SOLN
40.0000 mg | SUBCUTANEOUS | Status: DC
  Administered 2016-10-18 – 2016-10-19 (×2): 40 mg via SUBCUTANEOUS
  Filled 2016-10-17 (×2): qty 0.4

## 2016-10-17 MED ORDER — DIPHENHYDRAMINE HCL 12.5 MG/5ML PO ELIX
12.5000 mg | ORAL_SOLUTION | ORAL | Status: DC | PRN
  Administered 2016-10-17: 25 mg via ORAL
  Administered 2016-10-18: 12.5 mg via ORAL
  Filled 2016-10-17 (×2): qty 10

## 2016-10-17 MED ORDER — BISACODYL 10 MG RE SUPP
10.0000 mg | Freq: Every day | RECTAL | Status: DC | PRN
  Administered 2016-10-19: 10 mg via RECTAL
  Filled 2016-10-17: qty 1

## 2016-10-17 MED ORDER — ACETAMINOPHEN 650 MG RE SUPP: 650.0000 mg | Freq: Four times a day (QID) | RECTAL | Status: DC | PRN

## 2016-10-17 MED ORDER — CLINDAMYCIN PHOSPHATE 900 MG/50ML IV SOLN
900.0000 mg | Freq: Four times a day (QID) | INTRAVENOUS | Status: AC
  Administered 2016-10-17 – 2016-10-18 (×3): 900 mg via INTRAVENOUS
  Filled 2016-10-17 (×3): qty 50

## 2016-10-17 MED ORDER — MORPHINE SULFATE (PF) 2 MG/ML IV SOLN
2.0000 mg | INTRAVENOUS | Status: DC | PRN
  Administered 2016-10-17: 2 mg via INTRAVENOUS
  Filled 2016-10-17: qty 1

## 2016-10-17 MED ORDER — ONDANSETRON HCL 4 MG/2ML IJ SOLN: 4.0000 mg | Freq: Four times a day (QID) | INTRAMUSCULAR | Status: DC | PRN

## 2016-10-17 MED ORDER — PROPOFOL 10 MG/ML IV BOLUS
INTRAVENOUS | Status: DC | PRN
  Administered 2016-10-17: 20 mg via INTRAVENOUS

## 2016-10-17 MED ORDER — BUPIVACAINE-EPINEPHRINE (PF) 0.5% -1:200000 IJ SOLN
INTRAMUSCULAR | Status: DC | PRN
  Administered 2016-10-17: 30 mL via PERINEURAL

## 2016-10-17 MED ORDER — PROPOFOL 500 MG/50ML IV EMUL
INTRAVENOUS | Status: DC | PRN
  Administered 2016-10-17: 70 ug/kg/min via INTRAVENOUS

## 2016-10-17 MED ORDER — FENTANYL CITRATE (PF) 100 MCG/2ML IJ SOLN: 25.0000 ug | INTRAMUSCULAR | Status: DC | PRN

## 2016-10-17 MED ORDER — HYDROMORPHONE HCL 1 MG/ML IJ SOLN: 0.5000 mg | INTRAMUSCULAR | Status: DC | PRN

## 2016-10-17 MED ORDER — KETAMINE HCL 50 MG/ML IJ SOLN
INTRAMUSCULAR | Status: DC | PRN
  Administered 2016-10-17: 35 mg via INTRAMUSCULAR

## 2016-10-17 SURGICAL SUPPLY — 40 items
BIT DRILL 4.3MMS DISTAL GRDTED (BIT) ×1 IMPLANT
BNDG COHESIVE 4X5 TAN STRL (GAUZE/BANDAGES/DRESSINGS) ×2 IMPLANT
BNDG COHESIVE 6X5 TAN STRL LF (GAUZE/BANDAGES/DRESSINGS) ×2 IMPLANT
CANISTER SUCT 1200ML W/VALVE (MISCELLANEOUS) ×2 IMPLANT
CHLORAPREP W/TINT 26ML (MISCELLANEOUS) ×2 IMPLANT
DRAPE C-ARMOR (DRAPES) ×2 IMPLANT
DRAPE SHEET LG 3/4 BI-LAMINATE (DRAPES) ×2 IMPLANT
DRILL 4.3MMS DISTAL GRADUATED (BIT) ×2
DRSG OPSITE POSTOP 3X4 (GAUZE/BANDAGES/DRESSINGS) ×4 IMPLANT
DRSG OPSITE POSTOP 4X6 (GAUZE/BANDAGES/DRESSINGS) ×2 IMPLANT
ELECT CAUTERY BLADE 6.4 (BLADE) ×2 IMPLANT
ELECT REM PT RETURN 9FT ADLT (ELECTROSURGICAL) ×2
ELECTRODE REM PT RTRN 9FT ADLT (ELECTROSURGICAL) ×1 IMPLANT
GAUZE SPONGE 4X4 12PLY STRL (GAUZE/BANDAGES/DRESSINGS) IMPLANT
GLOVE BIO SURGEON STRL SZ8 (GLOVE) ×4 IMPLANT
GLOVE INDICATOR 8.0 STRL GRN (GLOVE) ×2 IMPLANT
GOWN STRL REUS W/ TWL LRG LVL3 (GOWN DISPOSABLE) ×1 IMPLANT
GOWN STRL REUS W/ TWL XL LVL3 (GOWN DISPOSABLE) ×1 IMPLANT
GOWN STRL REUS W/TWL LRG LVL3 (GOWN DISPOSABLE) ×1
GOWN STRL REUS W/TWL XL LVL3 (GOWN DISPOSABLE) ×1
GUIDEPIN VERSANAIL DSP 3.2X444 (ORTHOPEDIC DISPOSABLE SUPPLIES) ×2 IMPLANT
GUIDEWIRE BALL NOSE 80CM (WIRE) ×2 IMPLANT
HFN RH 130 DEG 9MM X 360MM (Nail) ×2 IMPLANT
HIP FRAC NAIL LAG SCR 10.5X100 (Orthopedic Implant) ×1 IMPLANT
MAT BLUE FLOOR 46X72 FLO (MISCELLANEOUS) ×2 IMPLANT
NEEDLE FILTER BLUNT 18X 1/2SAF (NEEDLE) ×1
NEEDLE FILTER BLUNT 18X1 1/2 (NEEDLE) ×1 IMPLANT
NEEDLE HYPO 22GX1.5 SAFETY (NEEDLE) ×2 IMPLANT
NS IRRIG 500ML POUR BTL (IV SOLUTION) ×2 IMPLANT
PACK HIP COMPR (MISCELLANEOUS) ×2 IMPLANT
SCREW BONE CORTICAL 5.0X42 (Screw) ×2 IMPLANT
SCREW CANN THRD AFF 10.5X100 (Orthopedic Implant) ×1 IMPLANT
SCREWDRIVER HEX TIP 3.5MM (MISCELLANEOUS) ×2 IMPLANT
STAPLER SKIN PROX 35W (STAPLE) ×2 IMPLANT
STRAP SAFETY BODY (MISCELLANEOUS) ×2 IMPLANT
SUT VIC AB 1 CT1 36 (SUTURE) ×2 IMPLANT
SUT VIC AB 2-0 CT1 (SUTURE) ×4 IMPLANT
SYR 30ML LL (SYRINGE) ×2 IMPLANT
SYRINGE 10CC LL (SYRINGE) ×2 IMPLANT
TAPE MICROFOAM 4IN (TAPE) IMPLANT

## 2016-10-17 NOTE — Op Note (Signed)
10/16/2016 - 10/17/2016  12:57 PM  Patient:   Amy Gregory  Pre-Op Diagnosis:   Closed displaced 2-part intertrochanteric fracture, right hip.  Post-Op Diagnosis:   Same  Procedure:   Reduction and internal fixation of right hip fracture with Biomet Affixis TFN nail.  Surgeon:   Maryagnes Amos, MD  Assistant:   None  Anesthesia:   Spinal  Findings:   As above  Complications:   None  EBL:   150 cc  Fluids:   1400 cc crystalloid  UOP:   450 cc  TT:   None  Drains:   None  Closure:   Staples  Implants:   Biomet Affixis 9 x 360 mm TFN with a 100 mm lag screw and a 42 mm distal interlocking screw  Brief Clinical Note:   The patient is a 61 year old female who sustained the above-noted injury yesterday when she tripped and fell in her kitchen. She was brought to the emergency room where x-rays demonstrated the above-noted fracture.. The patient was admitted in preparation for her surgery. She has been cleared medically and presents at this time for reduction and internal fixation of the displaced intertrochanteric right hip fracture.  Procedure:   The patient was brought into the operating room. After adequate spinal anesthesia was obtained, the patient was lain in the supine position on the fracture table. The uninjured leg was placed in a flexed and abducted position while the injured lower extremity was placed in longitudinal traction. The fracture was reduced using longitudinal traction and internal rotation. The adequacy of reduction was verified fluoroscopically in AP and lateral projections and found to be near anatomic. The lateral aspects of the right hip and thigh were prepped with ChloraPrep solution before being draped sterilely. Preoperative antibiotics were administered. A timeout was performed to verify the appropriate surgical site. The greater trochanter was identified fluoroscopically and an approximately 3 cm incision made about 2-3 fingerbreadths above the tip  of the greater trochanter. The incision was carried down through the subcutaneous tissues to expose the gluteal fascia. This was split the length of the incision, providing access to the tip of the trochanter. Under fluoroscopic guidance, a guidewire was drilled through the tip of the trochanter into the proximal metaphysis to the level of the lesser trochanter. After verifying its position fluoroscopically in AP and lateral projections, it was overreamed with the initial reamer to the depth of the lesser trochanter. A guidewire was passed down through the femoral canal to the supracondylar region. The adequacy of guidewire position was verified fluoroscopically in AP and lateral projections before the length of the guidewire within the canal was measured and found to be 390 mm. Therefore, a 360 mm length nail was selected. The guidewire was overreamed sequentially using the flexible reamers, beginning with an 8 mm reamer and progressing to an 11 mm reamer. This provided good cortical chatter. The 9 x 360 mm Biomet Affixis TFN rod was selected and advanced to the appropriate depth, as verified fluoroscopically.   The guide system for the lag screw was positioned and advanced through an approximately 2 cm stab incision over the lateral aspect of the proximal femur. The guidewire was drilled up through the trochanteric femoral nail and into the femoral neck to rest within 5 mm of subchondral bone. After verifying its position in the femoral neck and head in both AP and lateral projections, the guidewire was measured and found to be optimally replicated by a 100 mm lag screw. The  guidewire was overreamed to the appropriate depth before the lag screw was inserted and advanced to the appropriate depth as verified fluoroscopically in AP and lateral projections. The locking screw was advanced, then backed off a quarter turn to set the lag screw. Again the adequacy of hardware position and fracture reduction was verified  fluoroscopically in AP and lateral projections and found to be excellent.  Attention was directed distally. Using the "perfect circle" technique, the leg and fluoroscopy machine were positioned appropriately. An approximate 1.5 cm stab incision was made over the skin at the appropriate point before the drill bit was advanced through the cortex and across the static hole of the nail. The appropriate length of the screw was determined before the 42 mm distal interlocking screw was positioned, then advanced and tightened securely. Again the adequacy of screw position was verified fluoroscopically in AP and lateral projections and found to be excellent.  The wounds were irrigated thoroughly with sterile saline solution before the abductor fascia was reapproximated using #1 Vicryl interrupted sutures. The subcutaneous tissues were closed using 2-0 Vicryl interrupted sutures. The skin was closed using staples. A total of 30 cc of 0.5% Sensorcaine with epinephrine was injected in and around all incisions. Sterile occlusive dressings were applied to all wounds before the patient was transferred back to his/her hospital bed. The patient was then transferred to the recovery room in satisfactory condition after tolerating the procedure well.

## 2016-10-17 NOTE — Progress Notes (Signed)
Pt states pain medications ordered for patient aren't 'controlling her pain." Rept to Dr. Renae Gloss. MD to make rounds and will write new orders. Will continue to monitor.

## 2016-10-17 NOTE — Anesthesia Post-op Follow-up Note (Cosign Needed)
Anesthesia QCDR form completed.        

## 2016-10-17 NOTE — Anesthesia Preprocedure Evaluation (Addendum)
Anesthesia Evaluation  Patient identified by MRN, date of birth, ID band Patient awake    Reviewed: Allergy & Precautions, H&P , NPO status , Patient's Chart, lab work & pertinent test results, reviewed documented beta blocker date and time   History of Anesthesia Complications Negative for: history of anesthetic complications  Airway Mallampati: III  TM Distance: >3 FB Neck ROM: full    Dental  (+) Caps, Missing, Teeth Intact   Pulmonary neg pulmonary ROS, former smoker,           Cardiovascular Exercise Tolerance: Good negative cardio ROS       Neuro/Psych negative neurological ROS  negative psych ROS   GI/Hepatic Neg liver ROS, hiatal hernia, GERD  ,  Endo/Other  negative endocrine ROS  Renal/GU Renal disease (kidney stones)  negative genitourinary   Musculoskeletal   Abdominal   Peds  Hematology negative hematology ROS (+)   Anesthesia Other Findings Past Medical History: No date: Bilateral hearing loss No date: Dysphagia No date: GERD (gastroesophageal reflux disease) No date: Hiatal hernia     Comment: small No date: History of diverticulitis No date: Impaired memory No date: Nephrolithiasis No date: OA (osteoarthritis) No date: Rheumatoid arthritis (HCC)     Comment: managed by Dr. Gavin Potters   Reproductive/Obstetrics negative OB ROS                             Anesthesia Physical Anesthesia Plan  ASA: II  Anesthesia Plan: Spinal   Post-op Pain Management:    Induction:   PONV Risk Score and Plan: 2 and Ondansetron and Propofol  Airway Management Planned:   Additional Equipment:   Intra-op Plan:   Post-operative Plan:   Informed Consent: I have reviewed the patients History and Physical, chart, labs and discussed the procedure including the risks, benefits and alternatives for the proposed anesthesia with the patient or authorized representative who has  indicated his/her understanding and acceptance.   Dental Advisory Given  Plan Discussed with: Anesthesiologist, CRNA and Surgeon  Anesthesia Plan Comments:        Anesthesia Quick Evaluation

## 2016-10-17 NOTE — Anesthesia Procedure Notes (Signed)
Spinal  Patient location during procedure: OR Start time: 10/17/2016 11:07 AM End time: 10/17/2016 11:10 AM Staffing Anesthesiologist: Martha Clan Resident/CRNA: Rosaria Ferries, Kennet Mccort Performed: resident/CRNA  Preanesthetic Checklist Completed: patient identified, site marked, surgical consent, pre-op evaluation, timeout performed, IV checked, risks and benefits discussed and monitors and equipment checked Spinal Block Patient position: left lateral decubitus Prep: ChloraPrep Patient monitoring: heart rate, continuous pulse ox, blood pressure and cardiac monitor Approach: midline Location: L3-4 Injection technique: single-shot Needle Needle type: Whitacre and Introducer  Needle gauge: 24 G Needle length: 9 cm Assessment Sensory level: T10 Additional Notes Negative paresthesia. Negative blood return. Positive free-flowing CSF. Expiration date of kit checked and confirmed. Patient tolerated procedure well, without complications.

## 2016-10-17 NOTE — Consult Note (Signed)
ORTHOPAEDIC CONSULTATION  REQUESTING PHYSICIAN: Alford Highland, MD  Chief Complaint:   Right hip pain.  History of Present Illness: Amy Gregory is a 61 y.o. female with a history of rheumatoid arthritis, osteoarthritis, GERD, and diverticulitis who is now only 3 months status post a right total knee arthroplasty. The patient just completed her therapy for the knee. Apparently, yesterday the patient was walking in her kitchen when she apparently lost her balance and fell onto her right hip. She was brought to the emergency room where x-rays to him since the above-noted injury. The patient's sensory was admitted for medical stabilization prior to definitive management of her injury. The patient denies any associated injuries. She did not strike her head or lose consciousness. The patient also denies any lightheadedness, dizziness, chest pain, shortness breath, or other symptoms may have precipitated her fall. The patient lives independently with her husband.  Past Medical History:  Diagnosis Date  . Bilateral hearing loss   . Dysphagia   . GERD (gastroesophageal reflux disease)   . Hiatal hernia    small  . History of diverticulitis   . Impaired memory   . Nephrolithiasis   . OA (osteoarthritis)   . Rheumatoid arthritis (HCC)    managed by Dr. Gavin Potters   Past Surgical History:  Procedure Laterality Date  . ABDOMINAL HYSTERECTOMY  2002   for fibroids, non cancerous reasons, ovaries remain  . BREAST BIOPSY Left 2005-ish   benign  . CATARACT EXTRACTION  2014   OU  . COLONOSCOPY  04/26/04  . ESOPHAGEAL DILATION    . ESOPHAGOGASTRODUODENOSCOPY  02/01/05 and 07/13/14  . KNEE SURGERY Right 03/2014   partial medial and lateral meniscectomy and medial condryle debridement  . LEG SURGERY  2013   fractured left leg, toes, hospitalized for 2 weeks  . ORIF TIBIA FRACTURE     remote ORIF proximal left tiba fracture   Social  History   Social History  . Marital status: Married    Spouse name: N/A  . Number of children: N/A  . Years of education: N/A   Social History Main Topics  . Smoking status: Former Smoker    Quit date: 05/08/1969  . Smokeless tobacco: Never Used  . Alcohol use No  . Drug use: No  . Sexual activity: Not Currently   Other Topics Concern  . None   Social History Narrative  . None   Family History  Problem Relation Age of Onset  . Heart disease Mother   . Heart attack Mother   . Rheum arthritis Mother   . Stroke Mother   . Arthritis Mother        RA  . Hypertension Mother   . Stroke Father   . Diabetes Father   . Cancer Sister        lymphoma  . Heart attack Maternal Uncle   . Cancer Maternal Grandmother        leukemia  . Arthritis Maternal Grandmother   . Depression Maternal Grandfather   . Stroke Paternal Grandmother   . Cancer Paternal Grandfather   . Arthritis Sister        rheumatitis  . Thyroid disease Sister   . Hypertension Sister   . Cancer Sister        lung  . Depression Sister    Allergies  Allergen Reactions  . Sulfa Antibiotics   . Cefdinir Rash   Prior to Admission medications   Medication Sig Start Date End Date Taking? Authorizing  Provider  Ascorbic Acid (VITAMIN C) 100 MG tablet Take 100 mg by mouth daily.   Yes [provider]  diphenhydramine-acetaminophen (TYLENOL PM) 25-500 MG TABS tablet Take by mouth.   Yes [provider]  folic acid (FOLVITE) 1 MG tablet TAKE 1 TABLET BY MOUTH ONCE DAILY 07/03/16  Yes [provider]  methotrexate (RHEUMATREX) 2.5 MG tablet Take 15 mg by mouth once a week. Take 6  2.5mg  pills once a week. 06/25/15  Yes [provider]  Multiple Vitamin (MULTIVITAMIN) capsule Take by mouth.   Yes [provider]  pantoprazole (PROTONIX) 40 MG tablet TAKE 1 TABLET BY MOUTH ONCE DAILY. 05/12/15  Yes [provider]  sulfamethoxazole-trimethoprim (BACTRIM) 400-80 MG  tablet Take 1 tablet by mouth 2 (two) times daily. Patient not taking: Reported on 10/16/2016 09/15/16   Felecia Shelling, DPM   Dg Chest 1 View  Result Date: 10/16/2016 CLINICAL DATA:  Right hip pain after fall today. EXAM: CHEST 1 VIEW COMPARISON:  12/20/2011 FINDINGS: Lungs are adequately inflated and otherwise clear. Cardiomediastinal silhouette is within normal. Remaining bones and soft tissues are normal. IMPRESSION: No active disease. Electronically Signed   By: Elberta Fortis M.D.   On: 10/16/2016 17:29   Dg Hip Unilat  With Pelvis 2-3 Views Right  Result Date: 10/16/2016 CLINICAL DATA:  Right hip pain after fall today. EXAM: DG HIP (WITH OR WITHOUT PELVIS) 2-3V RIGHT COMPARISON:  CT 08/31/2016 FINDINGS: There is mild diffuse osteopenia. Mild symmetric degenerative change of the hips. There is a displaced intertrochanteric fracture of the right femoral neck. Mild degenerative change of the spine. IMPRESSION: Displaced right femoral intertrochanteric fracture. Electronically Signed   By: Elberta Fortis M.D.   On: 10/16/2016 17:28    Positive ROS: All other systems have been reviewed and were otherwise negative with the exception of those mentioned in the HPI and as above.  Physical Exam: General:  Alert, no acute distress Psychiatric:  Patient is competent for consent with normal mood and affect   Cardiovascular:  No pedal edema Respiratory:  No wheezing, non-labored breathing GI:  Abdomen is soft and non-tender Skin:  No lesions in the area of chief complaint Neurologic:  Sensation intact distally Lymphatic:  No axillary or cervical lymphadenopathy  Orthopedic Exam:  Orthopedic examination is limited to the right lower extremity and foot. The right lower extremities somewhat shortened and externally rotated as compared to the left. Skin inspection around the right hip is unremarkable. There is no erythema, ecchymosis, abrasions, or other abnormalities. She has mild-moderate tenderness to  palpation over the lateral aspect of the hip. She has more severe pain with any attempted active or passive motion of the hip. She is able to actively dorsiflex and plantarflex her toes. Sensation is intact to light touch to all distributions of the right dorsal midfoot. She has good capillary refill to her foot.  X-rays:  X-rays of the right hip are available for review. These films demonstrate a displaced intertrochanteric fracture of the right hip. No significant degenerative changes of the hip joint are noted. No lytic lesions or other bony abnormalities are identified.  Assessment: Displaced two-part intertrochanteric fracture right hip.  Plan: The treatment options are discussed with the patient and her family, including both surgical and nonsurgical choices. The patient would like to proceed with surgical intervention to include intramedullary nailing of the intertrochanteric hip fracture. This procedure has been discussed in detail, as have the potential risks (including bleeding, infection, nerve and/or  blood vessel injury, persistent or recurrent pain, stiffness, malunion or nonunion, need for further surgery, blood clots, strokes, heart attacks and/or arrhythmias, etc.) and benefits. The patient states her understanding and wishes to proceed. A surgical consent has been obtained. We will proceed with surgery today as she has been cleared medically.  Thank you for ask me to produce pain in the care of this most pleasant unfortunate woman. I will be happy to follow her with you.   Maryagnes Amos, MD  Beeper #:  [phone]  10/17/2016 10:11 AM

## 2016-10-17 NOTE — Transfer of Care (Signed)
Immediate Anesthesia Transfer of Care Note  Patient: Amy Gregory  Procedure(s) Performed: Procedure(s): INTRAMEDULLARY (IM) NAIL INTERTROCHANTRIC (Right)  Patient Location: PACU  Anesthesia Type:Spinal  Level of Consciousness: sedated  Airway & Oxygen Therapy: Patient Spontanous Breathing and Patient connected to nasal cannula oxygen  Post-op Assessment: Report given to RN and Post -op Vital signs reviewed and stable  Post vital signs: Reviewed and stable  Last Vitals:  Vitals:   10/17/16 1011 10/17/16 1248  BP: (!) 150/69 (!) (P) 94/56  Pulse: 72 (P) 70  Resp: 20 (!) (P) 6  Temp: 36.9 C (P) 36.3 C    Last Pain:  Vitals:   10/17/16 1011  TempSrc: Oral  PainSc:          Complications: No apparent anesthesia complications

## 2016-10-17 NOTE — NC FL2 (Signed)
[place] MEDICAID FL2 LEVEL OF CARE SCREENING TOOL     IDENTIFICATION  Patient Name: Amy Gregory Birthdate: 07/04/1955 Sex: female Admission Date (Current Location): 10/16/2016  [place] Number:  Chiropodist and Address:  [facility], [address] [place], [place] [address]      Provider Number: [id number]  Attending Physician Name and Address:  Alford Highland, MD  Relative Name and Phone Number:       Current Level of Care: Hospital Recommended Level of Care: Skilled Nursing Facility Prior Approval Number:    Date Approved/Denied:   PASRR Number:  [id number])  Discharge Plan: SNF    Current Diagnoses: Patient Active Problem List   Diagnosis Date Noted  . Closed displaced spiral fracture of shaft of right femur (HCC) 10/16/2016  . Rheumatoid arthritis (HCC) 01/20/2016  . Increased urinary frequency 07/13/2015  . Vitamin D deficiency 07/13/2015  . Preventative health care 07/13/2015  . Osteopenia 07/13/2015  . Right upper quadrant abdominal pain 07/13/2015  . Bilateral hearing loss   . Dysphagia   . Impaired memory   . History of diverticulitis   . Arthritis of knee, degenerative 03/04/2015  . Gonalgia 02/18/2015  . Can't get food down 07/02/2014  . History of arthroscopy of knee 05/07/2014  . Calculus of kidney 10/02/2013  . Arthritis, degenerative 08/21/2013  . Elevated rheumatoid factor 08/21/2013    Orientation RESPIRATION BLADDER Height & Weight     Self, Time, Situation, Place  O2 (2 Liters Oxygen ) Continent Weight: 174 lb (78.9 kg) Height:     BEHAVIORAL SYMPTOMS/MOOD NEUROLOGICAL BOWEL NUTRITION STATUS   (none )  (none) Continent Diet (Diet: Clear Liquid to be advanced. )  AMBULATORY STATUS COMMUNICATION OF NEEDS Skin   Extensive Assist Verbally Surgical wounds (Incision: Right Hip. )                       Personal Care Assistance Level of Assistance  Bathing, Feeding, Dressing  Bathing Assistance: Limited assistance Feeding assistance: Independent Dressing Assistance: Limited assistance     Functional Limitations Info  Sight, Hearing, Speech Sight Info: Adequate Hearing Info: Adequate Speech Info: Adequate    SPECIAL CARE FACTORS FREQUENCY  PT (By licensed PT), OT (By licensed OT)     PT Frequency:  (5) OT Frequency:  (5)            Contractures      Additional Factors Info  Code Status, Allergies Code Status Info:  (Full Code. ) Allergies Info:  (Sulfa Antibiotics, Cefdinir)           Current Medications (10/17/2016):  This is the current hospital active medication list Current Facility-Administered Medications  Medication Dose Route Frequency Provider Last Rate Last Dose  . acetaminophen (TYLENOL) tablet 650 mg  650 mg Oral Q6H PRN Poggi, Excell Seltzer, MD       Or  . acetaminophen (TYLENOL) suppository 650 mg  650 mg Rectal Q6H PRN Poggi, Excell Seltzer, MD      . acetaminophen (TYLENOL) tablet 1,000 mg  1,000 mg Oral Q6H Poggi, Excell Seltzer, MD   1,000 mg at 10/17/16 1454  . albuterol (PROVENTIL) (2.5 MG/3ML) 0.083% nebulizer solution 2.5 mg  2.5 mg Nebulization Q2H PRN Sudini, Srikar, MD      . bisacodyl (DULCOLAX) suppository 10 mg  10 mg Rectal Daily PRN Poggi, Excell Seltzer, MD      . clindamycin (CLEOCIN) IVPB 900 mg  900 mg  Intravenous Q6H Poggi, Excell Seltzer, MD      . dextrose 5 % and 0.9 % NaCl with KCl 20 mEq/L infusion   Intravenous Continuous Poggi, Excell Seltzer, MD 75 mL/hr at 10/17/16 1454    . diphenhydrAMINE (BENADRYL) 12.5 MG/5ML elixir 12.5-25 mg  12.5-25 mg Oral Q4H PRN Poggi, Excell Seltzer, MD      . docusate sodium (COLACE) capsule 100 mg  100 mg Oral BID Milagros Loll, MD   100 mg at 10/16/16 2122  . [START ON 10/18/2016] enoxaparin (LOVENOX) injection 40 mg  40 mg Subcutaneous Q24H Poggi, Excell Seltzer, MD      . folic acid (FOLVITE) tablet 1 mg  1 mg Oral Daily Sudini, Srikar, MD      . HYDROmorphone (DILAUDID) injection 0.5-1 mg  0.5-1 mg Intravenous Q2H PRN Poggi,  Excell Seltzer, MD      . ketorolac (TORADOL) 15 MG/ML injection 15 mg  15 mg Intravenous Q6H Poggi, Excell Seltzer, MD      . magnesium hydroxide (MILK OF MAGNESIA) suspension 30 mL  30 mL Oral Daily PRN Poggi, Excell Seltzer, MD      . Melene Muller ON 10/21/2016] methotrexate (RHEUMATREX) tablet 15 mg  15 mg Oral Q Sat Sudini, Srikar, MD      . metoCLOPramide (REGLAN) tablet 5-10 mg  5-10 mg Oral Q8H PRN Poggi, Excell Seltzer, MD       Or  . metoCLOPramide (REGLAN) injection 5-10 mg  5-10 mg Intravenous Q8H PRN Poggi, Excell Seltzer, MD      . ondansetron (ZOFRAN) tablet 4 mg  4 mg Oral Q6H PRN Poggi, Excell Seltzer, MD       Or  . ondansetron (ZOFRAN) injection 4 mg  4 mg Intravenous Q6H PRN Poggi, Excell Seltzer, MD      . oxyCODONE (Oxy IR/ROXICODONE) immediate release tablet 5-10 mg  5-10 mg Oral Q3H PRN Poggi, Excell Seltzer, MD   5 mg at 10/17/16 1505  . pantoprazole (PROTONIX) EC tablet 40 mg  40 mg Oral Daily Milagros Loll, MD   40 mg at 10/17/16 1000  . polyethylene glycol (MIRALAX / GLYCOLAX) packet 17 g  17 g Oral Daily PRN Sudini, Srikar, MD      . sodium phosphate (FLEET) 7-19 GM/118ML enema 1 enema  1 enema Rectal Once PRN Poggi, Excell Seltzer, MD         Discharge Medications: Please see discharge summary for a list of discharge medications.  Relevant Imaging Results:  Relevant Lab Results:   Additional Information  (SSN: [id number])  Jaquawn Saffran, Darleen Crocker, LCSW

## 2016-10-17 NOTE — Clinical Social Work Note (Signed)
Clinical Social Work Assessment  Patient Details  Name: Amy Gregory MRN: [id number] Date of Birth: [birth year]  Date of referral:  10/17/16               Reason for consult:  Facility Placement                Permission sought to share information with:    Permission granted to share information::     Name::        Agency::     Relationship::     Contact Information:     Housing/Transportation Living arrangements for the past 2 months:  Single Family Home Source of Information:  Patient Patient Interpreter Needed:  None Criminal Activity/Legal Involvement Pertinent to Current Situation/Hospitalization:  No - Comment as needed Significant Relationships:  Siblings, Spouse Lives with:  Spouse Do you feel safe going back to the place where you live?  Yes Need for family participation in patient care:  Yes (Comment)  Care giving concerns:  Patient lives in [place] with her husband Amy Gregory.    Social Worker assessment / plan:  Clinical Social Worker ([place]) received SNF consult. Patient had surgery today for a femur fracture. CSW met with patient briefly after surgery today. Patient was alert and oriented X4 and was alone in the room. CSW introduced self and explained role of CSW department. Patient reported that she lives in [place] with her husband and was just at [facility] for another surgery not too long ago. Patient reported that she went home with home health after that surgery and would like to do the same this time. CSW explained that PT will evaluate patient and make a recommendation of home health or SNF. Patient reported that she prefers to go home. Patient reported that when her husband is at work her sister will be able to sit with her at home. CSW will continue to follow and assist as needed.    Employment status:  Retired Forensic scientist:  Managed Care PT Recommendations:  Not assessed at this time Information / Referral to community resources:  Other (Comment Required)  (SNF VS. Home Health )  Patient/Family's Response to care:  Patient prefers to go home with home health.   Patient/Family's Understanding of and Emotional Response to Diagnosis, Current Treatment, and Prognosis:  Patient was very pleasant and thanked CSW for visit.   Emotional Assessment Appearance:  Appears stated age Attitude/Demeanor/Rapport:    Affect (typically observed):  Accepting, Adaptable, Pleasant Orientation:  Oriented to Place, Oriented to Self, Oriented to Situation, Oriented to  Time Alcohol / Substance use:  Not Applicable Psych involvement (Current and /or in the community):  No (Comment)  Discharge Needs  Concerns to be addressed:  Discharge Planning Concerns Readmission within the last 30 days:  No Current discharge risk:  Dependent with Mobility Barriers to Discharge:  Continued Medical Work up   [employer], Amy Beets, LCSW 10/17/2016, 4:32 PM

## 2016-10-17 NOTE — Progress Notes (Signed)
Patient ID: Amy Gregory, female   DOB: 1955-06-11, 61 y.o.   MRN: [id number]  [facility] PROGRESS NOTE  SHAMYA MACFADDEN NIO:[id number] DOB: 03-02-1956 DOA: 10/16/2016 PCP: Kerman Passey, MD  HPI/Subjective: Patient stated she recently had a knee replacement. She states she needs in the replacement on the left side. She has been walking with a cane. She thinks her feet got tripped up on her and she just fell. No loss of consciousness. No weakness one side versus the other.severe pain right hip.  Objective: Vitals:   10/16/16 1956 10/17/16 0042  BP: (!) 152/62 (!) 167/83  Pulse: 69 70  Resp: 19 20  Temp: 98.4 F (36.9 C) 98.3 F (36.8 C)    Filed Weights   10/16/16 1550 10/16/16 1958 10/17/16 0535  Weight: 78.9 kg (174 lb) 79 kg (174 lb 3.2 oz) 78.9 kg (174 lb)    ROS: Review of Systems  Constitutional: Negative for chills and fever.  Eyes: Negative for blurred vision.  Respiratory: Negative for cough and shortness of breath.   Cardiovascular: Negative for chest pain.  Gastrointestinal: Negative for abdominal pain, constipation, diarrhea, nausea and vomiting.  Genitourinary: Negative for dysuria.  Musculoskeletal: Positive for joint pain.  Neurological: Negative for dizziness and headaches.   Exam: Physical Exam  Constitutional: She is oriented to person, place, and time.  HENT:  Nose: No mucosal edema.  Mouth/Throat: No oropharyngeal exudate or posterior oropharyngeal edema.  Eyes: Conjunctivae, EOM and lids are normal. Pupils are equal, round, and reactive to light.  Neck: No JVD present. Carotid bruit is not present. No edema present. No thyroid mass and no thyromegaly present.  Cardiovascular: S1 normal and S2 normal.  Exam reveals no gallop.   No murmur heard. Pulses:      Dorsalis pedis pulses are 2+ on the right side, and 2+ on the left side.  Respiratory: No respiratory distress. She has no wheezes. She has no rhonchi. She has no rales.  GI: Soft. Bowel  sounds are normal. There is no tenderness.  Musculoskeletal:       Right ankle: She exhibits no swelling.       Left ankle: She exhibits no swelling.  Left leg shortened and externally rotated  Lymphadenopathy:    She has no cervical adenopathy.  Neurological: She is alert and oriented to person, place, and time. No cranial nerve deficit.  Sensation intact bilateral legs. Able to wiggle toes bilaterally.  Skin: Skin is warm. No rash noted. Nails show no clubbing.  Psychiatric: She has a normal mood and affect.      Data Reviewed: Basic Metabolic Panel:  Recent Labs Lab 10/16/16 1719 10/17/16 0442  NA 138 139  K 3.9 4.2  CL 103 103  CO2 26 30  GLUCOSE 127* 116*  BUN 14 12  CREATININE 0.54 0.58  CALCIUM 9.0 8.7*   CBC:  Recent Labs Lab 10/16/16 1719 10/17/16 0442  WBC 12.0* 8.8  HGB 12.2 12.2  HCT 35.6 35.4  MCV 90.5 91.0  PLT 255 243     Recent Results (from the past 240 hour(s))  Surgical PCR screen     Status: None   Collection Time: 10/16/16  3:46 PM  Result Value Ref Range Status   MRSA, PCR NEGATIVE NEGATIVE Final   Staphylococcus aureus NEGATIVE NEGATIVE Final    Comment:        The Xpert SA Assay (FDA approved for NASAL specimens in patients over 27 years of age), is one  component of a comprehensive surveillance program.  Test performance has been validated by [facility] for patients greater than or equal to 40 year old. It is not intended to diagnose infection nor to guide or monitor treatment.      Studies: Dg Chest 1 View  Result Date: 10/16/2016 CLINICAL DATA:  Right hip pain after fall today. EXAM: CHEST 1 VIEW COMPARISON:  12/20/2011 FINDINGS: Lungs are adequately inflated and otherwise clear. Cardiomediastinal silhouette is within normal. Remaining bones and soft tissues are normal. IMPRESSION: No active disease. Electronically Signed   By: Elberta Fortis M.D.   On: 10/16/2016 17:29   Dg Hip Unilat  With Pelvis 2-3 Views  Right  Result Date: 10/16/2016 CLINICAL DATA:  Right hip pain after fall today. EXAM: DG HIP (WITH OR WITHOUT PELVIS) 2-3V RIGHT COMPARISON:  CT 08/31/2016 FINDINGS: There is mild diffuse osteopenia. Mild symmetric degenerative change of the hips. There is a displaced intertrochanteric fracture of the right femoral neck. Mild degenerative change of the spine. IMPRESSION: Displaced right femoral intertrochanteric fracture. Electronically Signed   By: Elberta Fortis M.D.   On: 10/16/2016 17:28    Scheduled Meds: . docusate sodium  100 mg Oral BID  . folic acid  1 mg Oral Daily  . [START ON 10/21/2016] methotrexate  15 mg Oral Q Sat  . pantoprazole  40 mg Oral Daily   Continuous Infusions: . clindamycin (CLEOCIN) IV      Assessment/Plan:  1. Preoperative consultation for displaced right femoral intertrochanteric fracture. No contraindications to surgery at this time. Severe right hip pain. Increase Norco to 02/08/2024 every 4 hours when necessary. Decrease frequency of morphine 2 mg IV every 2 hours when necessary severe pain. Patient willing to undergo the risk of surgery in order to walk again as soon as possible.  2. rheumatoid arthritis on methotrexate and folic acid 3. GERD on Protonix  Code Status:     Code Status Orders        Start     Ordered   10/16/16 1854  Full code  Continuous     10/16/16 1854    Code Status History    Date Active Date Inactive Code Status Order ID Comments User Context   This patient has a current code status but no historical code status.     Disposition Plan: To rehabilitation 2-3 days postoperatively  Consultants:  Orthopedic surgery  Time spent: 25 minutes  Alford Highland  [employer]

## 2016-10-18 ENCOUNTER — Encounter: Payer: Self-pay | Admitting: Surgery

## 2016-10-18 LAB — BASIC METABOLIC PANEL
Anion gap: 3 — ABNORMAL LOW (ref 5–15)
BUN: 13 mg/dL (ref 6–20)
CO2: 29 mmol/L (ref 22–32)
Calcium: 8 mg/dL — ABNORMAL LOW (ref 8.9–10.3)
Chloride: 108 mmol/L (ref 101–111)
Creatinine, Ser: 0.53 mg/dL (ref 0.44–1.00)
GFR calc Af Amer: >60 mL/min (ref >60)
GFR calc non Af Amer: >60 mL/min (ref >60)
Glucose, Bld: 114 mg/dL — ABNORMAL HIGH (ref 65–99)
Potassium: 4.3 mmol/L (ref 3.5–5.1)
Sodium: 140 mmol/L (ref 135–145)

## 2016-10-18 LAB — CBC WITH DIFFERENTIAL/PLATELET
Basophils Absolute: 0 10*3/uL (ref 0–0.1)
Basophils Relative: 0 %
Eosinophils Absolute: 0.1 10*3/uL (ref 0–0.7)
Eosinophils Relative: 2 %
HCT: 29.1 % — ABNORMAL LOW (ref 35.0–47.0)
Hemoglobin: 10 g/dL — ABNORMAL LOW (ref 12.0–16.0)
Lymphocytes Relative: 25 %
Lymphs Abs: 1.6 10*3/uL (ref 1.0–3.6)
MCH: 31 pg (ref 26.0–34.0)
MCHC: 34.4 g/dL (ref 32.0–36.0)
MCV: 90.2 fL (ref 80.0–100.0)
Monocytes Absolute: 0.7 10*3/uL (ref 0.2–0.9)
Monocytes Relative: 11 %
Neutro Abs: 3.8 10*3/uL (ref 1.4–6.5)
Neutrophils Relative %: 62 %
Platelets: 186 10*3/uL (ref 150–440)
RBC: 3.22 MIL/uL — ABNORMAL LOW (ref 3.80–5.20)
RDW: 13.4 % (ref 11.5–14.5)
WBC: 6.3 10*3/uL (ref 3.6–11.0)

## 2016-10-18 LAB — HIV ANTIBODY (ROUTINE TESTING W REFLEX): HIV Screen 4th Generation wRfx: NONREACTIVE

## 2016-10-18 MED ORDER — CYCLOBENZAPRINE HCL 10 MG PO TABS
5.0000 mg | ORAL_TABLET | Freq: Three times a day (TID) | ORAL | Status: DC | PRN
  Administered 2016-10-18: 5 mg via ORAL
  Filled 2016-10-18: qty 1

## 2016-10-18 MED ORDER — KETOROLAC TROMETHAMINE 15 MG/ML IJ SOLN
15.0000 mg | Freq: Four times a day (QID) | INTRAMUSCULAR | Status: DC
  Administered 2016-10-18 – 2016-10-19 (×4): 15 mg via INTRAVENOUS
  Filled 2016-10-18 (×4): qty 1

## 2016-10-18 NOTE — Plan of Care (Signed)
Problem: Pain Management: Goal: Pain level will decrease with appropriate interventions Outcome: Not Progressing Pt flinches and calls out in pain even with minimal movements of the R leg Polar care applied, Pain medications administered as needed and as scheduled.

## 2016-10-18 NOTE — Evaluation (Signed)
Physical Therapy Evaluation Patient Details Name: Amy Gregory MRN: [id number] DOB: 28-Jul-1955 Today's Date: 10/18/2016   History of Present Illness  61 y/o female admitted to ED on 10/16/2016 after a fall, which resulted in a R femur fracture. Pt states her R LE just "gave out". ORIF was performed on 10/17/2016. Pt is 3 months s/p a R TKA, and she had just begun driving and walking without a cane for short distances. PMH includes RA, GERD, R toe surgery (s/p a fall - pt reports she tripped), hearing loss, dysphagia, hiatal hernia, diverticulitis, impaired memory, OA, and nephrolithiasis.     Clinical Impression  Pt is a pleasant 60 year old admitted for a R femur fracture s/p a fall and ORIF surgery. Pt performs bed mobility with +2 max assist, and transfers/ambulates with +2 mod assist and a RW. Pt demonstrates deficits in strength, ROM, and pain. Would benefit from skilled PT to address above deficits and promote optimal return to PLOF. Pt is motivated to participate in therapy and is eager to return to ambulating (with a cane) and driving. PT recommends dc to a SNF due to these deficits and pt's status far below her PLOF.     Follow Up Recommendations SNF    Equipment Recommendations  None recommended by PT    Recommendations for Other Services       Precautions / Restrictions Precautions Precautions: Fall Precaution Comments: Pt scared of falling again. Restrictions Weight Bearing Restrictions: Yes RLE Weight Bearing: Weight bearing as tolerated      Mobility  Bed Mobility Overal bed mobility: Needs Assistance Bed Mobility: Supine to Sit     Supine to sit: Max assist;+2 for physical assistance;HOB elevated     General bed mobility comments: Pt apprehensive and in a great deal of pain moving from supine to sit EOB. 1 PT managed pt's B LE's, other managed pt's upper body. Verbal cueing for encouragement and sequencing. Mild dizziness once EOB, which passed  quickly.  Transfers Overall transfer level: Needs assistance Equipment used: Rolling walker (2 wheeled) Transfers: Sit to/from Stand Sit to Stand: Mod assist;+2 physical assistance;+2 safety/equipment         General transfer comment: Pt expressed severe pain and fear of falling during sit to stand. Verbal cueing for sequencing and encouragement. No dizziness noted once standing. Pt stated she felt better once standing for roughly .  Ambulation/Gait Ambulation/Gait assistance: Mod assist;+2 physical assistance;+2 safety/equipment Ambulation Distance (Feet): 3 Feet Assistive device: Rolling walker (2 wheeled) Gait Pattern/deviations: Step-to pattern;Trunk flexed;Wide base of support;Antalgic     General Gait Details: Pt ambulated from EOB to recliner with +2 mod assist for safety and encouragement. Verbal cueing for proper sequencing. Pt tolerated partial weight bearing through her R LE.  Stairs            Wheelchair Mobility    Modified Rankin (Stroke Patients Only)       Balance Overall balance assessment: Needs assistance;History of Falls Sitting-balance support: Bilateral upper extremity supported;Feet supported   Sitting balance - Comments: No LOB noted seated. Mild dizziness at EOB. Pt unable to remove UE's from bed due to pain and apprehension.   Standing balance support: Bilateral upper extremity supported;During functional activity   Standing balance comment: Pt used B UE support with RW to off-load her R LE. No LOB or dizziness noted standing.  Pertinent Vitals/Pain Pain Assessment: 0-10 Pain Score: 8  Pain Location: R hip Pain Descriptors / Indicators: Operative site guarding;Crying;Grimacing;Guarding Pain Intervention(s): Limited activity within patient's tolerance;Monitored during session;Premedicated before session;Repositioned;Ice applied    Home Living Family/patient expects to be discharged to:: Private  residence Living Arrangements: Spouse/significant other;Children (grown children) Available Help at Discharge: Family (sister available to be home when husband at work) Type of Home: House Home Access: Stairs to enter Entrance Stairs-Rails: Can reach both Entrance Stairs-Number of Steps: 4 Home Layout: One level Home Equipment: Environmental consultant - 2 wheels;Cane - single point;Bedside commode      Prior Function Level of Independence: Independent with assistive device(s)         Comments: Ambulated without an AD within her home and a single point cane for longer distances. Pt began driving recently. Pt planning to get her L knee replaced soon.      Hand Dominance        Extremity/Trunk Assessment   Upper Extremity Assessment Upper Extremity Assessment: Overall WFL for tasks assessed (grossly 4+/5 B for grip and elbow flex/ext)    Lower Extremity Assessment Lower Extremity Assessment: RLE deficits/detail;LLE deficits/detail RLE Deficits / Details: grossly 3/5 for DF/PF, 2/5 for knee flex/ext and hip abd (sensation normal) LLE Deficits / Details: grossly 2/5 for DF, knee flex/ext and hip abd. 3/5 for PF. (sensation normal)       Communication   Communication: No difficulties  Cognition Arousal/Alertness: Awake/alert Behavior During Therapy: WFL for tasks assessed/performed;Anxious (Pt scared to fall, very apprehensive with any LE movement B) Overall Cognitive Status: Within Functional Limits for tasks assessed                                 General Comments: Pt required encouragement to continue mobilizing from bed to chair. Pt able to follow commands well despite extreme pain and fear of falling.      General Comments      Exercises Other Exercises Other Exercises: Supine ther-ex on R LE x10 included: ankle pumps B, quad set, heel slides B, SAQ's, and hip abd B. Min-mod assist for all ther-ex except ankle pumps. Verbal cueing for sequencing and encouragement due  to pain with all R LE movements.   Assessment/Plan    PT Assessment Patient needs continued PT services  PT Problem List Decreased strength;Decreased range of motion;Decreased activity tolerance;Pain       PT Treatment Interventions Gait training;Stair training;Therapeutic activities;Therapeutic exercise;Balance training;Patient/family education    PT Goals (Current goals can be found in the Care Plan section)  Acute Rehab PT Goals Patient Stated Goal: to get stronger PT Goal Formulation: With patient Time For Goal Achievement: 11/01/16 Potential to Achieve Goals: Good    Frequency BID   Barriers to discharge        Co-evaluation               AM-PAC PT "6 Clicks" Daily Activity  Outcome Measure Difficulty turning over in bed (including adjusting bedclothes, sheets and blankets)?: Total Difficulty moving from lying on back to sitting on the side of the bed? : Total Difficulty sitting down on and standing up from a chair with arms (e.g., wheelchair, bedside commode, etc,.)?: Total Help needed moving to and from a bed to chair (including a wheelchair)?: A Lot Help needed walking in hospital room?: A Lot Help needed climbing 3-5 steps with a railing? : Total 6 Click Score: 8  End of Session Equipment Utilized During Treatment: Gait belt Activity Tolerance: Patient limited by pain Patient left: in chair;with call bell/phone within reach;with chair alarm set;with SCD's reapplied Nurse Communication: Mobility status PT Visit Diagnosis: Other abnormalities of gait and mobility (R26.89);Muscle weakness (generalized) (M62.81);Repeated falls (R29.6);Pain Pain - Right/Left: Right Pain - part of body: Hip    Time: 2694-8546 PT Time Calculation (min) (ACUTE ONLY): 24 min   Charges:         PT G Codes:        Mabeline Caras, PT, SPT  [place]ula Soda 10/18/2016, 12:50 PM

## 2016-10-18 NOTE — Progress Notes (Signed)
PT is recommending SNF. Clinical Social Worker (CSW) met with patient and made her aware of above. Patient stated that she prefers to go home but will consider SNF as a back up plan. CSW presented bed offers and patient chose [facility]. [facility] admissions coordinator at [facility] is aware of accepted bed offer and stated that she will start [facility] SNF authorization. CSW will continue to follow and assist as needed.   [employer], LCSW [phone]

## 2016-10-18 NOTE — Progress Notes (Signed)
Patient ID: Amy Gregory, female   DOB: May 02, 1956, 61 y.o.   MRN: [id number]  [facility] PROGRESS NOTE  Amy Gregory:[id number] DOB: 1956-04-12 DOA: 10/16/2016 PCP: Amy Passey, MD  HPI/Subjective: Patient still having some hip pain postoperatively. This morning's blood pressure was a little bit low. This afternoon his blood pressure little bit better. Patient seen while sitting in the chair today. Patient needed a lot of help to get into the chair.  Objective: Vitals:   10/18/16 0758 10/18/16 1223  BP: 97/60 130/79  Pulse: 68 61  Resp: 16   Temp: 97.7 F (36.5 C)     Filed Weights   10/16/16 1958 10/17/16 0535 10/18/16 0349  Weight: 79 kg (174 lb 3.2 oz) 78.9 kg (174 lb) 79.8 kg (176 lb)    ROS: Review of Systems  Constitutional: Negative for chills and fever.  Eyes: Negative for blurred vision.  Respiratory: Negative for cough and shortness of breath.   Cardiovascular: Negative for chest pain.  Gastrointestinal: Negative for abdominal pain, constipation, diarrhea, nausea and vomiting.  Genitourinary: Negative for dysuria.  Musculoskeletal: Positive for joint pain.  Neurological: Negative for dizziness and headaches.   Exam: Physical Exam  Constitutional: She is oriented to person, place, and time.  HENT:  Nose: No mucosal edema.  Mouth/Throat: No oropharyngeal exudate or posterior oropharyngeal edema.  Eyes: Conjunctivae, EOM and lids are normal. Pupils are equal, round, and reactive to light.  Neck: No JVD present. Carotid bruit is not present. No edema present. No thyroid mass and no thyromegaly present.  Cardiovascular: S1 normal and S2 normal.  Exam reveals no gallop.   No murmur heard. Pulses:      Dorsalis pedis pulses are 2+ on the right side, and 2+ on the left side.  Respiratory: No respiratory distress. She has no wheezes. She has no rhonchi. She has no rales.  GI: Soft. Bowel sounds are normal. There is no tenderness.  Musculoskeletal:        Right ankle: She exhibits no swelling.       Left ankle: She exhibits no swelling.  Lymphadenopathy:    She has no cervical adenopathy.  Neurological: She is alert and oriented to person, place, and time. No cranial nerve deficit.  Sensation intact bilateral legs. Able to wiggle toes bilaterally.  Skin: Skin is warm. No rash noted. Nails show no clubbing.  Psychiatric: She has a normal mood and affect.      Data Reviewed: Basic Metabolic Panel:  Recent Labs Lab 10/16/16 1719 10/17/16 0442 10/18/16 0333  NA 138 139 140  K 3.9 4.2 4.3  CL 103 103 108  CO2 26 30 29   GLUCOSE 127* 116* 114*  BUN 14 12 13   CREATININE 0.54 0.58 0.53  CALCIUM 9.0 8.7* 8.0*   CBC:  Recent Labs Lab 10/16/16 1719 10/17/16 0442 10/18/16 0333  WBC 12.0* 8.8 6.3  NEUTROABS  --   --  3.8  HGB 12.2 12.2 10.0*  HCT 35.6 35.4 29.1*  MCV 90.5 91.0 90.2  PLT 255 243 186     Recent Results (from the past 240 hour(s))  Surgical PCR screen     Status: None   Collection Time: 10/16/16  3:46 PM  Result Value Ref Range Status   MRSA, PCR NEGATIVE NEGATIVE Final   Staphylococcus aureus NEGATIVE NEGATIVE Final    Comment:        The Xpert SA Assay (FDA approved for NASAL specimens in patients over 21 years  of age), is one component of a comprehensive surveillance program.  Test performance has been validated by [facility] for patients greater than or equal to 31 year old. It is not intended to diagnose infection nor to guide or monitor treatment.      Studies: Dg Chest 1 View  Result Date: 10/16/2016 CLINICAL DATA:  Right hip pain after fall today. EXAM: CHEST 1 VIEW COMPARISON:  12/20/2011 FINDINGS: Lungs are adequately inflated and otherwise clear. Cardiomediastinal silhouette is within normal. Remaining bones and soft tissues are normal. IMPRESSION: No active disease. Electronically Signed   By: Elberta Fortis M.D.   On: 10/16/2016 17:29   Dg Hip Operative Unilat W Or W/o Pelvis  Right  Result Date: 10/17/2016 CLINICAL DATA:  62 year old female with hip fracture. Subsequent encounter. EXAM: OPERATIVE right HIP (WITH PELVIS IF PERFORMED) 6 VIEWS TECHNIQUE: Fluoroscopic spot image(s) were submitted for interpretation post-operatively. COMPARISON:  10/16/2016 FINDINGS: Right intertrochanteric fracture reduced utilizing in intramedullary rod, distal fixation screw and proximal sliding type screw without complication noted. Post right knee replacement. IMPRESSION: Open reduction and internal fixation right intertrochanteric fracture. Electronically Signed   By: Lacy Duverney M.D.   On: 10/17/2016 12:55   Dg Hip Unilat  With Pelvis 2-3 Views Right  Result Date: 10/16/2016 CLINICAL DATA:  Right hip pain after fall today. EXAM: DG HIP (WITH OR WITHOUT PELVIS) 2-3V RIGHT COMPARISON:  CT 08/31/2016 FINDINGS: There is mild diffuse osteopenia. Mild symmetric degenerative change of the hips. There is a displaced intertrochanteric fracture of the right femoral neck. Mild degenerative change of the spine. IMPRESSION: Displaced right femoral intertrochanteric fracture. Electronically Signed   By: Elberta Fortis M.D.   On: 10/16/2016 17:28    Scheduled Meds: . docusate sodium  100 mg Oral BID  . enoxaparin (LOVENOX) injection  40 mg Subcutaneous Q24H  . folic acid  1 mg Oral Daily  . [START ON 10/21/2016] methotrexate  15 mg Oral Q Sat  . pantoprazole  40 mg Oral Daily   Continuous Infusions:   Assessment/Plan:  1. displaced right femoral intertrochanteric fracture, Requiring operative repair. Pain control with oral and IV medications. Hopefully out to rehabilitation in the next day or so depending on clinical course. 2. rheumatoid arthritis on methotrexate and folic acid 3. GERD on Protonix  Code Status:     Code Status Orders        Start     Ordered   10/16/16 1854  Full code  Continuous     10/16/16 1854    Code Status History    Date Active Date Inactive Code Status  Order ID Comments User Context   This patient has a current code status but no historical code status.     Disposition Plan: To rehabilitation Either tomorrow or the day after depending on clinical status.  Consultants:  Orthopedic surgery  Time spent: 24 minutes  Alford Highland  [employer]

## 2016-10-18 NOTE — Anesthesia Postprocedure Evaluation (Signed)
Anesthesia Post Note  Patient: KHALEAH DUER  Procedure(s) Performed: Procedure(s) (LRB): INTRAMEDULLARY (IM) NAIL INTERTROCHANTRIC (Right)  Patient location during evaluation: Nursing Unit Anesthesia Type: Spinal Level of consciousness: awake, awake and alert and oriented Pain management: pain level controlled Vital Signs Assessment: post-procedure vital signs reviewed and stable Respiratory status: spontaneous breathing, nonlabored ventilation and respiratory function stable Cardiovascular status: stable Postop Assessment: no headache, no backache, adequate PO intake, no signs of nausea or vomiting and patient able to bend at knees Anesthetic complications: no     Last Vitals:  Vitals:   10/18/16 0349 10/18/16 0358  BP: (!) 96/38 (!) 112/58  Pulse: 66   Resp: 18   Temp: 37 C     Last Pain:  Vitals:   10/18/16 0601  TempSrc:   PainSc: Asleep                 Atalya Dano,  Sheran Fava

## 2016-10-18 NOTE — Progress Notes (Signed)
Physical Therapy Treatment Patient Details Name: Amy Gregory MRN: [id number] DOB: 1955/11/15 Today's Date: 10/18/2016    History of Present Illness 61 y/o female admitted to ED on 10/16/2016 after a fall, which resulted in a R femur fracture. Pt states her R LE just "gave out". ORIF was performed on 10/17/2016. Pt is 3 months s/p a R TKA, and she had just begun driving and walking without a cane for short distances. PMH includes RA, GERD, R toe surgery (s/p a fall - pt reports she tripped), hearing loss, dysphagia, hiatal hernia, diverticulitis, impaired memory, OA, and nephrolithiasis.     PT Comments    Pt progressing toward her goals. She ambulated 62ft with RW, +2 mod assist, and chair follow. Pt performed sit to/from stand and bed mobility with +2 mod assist. Today's session limited by pt's pain, however her pain has improved since this morning. Pt states she wants to dc home (versus rehab/SNF). Case worker discussed dc options with her and pt agreed to consider SNF if that's most appropriate for her recovery to PLOF. Will continue to progress.     Follow Up Recommendations  SNF     Equipment Recommendations  None recommended by PT    Recommendations for Other Services       Precautions / Restrictions Precautions Precautions: Fall Precaution Comments: Pt scared of falling again. Restrictions Weight Bearing Restrictions: Yes RLE Weight Bearing: Weight bearing as tolerated    Mobility  Bed Mobility Overal bed mobility: Needs Assistance Bed Mobility: Sit to Supine       Sit to supine: +2 for physical assistance;+2 for safety/equipment;Mod assist   General bed mobility comments: Pt in less pain sit to supine than she was this AM supine to sit. Pt required less verbal cueing and less LE guarding to reposition herself in bed.   Transfers Overall transfer level: Needs assistance Equipment used: Rolling walker (2 wheeled) Transfers: Sit to/from Stand Sit to Stand: Mod  assist;+2 physical assistance;+2 safety/equipment         General transfer comment: Pt had less difficulty standing (from [facility]) and getting to a fully upright position. No dizziness once standing.   Ambulation/Gait Ambulation/Gait assistance: Mod assist;+2 physical assistance;+2 safety/equipment Ambulation Distance (Feet): 15 Feet Assistive device: Rolling walker (2 wheeled) Gait Pattern/deviations: Step-to pattern;Trunk flexed;Wide base of support;Antalgic     General Gait Details: (P) Pt ambulated from [facility] to door and back to bed with chair follow. Pt had less pain ambulating and required less encouragement to continue mobilizing.    Stairs            Wheelchair Mobility    Modified Rankin (Stroke Patients Only)       Balance Overall balance assessment: Needs assistance;History of Falls Sitting-balance support: Bilateral upper extremity supported;Feet supported   Sitting balance - Comments: No LOB noted seated. Mild dizziness at EOB. Pt unable to remove UE's from bed due to pain and apprehension.   Standing balance support: Bilateral upper extremity supported;During functional activity   Standing balance comment: No LOB noted. Pt keep both hands on the RW to steady herself and off-load her R LE when standing/ambulating.                            Cognition Arousal/Alertness: Awake/alert Behavior During Therapy: WFL for tasks assessed/performed;Anxious Overall Cognitive Status: Within Functional Limits for tasks assessed  General Comments: Verbal encouragement throughout this session to keep pt motivated to continue therapy despite pain. Pt able to follow commands.      Exercises Other Exercises Other Exercises: Supine ther-ex on B LE's x12 included: ankle pumps, heel slides, SAQ's, glute squeezes, and hip abd. Min assist for all ther-ex except ankle pumps and glute squeezes. Verbal cueing for sequencing.     General Comments        Pertinent Vitals/Pain Pain Assessment: 0-10 Pain Score: 6  Pain Location: R hip Pain Descriptors / Indicators: Operative site guarding;Grimacing;Guarding Pain Intervention(s): Limited activity within patient's tolerance;Monitored during session;Premedicated before session;Repositioned;Ice applied    Home Living                      Prior Function            PT Goals (current goals can now be found in the care plan section) Acute Rehab PT Goals Patient Stated Goal: to go home PT Goal Formulation: With patient Time For Goal Achievement: 11/01/16 Potential to Achieve Goals: Good Additional Goals Additional Goal #1: Perform bed mobility with min assist to reduce deconditioning and promote return to PLOF Progress towards PT goals: Progressing toward goals    Frequency    BID      PT Plan Current plan remains appropriate    Co-evaluation              AM-PAC PT "6 Clicks" Daily Activity  Outcome Measure  Difficulty turning over in bed (including adjusting bedclothes, sheets and blankets)?: A Lot Difficulty moving from lying on back to sitting on the side of the bed? : Total Difficulty sitting down on and standing up from a chair with arms (e.g., wheelchair, bedside commode, etc,.)?: Total Help needed moving to and from a bed to chair (including a wheelchair)?: A Lot Help needed walking in hospital room?: A Lot Help needed climbing 3-5 steps with a railing? : Total 6 Click Score: 9    End of Session Equipment Utilized During Treatment: Gait belt Activity Tolerance: Patient limited by pain Patient left: in bed;with call bell/phone within reach;with bed alarm set;with SCD's reapplied;with family/visitor present Nurse Communication: Mobility status PT Visit Diagnosis: Other abnormalities of gait and mobility (R26.89);Muscle weakness (generalized) (M62.81);Repeated falls (R29.6);Pain Pain - Right/Left: Right Pain - part of body:  Hip     Time: 5885-0277 PT Time Calculation (min) (ACUTE ONLY): 23 min  Charges:  $Therapeutic Exercise: 8-22 mins                    G Codes:       Mabeline Caras, PT, SPT  [place] 10/18/2016, 3:05 PM

## 2016-10-18 NOTE — Clinical Social Work Placement (Signed)
   CLINICAL SOCIAL WORK PLACEMENT  NOTE  Date:  10/18/2016  Patient Details  Name: Amy Gregory MRN: [id number] Date of Birth: 04-27-1956  Clinical Social Work is seeking post-discharge placement for this patient at the Skilled  Nursing Facility level of care (*CSW will initial, date and re-position this form in  chart as items are completed):  Yes   Patient/family provided with [facility] Clinical Social Work Department's list of facilities offering this level of care within the geographic area requested by the patient (or if unable, by the patient's family).  Yes   Patient/family informed of their freedom to choose among providers that offer the needed level of care, that participate in Medicare, Medicaid or managed care program needed by the patient, have an available bed and are willing to accept the patient.  Yes   Patient/family informed of [facility]'s ownership interest in [facility] and [facility], as well as of the fact that they are under no obligation to receive care at these facilities.  PASRR submitted to EDS on 10/17/16     PASRR number received on 10/17/16     Existing PASRR number confirmed on       FL2 transmitted to all facilities in geographic area requested by pt/family on 10/18/16     FL2 transmitted to all facilities within larger geographic area on       Patient informed that his/her managed care company has contracts with or will negotiate with certain facilities, including the following:        Yes   Patient/family informed of bed offers received.  Patient chooses bed at  [facility] )     Physician recommends and patient chooses bed at      Patient to be transferred to   on  .  Patient to be transferred to facility by       Patient family notified on   of transfer.  Name of family member notified:        PHYSICIAN       Additional Comment:    _______________________________________________ Satori Krabill, Darleen Crocker, LCSW 10/18/2016,  3:32 PM

## 2016-10-18 NOTE — Progress Notes (Signed)
  Subjective: 1 Day Post-Op Procedure(s) (LRB): INTRAMEDULLARY (IM) NAIL INTERTROCHANTRIC (Right) Patient reports pain as moderate.   Patient is well, and has had no acute complaints or problems PT and care management to assist with discharge planning. Negative for chest pain and shortness of breath Fever: no Gastrointestinal:Negative for nausea and vomiting  Objective: Vital signs in last 24 hours: Temp:  [97.3 F (36.3 C)-98.8 F (37.1 C)] 98.6 F (37 C) (06/13 0349) Pulse Rate:  [54-72] 66 (06/13 0349) Resp:  [6-20] 18 (06/13 0349) BP: (94-151)/(38-79) 112/58 (06/13 0358) SpO2:  [95 %-100 %] 95 % (06/13 0349) Weight:  [79.8 kg (176 lb)] 79.8 kg (176 lb) (06/13 0349)  Intake/Output from previous day:  Intake/Output Summary (Last 24 hours) at 10/18/16 0748 Last data filed at 10/18/16 0600  Gross per 24 hour  Intake             3440 ml  Output             2075 ml  Net             1365 ml    Intake/Output this shift: No intake/output data recorded.  Labs:  Recent Labs  10/16/16 1719 10/17/16 0442 10/18/16 0333  HGB 12.2 12.2 10.0*    Recent Labs  10/17/16 0442 10/18/16 0333  WBC 8.8 6.3  RBC 3.89 3.22*  HCT 35.4 29.1*  PLT 243 186    Recent Labs  10/17/16 0442 10/18/16 0333  NA 139 140  K 4.2 4.3  CL 103 108  CO2 30 29  BUN 12 13  CREATININE 0.58 0.53  GLUCOSE 116* 114*  CALCIUM 8.7* 8.0*    Recent Labs  10/16/16 1719  INR 0.91     EXAM General - Patient is Alert, Appropriate and Oriented Extremity - ABD soft Sensation intact distally Intact pulses distally Dorsiflexion/Plantar flexion intact Incision: dressing C/D/I No cellulitis present Compartment soft Dressing/Incision - clean, dry, no drainage Motor Function - intact, moving foot and toes well on exam.   Abdomen soft without distention.  No tympany, normal BS.  Past Medical History:  Diagnosis Date  . Bilateral hearing loss   . Dysphagia   . GERD (gastroesophageal reflux  disease)   . Hiatal hernia    small  . History of diverticulitis   . Impaired memory   . Nephrolithiasis   . OA (osteoarthritis)   . Rheumatoid arthritis (HCC)    managed by Dr. Gavin Potters    Assessment/Plan: 1 Day Post-Op Procedure(s) (LRB): INTRAMEDULLARY (IM) NAIL INTERTROCHANTRIC (Right) Active Problems:   Closed displaced spiral fracture of shaft of right femur (HCC)  Estimated body mass index is 29.29 kg/m as calculated from the following:   Height as of 08/31/16: 5\' 5"  (1.651 m).   Weight as of this encounter: 79.8 kg (176 lb). Advance diet Up with therapy D/C IV fluids when tolerating po intake.  Labs reviewed, acute blood loss anemia, Hg 10.0 continue to monitor. Up with therapy today Pt is passing gas, begin working on having a BM.  DVT Prophylaxis - Lovenox, Foot Pumps and TED hose Weight-Bearing as tolerated to right leg  J. , PA-C [facility] 10/18/2016, 7:48 AM

## 2016-10-19 LAB — BASIC METABOLIC PANEL
Anion gap: 4 — ABNORMAL LOW (ref 5–15)
BUN: 12 mg/dL (ref 6–20)
CO2: 28 mmol/L (ref 22–32)
Calcium: 8.1 mg/dL — ABNORMAL LOW (ref 8.9–10.3)
Chloride: 107 mmol/L (ref 101–111)
Creatinine, Ser: 0.49 mg/dL (ref 0.44–1.00)
GFR calc Af Amer: >60 mL/min (ref >60)
GFR calc non Af Amer: >60 mL/min (ref >60)
Glucose, Bld: 107 mg/dL — ABNORMAL HIGH (ref 65–99)
Potassium: 4.1 mmol/L (ref 3.5–5.1)
Sodium: 139 mmol/L (ref 135–145)

## 2016-10-19 LAB — CBC
HCT: 29.6 % — ABNORMAL LOW (ref 35.0–47.0)
Hemoglobin: 10 g/dL — ABNORMAL LOW (ref 12.0–16.0)
MCH: 30.3 pg (ref 26.0–34.0)
MCHC: 33.9 g/dL (ref 32.0–36.0)
MCV: 89.5 fL (ref 80.0–100.0)
Platelets: 187 10*3/uL (ref 150–440)
RBC: 3.31 MIL/uL — ABNORMAL LOW (ref 3.80–5.20)
RDW: 13.4 % (ref 11.5–14.5)
WBC: 7.2 10*3/uL (ref 3.6–11.0)

## 2016-10-19 MED ORDER — ENOXAPARIN SODIUM 40 MG/0.4ML ~~LOC~~ SOLN: 40.0000 mg | SUBCUTANEOUS | 0 refills | Status: DC

## 2016-10-19 MED ORDER — POLYETHYLENE GLYCOL 3350 17 G PO PACK: 17.0000 g | PACK | Freq: Every day | ORAL | 0 refills | Status: DC | PRN

## 2016-10-19 MED ORDER — FLEET ENEMA 7-19 GM/118ML RE ENEM: 1.0000 | ENEMA | Freq: Once | RECTAL | Status: DC

## 2016-10-19 MED ORDER — OXYCODONE HCL 5 MG PO TABS: 5.0000 mg | ORAL_TABLET | Freq: Four times a day (QID) | ORAL | 0 refills | Status: DC | PRN

## 2016-10-19 MED ORDER — DOCUSATE SODIUM 100 MG PO CAPS: 100.0000 mg | ORAL_CAPSULE | Freq: Two times a day (BID) | ORAL | 0 refills | Status: DC

## 2016-10-19 NOTE — Discharge Summary (Signed)
[facility] -  at [facility]   PATIENT NAME: Amy Gregory    MR#:  [id number]  DATE OF BIRTH:  07/21/1955  DATE OF ADMISSION:  10/16/2016 ADMITTING PHYSICIAN: Milagros Loll, MD  DATE OF DISCHARGE: 10/19/2016  PRIMARY CARE PHYSICIAN: Kerman Passey, MD    ADMISSION DIAGNOSIS:  Fall, initial encounter [W19.XXXA] Closed fracture of right hip, initial encounter (HCC) [S72.001A]  DISCHARGE DIAGNOSIS:  Active Problems:   Closed displaced spiral fracture of shaft of right femur (HCC)   SECONDARY DIAGNOSIS:   Past Medical History:  Diagnosis Date  . Bilateral hearing loss   . Dysphagia   . GERD (gastroesophageal reflux disease)   . Hiatal hernia    small  . History of diverticulitis   . Impaired memory   . Nephrolithiasis   . OA (osteoarthritis)   . Rheumatoid arthritis (HCC)    managed by Dr. Lambert Mody COURSE:   1. Displaced right femoral intertrochanteric fracture requiring operative repair. Please see operative report by Dr. Joice Lofts. Patient did well postoperatively. Try to switch to Tylenol as quickly as possible. When necessary oxycodone given. Will be transferred to rehabilitation for physical therapy. Lovenox injections for 14 days to prevent DVT. 2. Rheumatoid arthritis on methotrexate and folic acid. 3. GERD on Protonix  DISCHARGE CONDITIONS:   Satisfactory  CONSULTS OBTAINED:  Treatment Team:  Christena Flake, MD  DRUG ALLERGIES:   Allergies  Allergen Reactions  . Sulfa Antibiotics   . Cefdinir Rash    DISCHARGE MEDICATIONS:   Current Discharge Medication List    START taking these medications   Details  docusate sodium (COLACE) 100 MG capsule Take 1 capsule (100 mg total) by mouth 2 (two) times daily. Qty: 60 capsule, Refills: 0    enoxaparin (LOVENOX) 40 MG/0.4ML injection Inject 0.4 mLs (40 mg total) into the skin daily. Qty: 14 Syringe, Refills: 0    oxyCODONE (OXY IR/ROXICODONE) 5 MG immediate release  tablet Take 1-2 tablets (5-10 mg total) by mouth every 6 (six) hours as needed for breakthrough pain. Qty: 20 tablet, Refills: 0    polyethylene glycol (MIRALAX / GLYCOLAX) packet Take 17 g by mouth daily as needed for mild constipation. Qty: 30 each, Refills: 0      CONTINUE these medications which have NOT CHANGED   Details  Ascorbic Acid (VITAMIN C) 100 MG tablet Take 100 mg by mouth daily.    diphenhydramine-acetaminophen (TYLENOL PM) 25-500 MG TABS tablet Take by mouth.    folic acid (FOLVITE) 1 MG tablet TAKE 1 TABLET BY MOUTH ONCE DAILY    methotrexate (RHEUMATREX) 2.5 MG tablet Take 15 mg by mouth once a week. Take 6  2.5mg  pills once a week.    Multiple Vitamin (MULTIVITAMIN) capsule Take by mouth.    pantoprazole (PROTONIX) 40 MG tablet TAKE 1 TABLET BY MOUTH ONCE DAILY.      STOP taking these medications     sulfamethoxazole-trimethoprim (BACTRIM) 400-80 MG tablet          DISCHARGE INSTRUCTIONS:   Follow-up with Dr. rehabilitation one day Follow-up with Dr. Joice Lofts orthopedic surgery 1-2 weeks  If you experience worsening of your admission symptoms, develop shortness of breath, life threatening emergency, suicidal or homicidal thoughts you must seek medical attention immediately by calling 911 or calling your MD immediately  if symptoms less severe.  You Must read complete instructions/literature along with all the possible adverse reactions/side effects for all the Medicines you take and that have  been prescribed to you. Take any new Medicines after you have completely understood and accept all the possible adverse reactions/side effects.   Please note  You were cared for by a hospitalist during your hospital stay. If you have any questions about your discharge medications or the care you received while you were in the hospital after you are discharged, you can call the unit and asked to speak with the hospitalist on call if the hospitalist that took care of you is  not available. Once you are discharged, your primary care physician will handle any further medical issues. Please note that NO REFILLS for any discharge medications will be authorized once you are discharged, as it is imperative that you return to your primary care physician (or establish a relationship with a primary care physician if you do not have one) for your aftercare needs so that they can reassess your need for medications and monitor your lab values.    Today   CHIEF COMPLAINT:   Chief Complaint  Patient presents with  . Fall    HISTORY OF PRESENT ILLNESS:  Amy Gregory  is a 61 y.o. female presented with a fall and found to have a broken hip.   VITAL SIGNS:  Blood pressure (!) 142/58, pulse 88, temperature 98.7 F (37.1 C), temperature source Oral, resp. rate 16, weight 79.8 kg (176 lb), SpO2 99 %.    PHYSICAL EXAMINATION:  GENERAL:  61 y.o.-year-old patient lying in the bed with no acute distress.  EYES: Pupils equal, round, reactive to light and accommodation. No scleral icterus. Extraocular muscles intact.  HEENT: Head atraumatic, normocephalic. Oropharynx and nasopharynx clear.  NECK:  Supple, no jugular venous distention. No thyroid enlargement, no tenderness.  LUNGS: Normal breath sounds bilaterally, no wheezing, rales,rhonchi or crepitation. No use of accessory muscles of respiration.  CARDIOVASCULAR: S1, S2 normal. No murmurs, rubs, or gallops.  ABDOMEN: Soft, non-tender, non-distended. Bowel sounds present. No organomegaly or mass.  EXTREMITIES: No pedal edema, cyanosis, or clubbing.  NEUROLOGIC: Cranial nerves II through XII are intact. Muscle strength 5/5 in all extremities. Sensation intact. Gait not checked.  PSYCHIATRIC: The patient is alert and oriented x 3.  SKIN: No obvious rash, lesion, or ulcer.   DATA REVIEW:   CBC  Recent Labs Lab 10/19/16 0320  WBC 7.2  HGB 10.0*  HCT 29.6*  PLT 187    Chemistries   Recent Labs Lab 10/19/16 0320   NA 139  K 4.1  CL 107  CO2 28  GLUCOSE 107*  BUN 12  CREATININE 0.49  CALCIUM 8.1*    Microbiology Results  Results for orders placed or performed during the hospital encounter of 10/16/16  Surgical PCR screen     Status: None   Collection Time: 10/16/16  3:46 PM  Result Value Ref Range Status   MRSA, PCR NEGATIVE NEGATIVE Final   Staphylococcus aureus NEGATIVE NEGATIVE Final    Comment:        The Xpert SA Assay (FDA approved for NASAL specimens in patients over 60 years of age), is one component of a comprehensive surveillance program.  Test performance has been validated by [facility] for patients greater than or equal to 81 year old. It is not intended to diagnose infection nor to guide or monitor treatment.       Management plans discussed with the patient, family and they are in agreement.  CODE STATUS:     Code Status Orders  Start     Ordered   10/16/16 1854  Full code  Continuous     10/16/16 1854    Code Status History    Date Active Date Inactive Code Status Order ID Comments User Context   This patient has a current code status but no historical code status.      TOTAL TIME TAKING CARE OF THIS PATIENT: 32 minutes.    Alford Highland M.D on 10/19/2016 at 3:02 PM  Between 7am to 6pm - Pager - [phone]  After 6pm go to [email or web address] - password [email or web address]  [facility] Office  [phone]  CC: Primary care physician; Kerman Passey, MD

## 2016-10-19 NOTE — Clinical Social Work Placement (Signed)
   CLINICAL SOCIAL WORK PLACEMENT  NOTE  Date:  10/19/2016  Patient Details  Name: Amy Gregory MRN: [id number] Date of Birth: 1955/05/14  Clinical Social Work is seeking post-discharge placement for this patient at the Skilled  Nursing Facility level of care (*CSW will initial, date and re-position this form in  chart as items are completed):  Yes   Patient/family provided with [facility] Clinical Social Work Department's list of facilities offering this level of care within the geographic area requested by the patient (or if unable, by the patient's family).  Yes   Patient/family informed of their freedom to choose among providers that offer the needed level of care, that participate in Medicare, Medicaid or managed care program needed by the patient, have an available bed and are willing to accept the patient.  Yes   Patient/family informed of [facility]'s ownership interest in [facility] and [facility], as well as of the fact that they are under no obligation to receive care at these facilities.  PASRR submitted to EDS on 10/17/16     PASRR number received on 10/17/16     Existing PASRR number confirmed on       FL2 transmitted to all facilities in geographic area requested by pt/family on 10/18/16     FL2 transmitted to all facilities within larger geographic area on       Patient informed that his/her managed care company has contracts with or will negotiate with certain facilities, including the following:        Yes   Patient/family informed of bed offers received.  Patient chooses bed at  [facility] )     Physician recommends and patient chooses bed at      Patient to be transferred to  [facility] ) on 10/19/16.  Patient to be transferred to facility by  [id number] )     Patient family notified on 10/19/16 of transfer.  Name of family member notified:   (CSW left patient's husband Janus Vlcek a voicemail.  )     PHYSICIAN        Additional Comment:    _______________________________________________ Bentli Llorente, Darleen Crocker, LCSW 10/19/2016, 3:47 PM

## 2016-10-19 NOTE — Progress Notes (Signed)
Physical Therapy Treatment Patient Details Name: Amy Gregory MRN: [id number] DOB: April 17, 1956 Today's Date: 10/19/2016    History of Present Illness 61 y/o female admitted to ED on 10/16/2016 after a fall, which resulted in a R femur fracture. Pt states her R LE just "gave out". ORIF was performed on 10/17/2016. Pt is 3 months s/p a R TKA, and she had just begun driving and walking without a cane for short distances. PMH includes RA, GERD, R toe surgery (s/p a fall - pt reports she tripped), hearing loss, dysphagia, hiatal hernia, diverticulitis, impaired memory, OA, and nephrolithiasis.     PT Comments    Pt agreeable to PT; reports 7/10 pain R hip/thigh. Pt progressing to Mod A of 1 for bed mobility and transfers with heavy cueing for sequence. Pt performs sit to/from stand transfers from bed, BSC and chair. Pt able to demonstrate small steps between transfers with improved weight acceptance to Right lower extremity. Cues for sequence, posture and chair approach. Pt received up in chair comfortably with education on isometric strengthening and gentle range exercises. Continue PT to progress strength, endurance and stand/gait activities to improve functional mobility.    Follow Up Recommendations  SNF     Equipment Recommendations  None recommended by PT    Recommendations for Other Services       Precautions / Restrictions Precautions Precautions: Fall Restrictions Weight Bearing Restrictions: Yes RLE Weight Bearing: Weight bearing as tolerated    Mobility  Bed Mobility Overal bed mobility: Needs Assistance Bed Mobility: Supine to Sit     Supine to sit: Mod assist     General bed mobility comments: Mod A for LEs; use of rail for trunk with cues. Heavy cueing for 1 step follow through to decreased anxiety of overwhelming function  Transfers Overall transfer level: Needs assistance Equipment used: Rolling walker (2 wheeled) Transfers: Sit to/from Stand Sit to Stand: Mod  assist         General transfer comment: Performed from bed chair and BSC. Cues for hand placement. Increased time to place hands to rw from bed. Cues for upright posture especially hip extension. Education on use of LUE to chair for sit to allow more wt shift to non surgical side for improved comfort  Ambulation/Gait Ambulation/Gait assistance: Min assist Ambulation Distance (Feet): 3 Feet (performed twice) Assistive device: Rolling walker (2 wheeled) Gait Pattern/deviations: Step-to pattern;Antalgic;Decreased stance time - right Gait velocity: decreased Gait velocity interpretation: <1.8 ft/sec, indicative of risk for recurrent falls General Gait Details: improving ability to take steps and accept weight transfer to RLE   Stairs            Wheelchair Mobility    Modified Rankin (Stroke Patients Only)       Balance Overall balance assessment: Needs assistance;History of Falls Sitting-balance support: Bilateral upper extremity supported;Feet supported Sitting balance-Leahy Scale: Fair     Standing balance support: Bilateral upper extremity supported Standing balance-Leahy Scale: Fair                              Cognition Arousal/Alertness: Awake/alert Behavior During Therapy: WFL for tasks assessed/performed;Anxious Overall Cognitive Status: Within Functional Limits for tasks assessed                                        Exercises General Exercises - Lower Extremity  Ankle Circles/Pumps: AROM;Both;20 reps Quad Sets: Strengthening;Both;20 reps Gluteal Sets: Strengthening;Both;20 reps Heel Slides: AAROM;Right;10 reps (2 sets)    General Comments        Pertinent Vitals/Pain Pain Assessment: 0-10 Pain Score: 7  Pain Location: R hip/thigh Pain Descriptors / Indicators: Aching;Constant;Operative site guarding Pain Intervention(s): Monitored during session;Premedicated before session;Limited activity within patient's  tolerance;Repositioned;Ice applied    Home Living                      Prior Function            PT Goals (current goals can now be found in the care plan section) Progress towards PT goals: Progressing toward goals    Frequency    BID      PT Plan Current plan remains appropriate    Co-evaluation              AM-PAC PT "6 Clicks" Daily Activity  Outcome Measure  Difficulty turning over in bed (including adjusting bedclothes, sheets and blankets)?: A Lot Difficulty moving from lying on back to sitting on the side of the bed? : Total Difficulty sitting down on and standing up from a chair with arms (e.g., wheelchair, bedside commode, etc,.)?: Total Help needed moving to and from a bed to chair (including a wheelchair)?: A Little Help needed walking in hospital room?: A Little Help needed climbing 3-5 steps with a railing? : A Lot 6 Click Score: 12    End of Session Equipment Utilized During Treatment: Gait belt Activity Tolerance: Patient limited by fatigue;Patient limited by pain Patient left: in chair;with call bell/phone within reach;with chair alarm set;with family/visitor present Nurse Communication: Other (comment) (SW regarding session) PT Visit Diagnosis: Other abnormalities of gait and mobility (R26.89);Muscle weakness (generalized) (M62.81);Repeated falls (R29.6);Pain Pain - Right/Left: Right Pain - part of body: Hip     Time: 1010-1043 PT Time Calculation (min) (ACUTE ONLY): 33 min  Charges:  $Gait Training: 8-22 mins $Therapeutic Exercise: 8-22 mins                    G Codes:        Scot Dock, PTA 10/19/2016, 11:05 AM

## 2016-10-19 NOTE — Progress Notes (Signed)
Pt discharged to [facility] SNF via ambulance without incident per MD order. Pt transferred to SNF via recliner. No change in pt from AM assessment. Pt pain "tolerable" on discharge. Prior to d/c, report called to [facility] RN at [facility]. Pt and family were aware of the discharge prior to transfer.

## 2016-10-19 NOTE — Progress Notes (Addendum)
Per [facility] admissions coordinator at [facility] is requesting post op day 2 PT notes before they make a determination about SNF. Clinical Child psychotherapist (CSW) will send [facility] post op day 2 PT note when it is available.   CSW sent [facility] requested clinicals.   [employer], LCSW [phone]

## 2016-10-19 NOTE — Progress Notes (Signed)
Clinical Child psychotherapist (CSW) received a call from [employer] at [facility] stating that [facility] has approved SNF and patient can come today. Patient is medically stable for D/C to [facility] today to room E-17. RN will call report and will arrange EMS for transport. CSW sent D/C orders to [facility] via [facility]. Patient is aware of above. CSW attempted to contact patient's husband Demari Kropp however he did not answer and a voicemail was left. Please reconsult if future social work needs arise. CSW signing off.   [employer], LCSW [phone]

## 2016-10-19 NOTE — Progress Notes (Signed)
Patient ID: Amy Gregory, female   DOB: [birth year], 61 y.o.   MRN: [id number]  [employer] PROGRESS NOTE  Amy Gregory TLX:[id number] DOB: October 16, 1955 DOA: 10/16/2016 PCP: Amy Passey, MD  HPI/Subjective: Patient having no pain when she doesn't move her hip. She does have some good pain if she does move her hip. Otherwise feels okay.  Objective: Vitals:   10/19/16 0759 10/19/16 1010  BP: (!) 142/58   Pulse: 73 88  Resp: 16   Temp: 98.7 F (37.1 C)     Filed Weights   10/16/16 1958 10/17/16 0535 10/18/16 0349  Weight: 79 kg (174 lb 3.2 oz) 78.9 kg (174 lb) 79.8 kg (176 lb)    ROS: Review of Systems  Constitutional: Negative for chills and fever.  Eyes: Negative for blurred vision.  Respiratory: Negative for cough and shortness of breath.   Cardiovascular: Negative for chest pain.  Gastrointestinal: Positive for constipation. Negative for abdominal pain, diarrhea, nausea and vomiting.  Genitourinary: Negative for dysuria.  Musculoskeletal: Positive for joint pain.  Neurological: Negative for dizziness and headaches.   Exam: Physical Exam  Constitutional: She is oriented to person, place, and time.  HENT:  Nose: No mucosal edema.  Mouth/Throat: No oropharyngeal exudate or posterior oropharyngeal edema.  Eyes: Conjunctivae, EOM and lids are normal. Pupils are equal, round, and reactive to light.  Neck: No JVD present. Carotid bruit is not present. No edema present. No thyroid mass and no thyromegaly present.  Cardiovascular: S1 normal and S2 normal.  Exam reveals no gallop.   No murmur heard. Pulses:      Dorsalis pedis pulses are 2+ on the right side, and 2+ on the left side.  Respiratory: No respiratory distress. She has no wheezes. She has no rhonchi. She has no rales.  GI: Soft. Bowel sounds are normal. There is no tenderness.  Musculoskeletal:       Right ankle: She exhibits no swelling.       Left ankle: She exhibits no swelling.  Lymphadenopathy:   She has no cervical adenopathy.  Neurological: She is alert and oriented to person, place, and time. No cranial nerve deficit.  Sensation intact bilateral legs. Able to wiggle toes bilaterally. Unable to straight leg raise with her right leg at this point.  Skin: Skin is warm. No rash noted. Nails show no clubbing.  Psychiatric: She has a normal mood and affect.      Data Reviewed: Basic Metabolic Panel:  Recent Labs Lab 10/16/16 1719 10/17/16 0442 10/18/16 0333 10/19/16 0320  NA 138 139 140 139  K 3.9 4.2 4.3 4.1  CL 103 103 108 107  CO2 26 30 29 28   GLUCOSE 127* 116* 114* 107*  BUN 14 12 13 12   CREATININE 0.54 0.58 0.53 0.49  CALCIUM 9.0 8.7* 8.0* 8.1*   CBC:  Recent Labs Lab 10/16/16 1719 10/17/16 0442 10/18/16 0333 10/19/16 0320  WBC 12.0* 8.8 6.3 7.2  NEUTROABS  --   --  3.8  --   HGB 12.2 12.2 10.0* 10.0*  HCT 35.6 35.4 29.1* 29.6*  MCV 90.5 91.0 90.2 89.5  PLT 255 243 186 187     Recent Results (from the past 240 hour(s))  Surgical PCR screen     Status: None   Collection Time: 10/16/16  3:46 PM  Result Value Ref Range Status   MRSA, PCR NEGATIVE NEGATIVE Final   Staphylococcus aureus NEGATIVE NEGATIVE Final    Comment:  The Xpert SA Assay (FDA approved for NASAL specimens in patients over 7 years of age), is one component of a comprehensive surveillance program.  Test performance has been validated by [facility] for patients greater than or equal to 20 year old. It is not intended to diagnose infection nor to guide or monitor treatment.       Scheduled Meds: . docusate sodium  100 mg Oral BID  . enoxaparin (LOVENOX) injection  40 mg Subcutaneous Q24H  . folic acid  1 mg Oral Daily  . ketorolac  15 mg Intravenous Q6H  . [START ON 10/21/2016] methotrexate  15 mg Oral Q Sat  . pantoprazole  40 mg Oral Daily     Assessment/Plan:  1. Displaced right femoral intertrochanteric fracture, Requiring operative repair. Pain control with  oral medications.  Still awaiting insurance approval for rehabilitation. I would have sent the patient out to rehabilitation today since she looked good but unable to do so secondary to not having insurance approval for this. 2. Rheumatoid arthritis on methotrexate and folic acid 3. GERD on Protonix  Code Status:     Code Status Orders        Start     Ordered   10/16/16 1854  Full code  Continuous     10/16/16 1854    Code Status History    Date Active Date Inactive Code Status Order ID Comments User Context   This patient has a current code status but no historical code status.     Disposition Plan: To rehabilitation when insurance company approves  Consultants:  Orthopedic surgery  Time spent: 25 minutes  Renae Gloss, [employer]

## 2016-10-19 NOTE — Progress Notes (Signed)
Physical Therapy Treatment Patient Details Name: Amy Gregory MRN: [id number] DOB: 1956/01/24 Today's Date: 10/19/2016    History of Present Illness 61 y/o female admitted to ED on 10/16/2016 after a fall, which resulted in a R femur fracture. Pt states her R LE just "gave out". ORIF was performed on 10/17/2016. Pt is 3 months s/p a R TKA, and she had just begun driving and walking without a cane for short distances. PMH includes RA, GERD, R toe surgery (s/p a fall - pt reports she tripped), hearing loss, dysphagia, hiatal hernia, diverticulitis, impaired memory, OA, and nephrolithiasis.     PT Comments    Pt agreeable to PT; reports 7/10 pain Right lower extremity. Pt continues to have difficulty transitioning hands from seated surface to standing surface despite increased instruction, explanation and cueing. Pt did require less physical assist with transfers this session. Increased ambulation slightly to 5 ft; pt without buckling or loss of balance, but subjectively feels very weak and unconfident. Participates well with seated and supine exercises with assist as needed. Pt now to discharge to skilled nursing facility today for continued rehab efforts.    Follow Up Recommendations  SNF     Equipment Recommendations  None recommended by PT    Recommendations for Other Services       Precautions / Restrictions Precautions Precautions: Fall Restrictions Weight Bearing Restrictions: Yes RLE Weight Bearing: Weight bearing as tolerated    Mobility  Bed Mobility Overal bed mobility: Needs Assistance Bed Mobility: Sit to Supine     Supine to sit: Mod assist     General bed mobility comments: for LEs; use of trapeze to reposition upward in bed   Transfers Overall transfer level: Needs assistance Equipment used: Rolling walker (2 wheeled) Transfers: Sit to/from Stand Sit to Stand: Min assist         General transfer comment: Performed 2x; less assist; however, pt continues  to get "stuck" in forward flexed position with difficutly transitioning hands from seated to stand position. Heavy cueing, but less physical assist needed this session   Ambulation/Gait Ambulation/Gait assistance: Min assist Ambulation Distance (Feet): 3 Feet (5 feets second walk) Assistive device: Rolling walker (2 wheeled) Gait Pattern/deviations: Step-to pattern;Antalgic;Decreased stance time - right Gait velocity: decreased Gait velocity interpretation: <1.8 ft/sec, indicative of risk for recurrent falls General Gait Details: continues with slow small steps with subjective feeling of weakness bilaterally and decreased confidence. No buckling or LOB   Stairs            Wheelchair Mobility    Modified Rankin (Stroke Patients Only)       Balance Overall balance assessment: Needs assistance;History of Falls Sitting-balance support: Bilateral upper extremity supported;Feet supported Sitting balance-Leahy Scale: Fair     Standing balance support: Bilateral upper extremity supported Standing balance-Leahy Scale: Fair                              Cognition Arousal/Alertness: Awake/alert Behavior During Therapy: WFL for tasks assessed/performed;Anxious Overall Cognitive Status: Within Functional Limits for tasks assessed                                        Exercises General Exercises - Lower Extremity Ankle Circles/Pumps: AROM;Both;20 reps;Supine Quad Sets: Strengthening;Both;20 reps;Supine Gluteal Sets: Strengthening;Both;20 reps;Supine Long Arc Quad: AAROM;Right;20 reps;Seated Heel Slides: AAROM;Right;20 reps;Supine (AROM L)  Hip ABduction/ADduction: AAROM;20 reps;Supine;Both Straight Leg Raises: AAROM;10 reps;Supine;Both Hip Flexion/Marching: AAROM;Right;10 reps;Seated (AROM L) Other Exercises Other Exercises: set up and assist to steady/balance for personal hygiene in stand    General Comments        Pertinent Vitals/Pain Pain  Assessment: 0-10 Pain Score: 7  Pain Location: R hip/thigh Pain Descriptors / Indicators: Aching;Constant;Operative site guarding Pain Intervention(s): Limited activity within patient's tolerance;Monitored during session;Repositioned    Home Living                      Prior Function            PT Goals (current goals can now be found in the care plan section) Progress towards PT goals: Progressing toward goals    Frequency    BID      PT Plan Current plan remains appropriate    Co-evaluation              AM-PAC PT "6 Clicks" Daily Activity  Outcome Measure  Difficulty turning over in bed (including adjusting bedclothes, sheets and blankets)?: A Lot Difficulty moving from lying on back to sitting on the side of the bed? : Total Difficulty sitting down on and standing up from a chair with arms (e.g., wheelchair, bedside commode, etc,.)?: Total Help needed moving to and from a bed to chair (including a wheelchair)?: A Little Help needed walking in hospital room?: A Little Help needed climbing 3-5 steps with a railing? : A Lot 6 Click Score: 12    End of Session Equipment Utilized During Treatment: Gait belt Activity Tolerance: Patient limited by fatigue;Patient limited by pain Patient left: with call bell/phone within reach;in bed;with bed alarm set Nurse Communication: Other (comment) (SW regarding session) PT Visit Diagnosis: Other abnormalities of gait and mobility (R26.89);Muscle weakness (generalized) (M62.81);Repeated falls (R29.6);Pain Pain - Right/Left: Right Pain - part of body: Hip     Time: 6440-3474 PT Time Calculation (min) (ACUTE ONLY): 38 min  Charges:  $Gait Training: 8-22 mins $Therapeutic Exercise: 8-22 mins $Therapeutic Activity: 8-22 mins                    G Codes:        Scot Dock, PTA 10/19/2016, 3:35 PM

## 2016-12-21 ENCOUNTER — Telehealth: Payer: Self-pay

## 2016-12-21 ENCOUNTER — Other Ambulatory Visit: Payer: Self-pay | Admitting: Family Medicine

## 2016-12-21 ENCOUNTER — Ambulatory Visit (INDEPENDENT_AMBULATORY_CARE_PROVIDER_SITE_OTHER): Payer: BLUE CROSS/BLUE SHIELD | Admitting: Family Medicine

## 2016-12-21 ENCOUNTER — Encounter: Payer: Self-pay | Admitting: Family Medicine

## 2016-12-21 VITALS — BP 126/68 | HR 72 | Temp 98.4°F | Resp 16 | Ht 66.0 in | Wt 168.7 lb

## 2016-12-21 DIAGNOSIS — R32 Unspecified urinary incontinence: Secondary | ICD-10-CM | POA: Diagnosis not present

## 2016-12-21 DIAGNOSIS — Z0000 Encounter for general adult medical examination without abnormal findings: Secondary | ICD-10-CM

## 2016-12-21 DIAGNOSIS — Z1231 Encounter for screening mammogram for malignant neoplasm of breast: Secondary | ICD-10-CM

## 2016-12-21 DIAGNOSIS — E559 Vitamin D deficiency, unspecified: Secondary | ICD-10-CM | POA: Diagnosis not present

## 2016-12-21 DIAGNOSIS — Z1159 Encounter for screening for other viral diseases: Secondary | ICD-10-CM | POA: Diagnosis not present

## 2016-12-21 DIAGNOSIS — Z1239 Encounter for other screening for malignant neoplasm of breast: Secondary | ICD-10-CM

## 2016-12-21 DIAGNOSIS — M8580 Other specified disorders of bone density and structure, unspecified site: Secondary | ICD-10-CM

## 2016-12-21 LAB — CBC WITH DIFFERENTIAL/PLATELET
Basophils Absolute: 56 cells/uL (ref 0–200)
Basophils Relative: 1 %
Eosinophils Absolute: 56 cells/uL (ref 15–500)
Eosinophils Relative: 1 %
HCT: 36.4 % (ref 35.0–45.0)
Hemoglobin: 12.1 g/dL (ref 11.7–15.5)
Lymphocytes Relative: 31 %
Lymphs Abs: 1736 cells/uL (ref 850–3900)
MCH: 31.1 pg (ref 27.0–33.0)
MCHC: 33.2 g/dL (ref 32.0–36.0)
MCV: 93.6 fL (ref 80.0–100.0)
MPV: 10.2 fL (ref 7.5–12.5)
Monocytes Absolute: 448 cells/uL (ref 200–950)
Monocytes Relative: 8 %
Neutro Abs: 3304 cells/uL (ref 1500–7800)
Neutrophils Relative %: 59 %
Platelets: 320 10*3/uL (ref 140–400)
RBC: 3.89 MIL/uL (ref 3.80–5.10)
RDW: 14.3 % (ref 11.0–15.0)
WBC: 5.6 10*3/uL (ref 3.8–10.8)

## 2016-12-21 LAB — TSH: TSH: 0.67 mIU/L

## 2016-12-21 NOTE — Assessment & Plan Note (Signed)
Check level and replace if needed 

## 2016-12-21 NOTE — Assessment & Plan Note (Signed)
Order DEXA, check vit D

## 2016-12-21 NOTE — Progress Notes (Signed)
BP 126/68   Pulse 72   Temp 98.4 F (36.9 C) (Oral)   Resp 16   Ht _0  (1.676 m)   Wt 168 lb 11.2 oz (76.5 kg)   SpO2 98%   BMI 27.23 kg/m    Subjective:    Patient ID: Amy Gregory, female    DOB: 1956-01-13, 61 y.o.   MRN: [id number]  HPI: Amy Gregory is a 60 y.o. female  Chief Complaint  Patient presents with  . Annual Exam    HPI  Patient is here for a complete physical USPSTF grade A and B recommendations Depression:  Depression screen [facility] 2/9 12/21/2016 07/04/2016 07/13/2015  Decreased Interest 0 0 0  Down, Depressed, Hopeless 0 0 0  PHQ - 2 Score 0 0 0   Hypertension: BP Readings from Last 3 Encounters:  12/21/16 126/68  10/19/16 92/70  09/01/16 127/61   Obesity: Wt Readings from Last 3 Encounters:  12/21/16 168 lb 11.2 oz (76.5 kg)  10/18/16 176 lb (79.8 kg)  08/31/16 175 lb (79.4 kg)   BMI Readings from Last 3 Encounters:  12/21/16 27.23 kg/m  10/18/16 29.29 kg/m  08/31/16 29.12 kg/m    Alcohol: no Tobacco use: remote HIV, hep B, hep C: check today STD testing and prevention (chl/gon/syphilis): no reason Intimate partner violence:no abuse Breast cancer: no lumps BRCA gene screening: n/a Cervical cancer screening: due Nov 2018  Osteoporosis: check DEXA Fall prevention/vitamin D: check vit D; be careful, fall precautions Lipids:  Lab Results  Component Value Date   CHOL 140 07/13/2015   Lab Results  Component Value Date   HDL 48 07/13/2015   Lab Results  Component Value Date   LDLCALC 64 07/13/2015   Lab Results  Component Value Date   TRIG 141 07/13/2015   No results found for: CHOLHDL No results found for: LDLDIRECT Glucose:  Glucose  Date Value Ref Range Status  11/04/2013 125 (H) 65 - 99 mg/dL Final  12/20/2011 123 (H) 65 - 99 mg/dL Final   Glucose, Bld  Date Value Ref Range Status  10/19/2016 107 (H) 65 - 99 mg/dL Final  10/18/2016 114 (H) 65 - 99 mg/dL Final  10/17/2016 116 (H) 65 - 99 mg/dL Final    Colorectal cancer: done 2017; next due 2022 Lung cancer:  Quit smoking in 1971 AAA: n/a Aspirin: no Diet: pretty good eater, not much fast food; drinking milk Exercise: limited right now after fracture, using a walker for ambulation Skin cancer: no worrisome moles; one pale SK on lower abd  Taking protonix for a long time; GI; acid reflux; I recommend calling Dr. Vira Agar  Depression screen [facility] 2/9 12/21/2016 07/04/2016 07/13/2015  Decreased Interest 0 0 0  Down, Depressed, Hopeless 0 0 0  PHQ - 2 Score 0 0 0    Relevant past medical, surgical, family and social history reviewed Past Medical History:  Diagnosis Date  . Bilateral hearing loss   . Dysphagia   . GERD (gastroesophageal reflux disease)   . Hiatal hernia    small  . History of diverticulitis   . Impaired memory   . Nephrolithiasis   . OA (osteoarthritis)   . Rheumatoid arthritis ([place])    managed by Dr. Jefm Bryant   Past Surgical History:  Procedure Laterality Date  . ABDOMINAL HYSTERECTOMY  2002   for fibroids, non cancerous reasons, ovaries remain  . BREAST BIOPSY Left 2005-ish   benign  . CATARACT EXTRACTION  2014   OU  .  COLONOSCOPY  04/26/04  . ESOPHAGEAL DILATION    . ESOPHAGOGASTRODUODENOSCOPY  02/01/05 and 07/13/14  . INTRAMEDULLARY (IM) NAIL INTERTROCHANTERIC Right 10/17/2016   Procedure: INTRAMEDULLARY (IM) NAIL INTERTROCHANTRIC;  Surgeon: Corky Mull, MD;  Location: [facility] ORS;  Service: Orthopedics;  Laterality: Right;  . KNEE SURGERY Right 03/2014   partial medial and lateral meniscectomy and medial condryle debridement  . LEG SURGERY  2013   fractured left leg, toes, hospitalized for 2 weeks  . ORIF TIBIA FRACTURE     remote ORIF proximal left tiba fracture   Family History  Problem Relation Age of Onset  . Heart disease Mother   . Heart attack Mother   . Rheum arthritis Mother   . Stroke Mother   . Arthritis Mother        RA  . Hypertension Mother   . Stroke Father   . Diabetes Father    . Cancer Sister        lymphoma  . Heart attack Maternal Uncle   . Cancer Maternal Grandmother        leukemia  . Arthritis Maternal Grandmother   . Depression Maternal Grandfather   . Stroke Paternal Grandmother   . Cancer Paternal Grandfather   . Arthritis Sister        rheumatitis  . Thyroid disease Sister   . Hypertension Sister   . Cancer Sister        lung  . Depression Sister    Social History   Social History  . Marital status: Married    Spouse name: N/A  . Number of children: N/A  . Years of education: N/A   Occupational History  . Not on file.   Social History Main Topics  . Smoking status: Former Smoker    Quit date: 05/08/1969  . Smokeless tobacco: Never Used  . Alcohol use No  . Drug use: No  . Sexual activity: Not Currently   Other Topics Concern  . Not on file   Social History Narrative  . No narrative on file    Interim medical history since last visit reviewed. Allergies and medications reviewed  Review of Systems  Genitourinary:       Urinary incontinence   Per HPI unless specifically indicated above     Objective:    BP 126/68   Pulse 72   Temp 98.4 F (36.9 C) (Oral)   Resp 16   Ht _0  (1.676 m)   Wt 168 lb 11.2 oz (76.5 kg)   SpO2 98%   BMI 27.23 kg/m   Wt Readings from Last 3 Encounters:  12/21/16 168 lb 11.2 oz (76.5 kg)  10/18/16 176 lb (79.8 kg)  08/31/16 175 lb (79.4 kg)    Physical Exam  Constitutional: She appears well-developed and well-nourished.  HENT:  Head: Normocephalic and atraumatic.  Right Ear: Tympanic membrane, external ear and ear canal normal. Decreased hearing is noted.  Left Ear: Tympanic membrane, external ear and ear canal normal. Decreased hearing is noted.  Mouth/Throat: Oropharynx is clear and moist and mucous membranes are normal.  Eyes: Conjunctivae and EOM are normal. Right eye exhibits no hordeolum. Left eye exhibits no hordeolum. No scleral icterus.  Neck: Carotid bruit is not  present. No thyromegaly present.  Cardiovascular: Normal rate, regular rhythm, S1 normal, S2 normal and normal heart sounds.   No extrasystoles are present.  Pulmonary/Chest: Effort normal and breath sounds normal. No respiratory distress. Right breast exhibits no inverted nipple, no mass, no  nipple discharge, no skin change and no tenderness. Left breast exhibits no inverted nipple, no mass, no nipple discharge, no skin change and no tenderness. Breasts are symmetrical.  Abdominal: Soft. Normal appearance and bowel sounds are normal. She exhibits no distension, no abdominal bruit, no pulsatile midline mass and no mass. There is no hepatosplenomegaly. There is no tenderness. No hernia.  Musculoskeletal: Normal range of motion. She exhibits no edema.  Lymphadenopathy:       Head (right side): No submandibular adenopathy present.       Head (left side): No submandibular adenopathy present.    She has no cervical adenopathy.    She has no axillary adenopathy.  Neurological: She is alert. She displays no tremor. No cranial nerve deficit. Gait abnormal.  ambualtory with walker (recent femur fracture)  Skin: Skin is warm and dry. No bruising and no ecchymosis noted. No cyanosis. No pallor.  Psychiatric: Her speech is normal and behavior is normal. Thought content normal. Her mood appears not anxious. She does not exhibit a depressed mood.      Assessment & Plan:   Problem List Items Addressed This Visit      Musculoskeletal and Integument   Osteopenia    Order DEXA, check vit D      Relevant Orders   DG Bone Density     Other   Vitamin D deficiency    Check level and replace if needed      Relevant Orders   VITAMIN D 25 Hydroxy (Vit-D Deficiency, Fractures)   Preventative health care - Primary    USPSTF grade A and B recommendations reviewed with patient; age-appropriate recommendations, preventive care, screening tests, etc discussed and encouraged; healthy living encouraged; see AVS  for patient education given to patient       Relevant Orders   CBC with Differential/Platelet   COMPLETE METABOLIC PANEL WITH GFR   Lipid panel   TSH    Other Visit Diagnoses    Need for hepatitis C screening test       Relevant Orders   Hepatitis C Antibody   Screening for breast cancer       Relevant Orders   MM Digital Screening   Urinary incontinence, unspecified type       Relevant Orders   Urinalysis w microscopic + reflex cultur   Ambulatory referral to Urology       Follow up plan: Return in about 1 year (around 12/21/2017) for complete physical; pap smear only in late Nov.  An after-visit summary was printed and given to the patient at [place].  Please see the patient instructions which may contain other information and recommendations beyond what is mentioned above in the assessment and plan.  Meds ordered this encounter  Medications  . HYDROcodone-acetaminophen (NORCO/VICODIN) 5-325 MG tablet    Sig: Take 1 tablet by mouth every 6 (six) hours as needed.     Orders Placed This Encounter  Procedures  . MM Digital Screening  . DG Bone Density  . Hepatitis C Antibody  . CBC with Differential/Platelet  . COMPLETE METABOLIC PANEL WITH GFR  . Lipid panel  . TSH  . VITAMIN D 25 Hydroxy (Vit-D Deficiency, Fractures)  . Urinalysis w microscopic + reflex cultur  . Ambulatory referral to Urology

## 2016-12-21 NOTE — Patient Instructions (Addendum)
Please do call to schedule your mammogram and bone density test; the number to schedule one at either [facility] or [facility] is [phone] We'll get labs today We'll refer you to the urologist about your incontinence If you have not heard anything from my staff in a week about any orders/referrals/studies from today, please contact us here to follow-up [phone] Please do call Dr. Vira Agar about the Protonix Return in late November for a pap smear only Flu shot in the next month or so anywhere you'd like   Health Maintenance, Female Adopting a healthy lifestyle and getting preventive care can go a long way to promote health and wellness. Talk with your health care provider about what schedule of regular examinations is right for you. This is a good chance for you to check in with your provider about disease prevention and staying healthy. In between checkups, there are plenty of things you can do on your own. Experts have done a lot of research about which lifestyle changes and preventive measures are most likely to keep you healthy. Ask your health care provider for more information. Weight and diet Eat a healthy diet  Be sure to include plenty of vegetables, fruits, low-fat dairy products, and lean protein.  Do not eat a lot of foods high in solid fats, added sugars, or salt.  Get regular exercise. This is one of the most important things you can do for your health. ? Most adults should exercise for at least 150 minutes each week. The exercise should increase your heart rate and make you sweat (moderate-intensity exercise). ? Most adults should also do strengthening exercises at least twice a week. This is in addition to the moderate-intensity exercise.  Maintain a healthy weight  Body mass index (BMI) is a measurement that can be used to identify possible weight problems. It estimates body fat based on height and weight. Your health care provider can  help determine your BMI and help you achieve or maintain a healthy weight.  For females 78 years of age and older: ? A BMI below 18.5 is considered underweight. ? A BMI of 18.5 to 24.9 is normal. ? A BMI of 25 to 29.9 is considered overweight. ? A BMI of 30 and above is considered obese.  Watch levels of cholesterol and blood lipids  You should start having your blood tested for lipids and cholesterol at 60 years of age, then have this test every 5 years.  You may need to have your cholesterol levels checked more often if: ? Your lipid or cholesterol levels are high. ? You are older than 60 years of age. ? You are at high risk for heart disease.  Cancer screening Lung Cancer  Lung cancer screening is recommended for adults 44-66 years old who are at high risk for lung cancer because of a history of smoking.  A yearly low-dose CT scan of the lungs is recommended for people who: ? Currently smoke. ? Have quit within the past 15 years. ? Have at least a 30-pack-year history of smoking. A pack year is smoking an average of one pack of cigarettes a day for 1 year.  Yearly screening should continue until it has been 15 years since you quit.  Yearly screening should stop if you develop a health problem that would prevent you from having lung cancer treatment.  Breast Cancer  Practice breast self-awareness. This means understanding how your breasts normally appear and feel.  It also means  doing regular breast self-exams. Let your health care provider know about any changes, no matter how small.  If you are in your 20s or 30s, you should have a clinical breast exam (CBE) by a health care provider every 1-3 years as part of a regular health exam.  If you are 81 or older, have a CBE every year. Also consider having a breast X-ray (mammogram) every year.  If you have a family history of breast cancer, talk to your health care provider about genetic screening.  If you are at high risk  for breast cancer, talk to your health care provider about having an MRI and a mammogram every year.  Breast cancer gene (BRCA) assessment is recommended for women who have family members with BRCA-related cancers. BRCA-related cancers include: ? Breast. ? Ovarian. ? Tubal. ? Peritoneal cancers.  Results of the assessment will determine the need for genetic counseling and BRCA1 and BRCA2 testing.  Cervical Cancer Your health care provider may recommend that you be screened regularly for cancer of the pelvic organs (ovaries, uterus, and vagina). This screening involves a pelvic examination, including checking for microscopic changes to the surface of your cervix (Pap test). You may be encouraged to have this screening done every 3 years, beginning at age 78.  For women ages 8-65, health care providers may recommend pelvic exams and Pap testing every 3 years, or they may recommend the Pap and pelvic exam, combined with testing for human papilloma virus (HPV), every 5 years. Some types of HPV increase your risk of cervical cancer. Testing for HPV may also be done on women of any age with unclear Pap test results.  Other health care providers may not recommend any screening for nonpregnant women who are considered low risk for pelvic cancer and who do not have symptoms. Ask your health care provider if a screening pelvic exam is right for you.  If you have had past treatment for cervical cancer or a condition that could lead to cancer, you need Pap tests and screening for cancer for at least 20 years after your treatment. If Pap tests have been discontinued, your risk factors (such as having a new sexual partner) need to be reassessed to determine if screening should resume. Some women have medical problems that increase the chance of getting cervical cancer. In these cases, your health care provider may recommend more frequent screening and Pap tests.  Colorectal Cancer  This type of cancer can be  detected and often prevented.  Routine colorectal cancer screening usually begins at 60 years of age and continues through 60 years of age.  Your health care provider may recommend screening at an earlier age if you have risk factors for colon cancer.  Your health care provider may also recommend using home test kits to check for hidden blood in the stool.  A small camera at the end of a tube can be used to examine your colon directly (sigmoidoscopy or colonoscopy). This is done to check for the earliest forms of colorectal cancer.  Routine screening usually begins at age 63.  Direct examination of the colon should be repeated every 5-10 years through 60 years of age. However, you may need to be screened more often if early forms of precancerous polyps or small growths are found.  Skin Cancer  Check your skin from head to toe regularly.  Tell your health care provider about any new moles or changes in moles, especially if there is a change in a  mole's shape or color.  Also tell your health care provider if you have a mole that is larger than the size of a pencil eraser.  Always use sunscreen. Apply sunscreen liberally and repeatedly throughout the day.  Protect yourself by wearing long sleeves, pants, a wide-brimmed hat, and sunglasses whenever you are outside.  Heart disease, diabetes, and high blood pressure  High blood pressure causes heart disease and increases the risk of stroke. High blood pressure is more likely to develop in: ? People who have blood pressure in the high end of the normal range (130-139/85-89 mm Hg). ? People who are overweight or obese. ? People who are African American.  If you are 21-32 years of age, have your blood pressure checked every 3-5 years. If you are 30 years of age or older, have your blood pressure checked every year. You should have your blood pressure measured twice-once when you are at a hospital or clinic, and once when you are not at a  hospital or clinic. Record the average of the two measurements. To check your blood pressure when you are not at a hospital or clinic, you can use: ? An automated blood pressure machine at a pharmacy. ? A home blood pressure monitor.  If you are between 61 years and 42 years old, ask your health care provider if you should take aspirin to prevent strokes.  Have regular diabetes screenings. This involves taking a blood sample to check your fasting blood sugar level. ? If you are at a normal weight and have a low risk for diabetes, have this test once every three years after 60 years of age. ? If you are overweight and have a high risk for diabetes, consider being tested at a younger age or more often. Preventing infection Hepatitis B  If you have a higher risk for hepatitis B, you should be screened for this virus. You are considered at high risk for hepatitis B if: ? You were born in a country where hepatitis B is common. Ask your health care provider which countries are considered high risk. ? Your parents were born in a high-risk country, and you have not been immunized against hepatitis B (hepatitis B vaccine). ? You have HIV or AIDS. ? You use needles to inject street drugs. ? You live with someone who has hepatitis B. ? You have had sex with someone who has hepatitis B. ? You get hemodialysis treatment. ? You take certain medicines for conditions, including cancer, organ transplantation, and autoimmune conditions.  Hepatitis C  Blood testing is recommended for: ? Everyone born from [birth year] through 1964. ? Anyone with known risk factors for hepatitis C.  Sexually transmitted infections (STIs)  You should be screened for sexually transmitted infections (STIs) including gonorrhea and chlamydia if: ? You are sexually active and are younger than 60 years of age. ? You are older than 60 years of age and your health care provider tells you that you are at risk for this type of  infection. ? Your sexual activity has changed since you were last screened and you are at an increased risk for chlamydia or gonorrhea. Ask your health care provider if you are at risk.  If you do not have HIV, but are at risk, it may be recommended that you take a prescription medicine daily to prevent HIV infection. This is called pre-exposure prophylaxis (PrEP). You are considered at risk if: ? You are sexually active and do not regularly use condoms or  know the HIV status of your partner(s). ? You take drugs by injection. ? You are sexually active with a partner who has HIV.  Talk with your health care provider about whether you are at high risk of being infected with HIV. If you choose to begin PrEP, you should first be tested for HIV. You should then be tested every 3 months for as long as you are taking PrEP. Pregnancy  If you are premenopausal and you may become pregnant, ask your health care provider about preconception counseling.  If you may become pregnant, take 400 to 800 micrograms (mcg) of folic acid every day.  If you want to prevent pregnancy, talk to your health care provider about birth control (contraception). Osteoporosis and menopause  Osteoporosis is a disease in which the bones lose minerals and strength with aging. This can result in serious bone fractures. Your risk for osteoporosis can be identified using a bone density scan.  If you are 55 years of age or older, or if you are at risk for osteoporosis and fractures, ask your health care provider if you should be screened.  Ask your health care provider whether you should take a calcium or vitamin D supplement to lower your risk for osteoporosis.  Menopause may have certain physical symptoms and risks.  Hormone replacement therapy may reduce some of these symptoms and risks. Talk to your health care provider about whether hormone replacement therapy is right for you. Follow these instructions at home:  Schedule  regular health, dental, and eye exams.  Stay current with your immunizations.  Do not use any tobacco products including cigarettes, chewing tobacco, or electronic cigarettes.  If you are pregnant, do not drink alcohol.  If you are breastfeeding, limit how much and how often you drink alcohol.  Limit alcohol intake to no more than 1 drink per day for nonpregnant women. One drink equals 12 ounces of beer, 5 ounces of wine, or 1 ounces of hard liquor.  Do not use street drugs.  Do not share needles.  Ask your health care provider for help if you need support or information about quitting drugs.  Tell your health care provider if you often feel depressed.  Tell your health care provider if you have ever been abused or do not feel safe at home. This information is not intended to replace advice given to you by your health care provider. Make sure you discuss any questions you have with your health care provider. Document Released: 11/07/2010 Document Revised: 09/30/2015 Document Reviewed: 01/26/2015 Elsevier Interactive Patient Education  [employer].

## 2016-12-21 NOTE — Telephone Encounter (Signed)
[name] would like you to know the pt gave a specimen in hat but it fell in the toilet. [name] was able to get her to give a little more urine but she states it might be enough for culture only. She states she will have to see when she draw it up.

## 2016-12-21 NOTE — Assessment & Plan Note (Signed)
USPSTF grade A and B recommendations reviewed with patient; age-appropriate recommendations, preventive care, screening tests, etc discussed and encouraged; healthy living encouraged; see AVS for patient education given to patient  

## 2016-12-22 LAB — COMPLETE METABOLIC PANEL WITH GFR
ALT: 23 U/L (ref 6–29)
AST: 23 U/L (ref 10–35)
Albumin: 4.4 g/dL (ref 3.6–5.1)
Alkaline Phosphatase: 95 U/L (ref 33–130)
BUN: 13 mg/dL (ref 7–25)
CO2: 22 mmol/L (ref 20–32)
Calcium: 9.4 mg/dL (ref 8.6–10.4)
Chloride: 105 mmol/L (ref 98–110)
Creat: 0.52 mg/dL (ref 0.50–1.05)
GFR, Est African American: >89 mL/min (ref >=60)
GFR, Est Non African American: >89 mL/min (ref >=60)
Glucose, Bld: 114 mg/dL — ABNORMAL HIGH (ref 65–99)
Potassium: 4.3 mmol/L (ref 3.5–5.3)
Sodium: 140 mmol/L (ref 135–146)
Total Bilirubin: 0.8 mg/dL (ref 0.2–1.2)
Total Protein: 6.6 g/dL (ref 6.1–8.1)

## 2016-12-22 LAB — LIPID PANEL
Cholesterol: 168 mg/dL (ref <200)
HDL: 40 mg/dL — ABNORMAL LOW (ref >50)
LDL Cholesterol: 82 mg/dL (ref <100)
Total CHOL/HDL Ratio: 4.2 Ratio (ref <5.0)
Triglycerides: 228 mg/dL — ABNORMAL HIGH (ref <150)
VLDL: 46 mg/dL — ABNORMAL HIGH (ref <30)

## 2016-12-22 LAB — HEPATITIS C ANTIBODY: HCV Ab: NONREACTIVE

## 2016-12-22 LAB — VITAMIN D 25 HYDROXY (VIT D DEFICIENCY, FRACTURES): Vit D, 25-Hydroxy: 33 ng/mL (ref 30–100)

## 2016-12-24 LAB — URINE CULTURE

## 2016-12-25 ENCOUNTER — Other Ambulatory Visit: Payer: Self-pay | Admitting: Family Medicine

## 2016-12-25 MED ORDER — NITROFURANTOIN MONOHYD MACRO 100 MG PO CAPS: 100.0000 mg | ORAL_CAPSULE | Freq: Two times a day (BID) | ORAL | 0 refills | Status: AC

## 2017-01-09 NOTE — Progress Notes (Signed)
01/10/2017 3:26 PM   Amy Gregory Amy Gregory 05/23/1955 [id number]  Referring provider: Kerman Passey, MD [address] [place], [place] [address]  Chief Complaint  Patient presents with  . New Patient (Initial Visit)    urinary incontinence referred by Dr. Sherie Don    HPI: Patient is a 61 -year-old Caucasian female who is referred to [place] by, Dr. Baruch Gouty, for urinary incontinence.  Patient states that she has had urinary incontinence for since June.    Patient has incontinence with SUI and urge incontinence.   She is experiencing several times incontinent episodes during the day. She is experiencing 3 to 4 incontinent episodes during the night.  Her incontinence volume is large.   She is wearing Poise # 5, 10 pads/depends daily.    She is having associated urinary frequency x several times a day, urgency is strong, dysuria and nocturia x 3-4.  Her UA was negative.  Her PVR was 54 mL.    She does not have a history of urinary tract infections, STI's or injury to the bladder.  She denies dysuria, gross hematuria, suprapubic pain, back pain, abdominal pain or flank pain.   She has not had any recent fevers, chills, nausea or vomiting.   She does have a history of nephrolithiasis, but she does not have a history of GU surgery or GU trauma.   She is not sexually active.   She is post menopausal.   She denies constipation and/or diarrhea.     Patient underwent a contrast CT on 08/31/2016 and adrenal glands are unremarkable. Kidneys are normal, without renal calculi, focal lesion, or hydronephrosis.  Bladder is unremarkable.  She is drinking 3 to 4 of water daily.   She is drinking two caffeinated beverages daily.  Occasional soda.  She is not drinking alcoholic beverages daily.    Her risk factors for incontinence are obesity, age, caffeine, vaginal atrophy and pelvic surgery.    She is taking antihistamines and opioids.      Reviewed referral notes.    PMH: Past  Medical History:  Diagnosis Date  . Bilateral hearing loss   . Dysphagia   . GERD (gastroesophageal reflux disease)   . Hiatal hernia    small  . History of diverticulitis   . Impaired memory   . Nephrolithiasis   . OA (osteoarthritis)   . Rheumatoid arthritis (HCC)    managed by Dr. Gavin Potters    Surgical History: Past Surgical History:  Procedure Laterality Date  . ABDOMINAL HYSTERECTOMY  2002   for fibroids, non cancerous reasons, ovaries remain  . BREAST BIOPSY Left 2005-ish   benign  . CATARACT EXTRACTION  2014   OU  . COLONOSCOPY  04/26/04  . ESOPHAGEAL DILATION    . ESOPHAGOGASTRODUODENOSCOPY  02/01/05 and 07/13/14  . INTRAMEDULLARY (IM) NAIL INTERTROCHANTERIC Right 10/17/2016   Procedure: INTRAMEDULLARY (IM) NAIL INTERTROCHANTRIC;  Surgeon: Christena Flake, MD;  Location: [facility] ORS;  Service: Orthopedics;  Laterality: Right;  . KNEE SURGERY Right 03/2014   partial medial and lateral meniscectomy and medial condryle debridement  . LEG SURGERY  2013   fractured left leg, toes, hospitalized for 2 weeks  . ORIF TIBIA FRACTURE     remote ORIF proximal left tiba fracture    Home Medications:  Allergies as of 01/10/2017      Reactions   Sulfa Antibiotics    Cefdinir Rash   Oxycodone Rash   PER PATIENT ON 7.25.18  Medication List       Accurate as of 01/10/17  3:26 PM. Always use your most recent med list.          diphenhydramine-acetaminophen 25-500 MG Tabs tablet Commonly known as:  TYLENOL PM Take by mouth.   folic acid 1 MG tablet Commonly known as:  FOLVITE TAKE 1 TABLET BY MOUTH ONCE DAILY   HYDROcodone-acetaminophen 5-325 MG tablet Commonly known as:  NORCO/VICODIN Take 1 tablet by mouth every 6 (six) hours as needed.   methotrexate 2.5 MG tablet Commonly known as:  RHEUMATREX Take 15 mg by mouth once a week. Take 6  2.5mg  pills once a week.   multivitamin capsule Take by mouth.   pantoprazole 40 MG tablet Commonly known as:  PROTONIX TAKE 1  TABLET BY MOUTH ONCE DAILY.   vitamin C 100 MG tablet Take 100 mg by mouth daily.            Discharge Care Instructions        Start     Ordered   01/10/17 0000  Urinalysis, Complete     01/10/17 1435   01/10/17 0000  BLADDER SCAN AMB NON-IMAGING     01/10/17 1442      Allergies:  Allergies  Allergen Reactions  . Sulfa Antibiotics   . Cefdinir Rash  . Oxycodone Rash    PER PATIENT ON 7.25.18    Family History: Family History  Problem Relation Age of Onset  . Heart disease Mother   . Heart attack Mother   . Rheum arthritis Mother   . Stroke Mother   . Arthritis Mother        RA  . Hypertension Mother   . Stroke Father   . Diabetes Father   . Cancer Sister        lymphoma  . Heart attack Maternal Uncle   . Cancer Maternal Grandmother        leukemia  . Arthritis Maternal Grandmother   . Depression Maternal Grandfather   . Stroke Paternal Grandmother   . Cancer Paternal Grandfather   . Arthritis Sister        rheumatitis  . Thyroid disease Sister   . Hypertension Sister   . Cancer Sister        lung  . Depression Sister   . Kidney cancer Neg Hx   . Bladder Cancer Neg Hx     Social History:  reports that she quit smoking about 47 years ago. She has never used smokeless tobacco. She reports that she does not drink alcohol or use drugs.  ROS: UROLOGY Frequent Urination?: Yes Hard to postpone urination?: Yes Burning/pain with urination?: Yes Get up at night to urinate?: Yes Leakage of urine?: Yes Urine stream starts and stops?: No Trouble starting stream?: No Do you have to strain to urinate?: No Blood in urine?: No Urinary tract infection?: No Sexually transmitted disease?: No Injury to kidneys or bladder?: No Painful intercourse?: No Weak stream?: Yes Currently pregnant?: No Vaginal bleeding?: No Last menstrual period?: n  Gastrointestinal Nausea?: No Vomiting?: No Indigestion/heartburn?: No Diarrhea?: No Constipation?:  No  Constitutional Fever: No Night sweats?: No Weight loss?: No Fatigue?: No  Skin Skin rash/lesions?: No Itching?: No  Eyes Blurred vision?: No Double vision?: No  Ears/Nose/Throat Sore throat?: No Sinus problems?: No  Hematologic/Lymphatic Swollen glands?: No Easy bruising?: No  Cardiovascular Leg swelling?: No Chest pain?: No  Respiratory Cough?: No Shortness of breath?: No  Endocrine Excessive thirst?: Yes  Musculoskeletal  Back pain?: No Joint pain?: Yes  Neurological Headaches?: Yes Dizziness?: No  Psychologic Depression?: No Anxiety?: No  Physical Exam: BP (!) 182/79   Pulse 87   Ht 5\' 6"  (1.676 m)   Wt 169 lb 1.6 oz (76.7 kg)   BMI 27.29 kg/m   Constitutional: Well nourished. Alert and oriented, No acute distress. HEENT: [place] AT, moist mucus membranes. Trachea midline, no masses. Cardiovascular: No clubbing, cyanosis, or edema. Respiratory: Normal respiratory effort, no increased work of breathing. GI: Abdomen is soft, non tender, non distended, no abdominal masses. Liver and spleen not palpable.  No hernias appreciated.  Stool sample for occult testing is not indicated.   GU: No CVA tenderness.  No bladder fullness or masses.  Atrophic external genitalia, normal pubic hair distribution, no lesions.  Normal urethral meatus, no lesions, no prolapse, no discharge.   No urethral masses, tenderness and/or tenderness. No bladder fullness, tenderness or masses. Pale vagina mucosa, poor estrogen effect, no discharge, no lesions, good pelvic support, Grade III cystocele is noted.  No rectocele noted.  Cervix and uterus are surgically absent.  No adnexal/parametria masses or tenderness noted.  Anus and perineum are without rashes or lesions.    Skin: No rashes, bruises or suspicious lesions. Lymph: No cervical or inguinal adenopathy. Neurologic: Grossly intact, no focal deficits, moving all 4 extremities. Psychiatric: Normal mood and affect.  Laboratory  Data: Lab Results  Component Value Date   WBC 5.6 12/21/2016   HGB 12.1 12/21/2016   HCT 36.4 12/21/2016   MCV 93.6 12/21/2016   PLT 320 12/21/2016    Lab Results  Component Value Date   CREATININE 0.52 12/21/2016    Lab Results  Component Value Date   TSH 0.67 12/21/2016       Component Value Date/Time   CHOL 168 12/21/2016 1136   CHOL 140 07/13/2015 1035   HDL 40 (L) 12/21/2016 1136   HDL 48 07/13/2015 1035   CHOLHDL 4.2 12/21/2016 1136   VLDL 46 (H) 12/21/2016 1136   LDLCALC 82 12/21/2016 1136   LDLCALC 64 07/13/2015 1035    Lab Results  Component Value Date   AST 23 12/21/2016   Lab Results  Component Value Date   ALT 23 12/21/2016   I have reviewed the labs  Pertinent Imaging: CLINICAL DATA:  61 year old female with history of GERD and hiatal hernia, diverticulitis and renal colic presents with abdominal and chest pain, nausea, vomiting and diarrhea.  EXAM: CT ANGIOGRAPHY CHEST  CT ABDOMEN AND PELVIS WITH CONTRAST  TECHNIQUE: Multidetector CT imaging of the chest was performed using the standard protocol during bolus administration of intravenous contrast. Multiplanar CT image reconstructions and MIPs were obtained to evaluate the vascular anatomy. Multidetector CT imaging of the abdomen and pelvis was performed using the standard protocol during bolus administration of intravenous contrast.  CONTRAST:  100 cc Isovue 370 IV  COMPARISON:  07/15/2014 CT abdomen and pelvis  FINDINGS: CTA CHEST FINDINGS  Cardiovascular: The study is of quality for the evaluation of pulmonary embolism. There are no filling defects in the central, lobar, segmental or subsegmental pulmonary artery branches to suggest acute pulmonary embolism. Great vessels are normal in course and caliber. Normal heart size. No significant pericardial fluid/thickening.  Mediastinum/Nodes: No discrete thyroid nodules. Unremarkable esophagus. No pathologically enlarged  axillary, mediastinal or hilar lymph nodes. No thoracic aortic aneurysm or dissection.  Lungs/Pleura: No pneumothorax. No pleural effusion. Bibasilar dependent atelectasis is noted.  Upper abdomen: Unremarkable.  Musculoskeletal:  No aggressive  appearing focal osseous lesions.  Review of the MIP images confirms the above findings.  CT ABDOMEN and PELVIS FINDINGS  Hepatobiliary: No focal liver abnormality is seen. No gallstones, gallbladder wall thickening, or biliary dilatation.  Pancreas: Unremarkable. No pancreatic ductal dilatation or surrounding inflammatory changes.  Spleen: Normal in size without focal abnormality.  Adrenals/Urinary Tract: Adrenal glands are unremarkable. Kidneys are normal, without renal calculi, focal lesion, or hydronephrosis. Bladder is unremarkable.  Stomach/Bowel: Contrast distended stomach was normal small bowel rotation. Abnormal transmural thickening of mid to distal ileum and from cecum through transverse colon consistent with an ileocolitis. No bowel obstruction or perforation.  Vascular/Lymphatic: No significant vascular findings are present. No enlarged abdominal or pelvic lymph nodes.  Reproductive: Hysterectomy.  No adnexal mass.  Other: No abdominal wall hernia or abnormality. No pneumoperitoneum. Small amount of free fluid in the pelvis.  Musculoskeletal: No acute or significant osseous findings.  Review of the MIP images confirms the above findings.  IMPRESSION: 1. No acute cardiopulmonary disease.  No pulmonary embolus. 2. Inflammatory thickening of mid to distal ileal loops and colon as above described consistent with acute ileocolitis suspicious for inflammatory bowel disease.   Electronically Signed   By: Tollie Eth M.D.   On: 09/01/2016 00:13     I have independently reviewed the films.    Assessment & Plan:    1. Mixed Incontinence  - offered behavioral therapies, bladder training, bladder  control strategies and pelvic floor muscle training  - offered refer to gynecology for a pessary fitting - patient would like a referral  - RTC after pessary fitting  2. Cystocele  - see above  3. Vaginal atrophy  - Patient was given a sample of vaginal estrogen cream (Premarin) and instructed to apply 0.5mg  (pea-sized amount)  just inside the vaginal introitus with a finger-tip every night for two weeks and then Monday, Wednesday and Friday nights.  I explained to the patient that vaginally administered estrogen, which causes only a slight increase in the blood estrogen levels, have fewer contraindications and adverse systemic effects that oral HT.  - RTC in one month for exam    Return in about 1 month (around 02/09/2017) for OAB questionnaire, PVR and exam.  These notes generated with voice recognition software. I apologize for typographical errors.  Michiel Cowboy, PA-C  [facility] [address] [place], [place] [address] [phone]

## 2017-01-10 ENCOUNTER — Encounter: Payer: Self-pay | Admitting: Urology

## 2017-01-10 ENCOUNTER — Ambulatory Visit: Payer: BLUE CROSS/BLUE SHIELD | Admitting: Urology

## 2017-01-10 VITALS — BP 182/79 | HR 87 | Ht 66.0 in | Wt 169.1 lb

## 2017-01-10 DIAGNOSIS — N8111 Cystocele, midline: Secondary | ICD-10-CM | POA: Diagnosis not present

## 2017-01-10 DIAGNOSIS — N3946 Mixed incontinence: Secondary | ICD-10-CM

## 2017-01-10 DIAGNOSIS — N952 Postmenopausal atrophic vaginitis: Secondary | ICD-10-CM | POA: Diagnosis not present

## 2017-01-10 LAB — URINALYSIS, COMPLETE
Bilirubin, UA: NEGATIVE
Glucose, UA: NEGATIVE
Ketones, UA: NEGATIVE
Leukocytes, UA: NEGATIVE
Nitrite, UA: NEGATIVE
Protein, UA: NEGATIVE
Specific Gravity, UA: 1.02 (ref 1.005–1.030)
Urobilinogen, Ur: 0.2 mg/dL (ref 0.2–1.0)
pH, UA: 6.5 (ref 5.0–7.5)

## 2017-01-10 LAB — BLADDER SCAN AMB NON-IMAGING: Scan Result: 54

## 2017-01-10 MED ORDER — ESTROGENS, CONJUGATED 0.625 MG/GM VA CREA: 1.0000 | TOPICAL_CREAM | Freq: Every day | VAGINAL | 12 refills | Status: DC

## 2017-02-06 NOTE — Progress Notes (Deleted)
02/08/2017 10:49 PM   Ivar Drape Joan Flores 1955-10-25 [id number]  Referring provider: Kerman Passey, MD [address] [place], [place] [address]  No chief complaint on file.   HPI: 61 yo WF with mixed incontinence, vaginal atrophy and cystocele who presents today for follow up.  Background history Patient is a 45 -year-old Caucasian female who is referred to [place] by, Dr. Baruch Gouty, for urinary incontinence. Patient states that she has had urinary incontinence for since June.  Patient has incontinence with SUI and urge incontinence.   She is experiencing several times incontinent episodes during the day. She is experiencing 3 to 4 incontinent episodes during the night.  Her incontinence volume is large.   She is wearing Poise # 5, 10 pads/depends daily.  She is having associated urinary frequency x several times a day, urgency is strong, dysuria and nocturia x 3-4.  Her UA was negative.  Her PVR was 54 mL.  She does not have a history of urinary tract infections, STI's or injury to the bladder.  She denies dysuria, gross hematuria, suprapubic pain, back pain, abdominal pain or flank pain.   She has not had any recent fevers, chills, nausea or vomiting.   She does have a history of nephrolithiasis, but she does not have a history of GU surgery or GU trauma.   She is not sexually active.   She is post menopausal.   She denies constipation and/or diarrhea.    Patient underwent a contrast CT on 08/31/2016 and adrenal glands are unremarkable. Kidneys are normal, without renal calculi, focal lesion, or hydronephrosis.  Bladder is unremarkable.  She is drinking 3 to 4 of water daily.   She is drinking two caffeinated beverages daily.  Occasional soda.  She is not drinking alcoholic beverages daily.   Her risk factors for incontinence are obesity, age, caffeine, vaginal atrophy and pelvic surgery.   She is taking antihistamines and opioids.       At her initial visit, she was initiated on  vaginal estrogen cream and referred for a pessary fitting.      PMH: Past Medical History:  Diagnosis Date  . Bilateral hearing loss   . Dysphagia   . GERD (gastroesophageal reflux disease)   . Hiatal hernia    small  . History of diverticulitis   . Impaired memory   . Nephrolithiasis   . OA (osteoarthritis)   . Rheumatoid arthritis (HCC)    managed by Dr. Gavin Potters    Surgical History: Past Surgical History:  Procedure Laterality Date  . ABDOMINAL HYSTERECTOMY  2002   for fibroids, non cancerous reasons, ovaries remain  . BREAST BIOPSY Left 2005-ish   benign  . CATARACT EXTRACTION  2014   OU  . COLONOSCOPY  04/26/04  . ESOPHAGEAL DILATION    . ESOPHAGOGASTRODUODENOSCOPY  02/01/05 and 07/13/14  . INTRAMEDULLARY (IM) NAIL INTERTROCHANTERIC Right 10/17/2016   Procedure: INTRAMEDULLARY (IM) NAIL INTERTROCHANTRIC;  Surgeon: Christena Flake, MD;  Location: [facility] ORS;  Service: Orthopedics;  Laterality: Right;  . KNEE SURGERY Right 03/2014   partial medial and lateral meniscectomy and medial condryle debridement  . LEG SURGERY  2013   fractured left leg, toes, hospitalized for 2 weeks  . ORIF TIBIA FRACTURE     remote ORIF proximal left tiba fracture    Home Medications:  Allergies as of 02/08/2017      Reactions   Sulfa Antibiotics    Cefdinir Rash   Oxycodone Rash  PER PATIENT ON 7.25.18      Medication List       Accurate as of 02/06/17 10:49 PM. Always use your most recent med list.          conjugated estrogens vaginal cream Commonly known as:  PREMARIN Place 1 Applicatorful vaginally daily. Apply 0.5mg  (pea-sized amount)  just inside the vaginal introitus with a finger-tip every night for two weeks and then Monday, Wednesday and Friday nights.   diphenhydramine-acetaminophen 25-500 MG Tabs tablet Commonly known as:  TYLENOL PM Take by mouth.   folic acid 1 MG tablet Commonly known as:  FOLVITE TAKE 1 TABLET BY MOUTH ONCE DAILY   HYDROcodone-acetaminophen  5-325 MG tablet Commonly known as:  NORCO/VICODIN Take 1 tablet by mouth every 6 (six) hours as needed.   methotrexate 2.5 MG tablet Commonly known as:  RHEUMATREX Take 15 mg by mouth once a week. Take 6  2.5mg  pills once a week.   multivitamin capsule Take by mouth.   pantoprazole 40 MG tablet Commonly known as:  PROTONIX TAKE 1 TABLET BY MOUTH ONCE DAILY.   vitamin C 100 MG tablet Take 100 mg by mouth daily.       Allergies:  Allergies  Allergen Reactions  . Sulfa Antibiotics   . Cefdinir Rash  . Oxycodone Rash    PER PATIENT ON 7.25.18    Family History: Family History  Problem Relation Age of Onset  . Heart disease Mother   . Heart attack Mother   . Rheum arthritis Mother   . Stroke Mother   . Arthritis Mother        RA  . Hypertension Mother   . Stroke Father   . Diabetes Father   . Cancer Sister        lymphoma  . Heart attack Maternal Uncle   . Cancer Maternal Grandmother        leukemia  . Arthritis Maternal Grandmother   . Depression Maternal Grandfather   . Stroke Paternal Grandmother   . Cancer Paternal Grandfather   . Arthritis Sister        rheumatitis  . Thyroid disease Sister   . Hypertension Sister   . Cancer Sister        lung  . Depression Sister   . Kidney cancer Neg Hx   . Bladder Cancer Neg Hx     Social History:  reports that she quit smoking about 47 years ago. She has never used smokeless tobacco. She reports that she does not drink alcohol or use drugs.  ROS:                                        Physical Exam: There were no vitals taken for this visit.  Constitutional: Well nourished. Alert and oriented, No acute distress. HEENT: [place] AT, moist mucus membranes. Trachea midline, no masses. Cardiovascular: No clubbing, cyanosis, or edema. Respiratory: Normal respiratory effort, no increased work of breathing. GI: Abdomen is soft, non tender, non distended, no abdominal masses. Liver and spleen  not palpable.  No hernias appreciated.  Stool sample for occult testing is not indicated.   GU: No CVA tenderness.  No bladder fullness or masses.  Atrophic external genitalia, normal pubic hair distribution, no lesions.  Normal urethral meatus, no lesions, no prolapse, no discharge.   No urethral masses, tenderness and/or tenderness. No bladder fullness, tenderness or masses.  Pale vagina mucosa, poor estrogen effect, no discharge, no lesions, good pelvic support, Grade III cystocele is noted.  No rectocele noted.  Cervix and uterus are surgically absent.  No adnexal/parametria masses or tenderness noted.  Anus and perineum are without rashes or lesions.    Skin: No rashes, bruises or suspicious lesions. Lymph: No cervical or inguinal adenopathy. Neurologic: Grossly intact, no focal deficits, moving all 4 extremities. Psychiatric: Normal mood and affect.  Laboratory Data: Lab Results  Component Value Date   WBC 5.6 12/21/2016   HGB 12.1 12/21/2016   HCT 36.4 12/21/2016   MCV 93.6 12/21/2016   PLT 320 12/21/2016    Lab Results  Component Value Date   CREATININE 0.52 12/21/2016    Lab Results  Component Value Date   TSH 0.67 12/21/2016       Component Value Date/Time   CHOL 168 12/21/2016 1136   CHOL 140 07/13/2015 1035   HDL 40 (L) 12/21/2016 1136   HDL 48 07/13/2015 1035   CHOLHDL 4.2 12/21/2016 1136   VLDL 46 (H) 12/21/2016 1136   LDLCALC 82 12/21/2016 1136   LDLCALC 64 07/13/2015 1035    Lab Results  Component Value Date   AST 23 12/21/2016   Lab Results  Component Value Date   ALT 23 12/21/2016   I have reviewed the labs  Pertinent Imaging: ***  I have independently reviewed the films.    Assessment & Plan:    1. Mixed Incontinence  - offered behavioral therapies, bladder training, bladder control strategies and pelvic floor muscle training  - offered refer to gynecology for a pessary fitting - patient would like a referral  - RTC after pessary  fitting  2. Cystocele  - see above  3. Vaginal atrophy  - Patient was given a sample of vaginal estrogen cream (Premarin) and instructed to apply 0.5mg  (pea-sized amount)  just inside the vaginal introitus with a finger-tip every night for two weeks and then Monday, Wednesday and Friday nights.  I explained to the patient that vaginally administered estrogen, which causes only a slight increase in the blood estrogen levels, have fewer contraindications and adverse systemic effects that oral HT.  - RTC in one month for exam    No Follow-up on file.  These notes generated with voice recognition software. I apologize for typographical errors.  Michiel Cowboy, PA-C  [facility] [address] [place], [place] [address] [phone]

## 2017-02-07 ENCOUNTER — Other Ambulatory Visit: Payer: Self-pay | Admitting: Family Medicine

## 2017-02-07 ENCOUNTER — Ambulatory Visit
Admission: RE | Admit: 2017-02-07 | Discharge: 2017-02-07 | Disposition: A | Discharge status: Home / Self Care | Payer: BLUE CROSS/BLUE SHIELD | Source: Ambulatory Visit | Attending: Family Medicine | Admitting: Family Medicine

## 2017-02-07 ENCOUNTER — Encounter: Payer: Self-pay | Admitting: Family Medicine

## 2017-02-07 DIAGNOSIS — M810 Age-related osteoporosis without current pathological fracture: Secondary | ICD-10-CM | POA: Insufficient documentation

## 2017-02-07 DIAGNOSIS — Z1231 Encounter for screening mammogram for malignant neoplasm of breast: Secondary | ICD-10-CM | POA: Insufficient documentation

## 2017-02-07 DIAGNOSIS — Z1239 Encounter for other screening for malignant neoplasm of breast: Secondary | ICD-10-CM

## 2017-02-07 DIAGNOSIS — M8580 Other specified disorders of bone density and structure, unspecified site: Secondary | ICD-10-CM | POA: Diagnosis present

## 2017-02-07 DIAGNOSIS — M8000XA Age-related osteoporosis with current pathological fracture, unspecified site, initial encounter for fracture: Secondary | ICD-10-CM

## 2017-02-07 HISTORY — DX: Age-related osteoporosis without current pathological fracture: M81.0

## 2017-02-07 NOTE — Assessment & Plan Note (Signed)
Femur fracture; refer to specialist for treatment

## 2017-02-07 NOTE — Progress Notes (Signed)
Refer to Dr. Gavin Potters for treatment of osteoporosis

## 2017-02-08 ENCOUNTER — Ambulatory Visit: Payer: BLUE CROSS/BLUE SHIELD | Admitting: Urology

## 2017-02-08 ENCOUNTER — Encounter: Payer: Self-pay | Admitting: Obstetrics and Gynecology

## 2017-02-08 ENCOUNTER — Ambulatory Visit (INDEPENDENT_AMBULATORY_CARE_PROVIDER_SITE_OTHER): Payer: BLUE CROSS/BLUE SHIELD | Admitting: Obstetrics and Gynecology

## 2017-02-08 VITALS — BP 169/77 | HR 69 | Ht 66.0 in | Wt 171.8 lb

## 2017-02-08 DIAGNOSIS — N952 Postmenopausal atrophic vaginitis: Secondary | ICD-10-CM | POA: Diagnosis not present

## 2017-02-08 DIAGNOSIS — Z90711 Acquired absence of uterus with remaining cervical stump: Secondary | ICD-10-CM

## 2017-02-08 DIAGNOSIS — N3942 Incontinence without sensory awareness: Secondary | ICD-10-CM

## 2017-02-08 DIAGNOSIS — Z780 Asymptomatic menopausal state: Secondary | ICD-10-CM | POA: Diagnosis not present

## 2017-02-08 DIAGNOSIS — R351 Nocturia: Secondary | ICD-10-CM

## 2017-02-08 DIAGNOSIS — N8111 Cystocele, midline: Secondary | ICD-10-CM

## 2017-02-08 NOTE — Progress Notes (Signed)
GYN ENCOUNTER NOTE  Subjective:       Amy Gregory is a 61 y.o. G110P3003 female is here for gynecologic evaluation of the following issues:  1. Pessary fitting.   2. Cystocele 3. Urinary incontinence  61 year old para 24 menopausal female, on no HRT therapy, presents in referral from [facility] urologic for pessary fitting due to urinary incontinence and third degree cystocele. Patient is status post [facility] 2002 Patient is status post SVD 2 with largest baby weighing 8 lbs. 9 oz. Patient reports nocturia 4 Patient reports urge Patient reports spontaneous incontinence when standing Patient denies stress urinary incontinence with laughing coughing or sneezing. No history of chronic recurrent UTIs or pyelonephritis Mild pelvic pressure symptoms. No constipation.   Gynecologic History No LMP recorded. Patient has had a hysterectomy. LSH Contraception: post menopausal status  Obstetric History OB History  Gravida Para Term Preterm AB Living  3 3 3     3   SAB TAB Ectopic Multiple Live Births          3    # Outcome Date GA Lbr Len/2nd Weight Sex Delivery Anes PTL Lv  3 Term 1985   8 lb 14.4 oz (4.037 kg) M Vag-Spont   LIV  2 Term 1982   6 lb 1.8 oz (2.771 kg)  Vag-Spont   LIV  1 Term 1980   7 lb 1.9 oz (3.23 kg) F Vag-Spont   LIV      Past Medical History:  Diagnosis Date  . Bilateral hearing loss   . Dysphagia   . GERD (gastroesophageal reflux disease)   . Hiatal hernia    small  . History of diverticulitis   . Impaired memory   . Nephrolithiasis   . OA (osteoarthritis)   . Osteoporosis 02/07/2017  . Rheumatoid arthritis (HCC)    managed by Dr. 04/09/2017    Past Surgical History:  Procedure Laterality Date  . ABDOMINAL HYSTERECTOMY  2002   for fibroids, non cancerous reasons, ovaries remain  . BREAST BIOPSY Left 2005-ish   benign  . CATARACT EXTRACTION  2014   OU  . COLONOSCOPY  04/26/04  . ESOPHAGEAL DILATION    . ESOPHAGOGASTRODUODENOSCOPY  02/01/05 and  07/13/14  . INTRAMEDULLARY (IM) NAIL INTERTROCHANTERIC Right 10/17/2016   Procedure: INTRAMEDULLARY (IM) NAIL INTERTROCHANTRIC;  Surgeon: 12/17/2016, MD;  Location: [facility] ORS;  Service: Orthopedics;  Laterality: Right;  . KNEE SURGERY Right 03/2014   partial medial and lateral meniscectomy and medial condryle debridement  . LEG SURGERY  2013   fractured left leg, toes, hospitalized for 2 weeks  . ORIF TIBIA FRACTURE     remote ORIF proximal left tiba fracture    Current Outpatient Prescriptions on File Prior to Visit  Medication Sig Dispense Refill  . Ascorbic Acid (VITAMIN C) 100 MG tablet Take 100 mg by mouth daily.    2014 conjugated estrogens (PREMARIN) vaginal cream Place 1 Applicatorful vaginally daily. Apply 0.5mg  (pea-sized amount)  just inside the vaginal introitus with a finger-tip every night for two weeks and then Monday, Wednesday and Friday nights. 30 g 12  . diphenhydramine-acetaminophen (TYLENOL PM) 25-500 MG TABS tablet Take by mouth.    . folic acid (FOLVITE) 1 MG tablet TAKE 1 TABLET BY MOUTH ONCE DAILY    . methotrexate (RHEUMATREX) 2.5 MG tablet Take 15 mg by mouth once a week. Take 6  2.5mg  pills once a week.    . Multiple Vitamin (MULTIVITAMIN) capsule Take by mouth.    Monday  pantoprazole (PROTONIX) 40 MG tablet TAKE 1 TABLET BY MOUTH ONCE DAILY.     No current facility-administered medications on file prior to visit.     Allergies  Allergen Reactions  . Sulfa Antibiotics   . Cefdinir Rash  . Oxycodone Rash    PER PATIENT ON 7.25.18    Social History   Social History  . Marital status: Married    Spouse name: N/A  . Number of children: N/A  . Years of education: N/A   Occupational History  . Not on file.   Social History Main Topics  . Smoking status: Former Smoker    Quit date: 05/08/1969  . Smokeless tobacco: Never Used  . Alcohol use No  . Drug use: No  . Sexual activity: Not Currently   Other Topics Concern  . Not on file   Social History  Narrative  . No narrative on file    Family History  Problem Relation Age of Onset  . Heart disease Mother   . Heart attack Mother   . Rheum arthritis Mother   . Stroke Mother   . Arthritis Mother        RA  . Hypertension Mother   . Stroke Father   . Diabetes Father   . Cancer Sister        lymphoma  . Heart attack Maternal Uncle   . Cancer Maternal Grandmother        leukemia  . Arthritis Maternal Grandmother   . Depression Maternal Grandfather   . Stroke Paternal Grandmother   . Cancer Paternal Grandfather   . Arthritis Sister        rheumatitis  . Thyroid disease Sister   . Hypertension Sister   . Cancer Sister        lung  . Depression Sister   . Breast cancer Paternal Aunt 64  . Kidney cancer Neg Hx   . Bladder Cancer Neg Hx     The following portions of the patient's history were reviewed and updated as appropriate: allergies, current medications, past family history, past medical history, past social history, past surgical history and problem list.  Review of Systems Review of Systems - Comprehensive review of systems is negative except for that noted in the history of present illness  Objective:   BP (!) 169/77   Pulse 69   Ht 5\' 6"  (1.676 m)   Wt 171 lb 12.8 oz (77.9 kg)   BMI 27.73 kg/m  CONSTITUTIONAL: Well-developed, well-nourished female in no acute distress. Patient walks with a walker HENT:  Normocephalic, atraumatic.  NECK: Not examined SKIN: Skin is warm and dry. No rash noted. Not diaphoretic. No erythema. No pallor. NEUROLGIC: Alert and oriented to person, place, and time. PSYCHIATRIC: Normal mood and affect. Normal behavior. Normal judgment and thought content. CARDIOVASCULAR:Not Examined RESPIRATORY: Not Examined BREASTS: Not Examined ABDOMEN: Soft, non distended; Non tender.  No Organomegaly. PELVIC:  External Genitalia: Atrophic changes  BUS: Normal  Vagina: Mild to moderate atrophy; second to third-degree cystocele with Valsalva;  no rectocele; cervix prolapses to midway down the vagina  Cervix: Normal; no cervical motion tenderness; no lesions  Uterus: Surgically absent  Adnexa: Normal; nonpalpable nontender  RV: Normal external exam  Bladder: Nontender MUSCULOSKELETAL: Normal range of motion. No tenderness.  No cyanosis, clubbing, or edema.  PROCEDURE: Pessary fitting  #3 ring with diaphragm support pessary is fitted-successful   Assessment:   1. Urinary incontinence without sensory awareness  2. Nocturia  3. Cystocele, midline  4. Status post laparoscopic supracervical hysterectomy  5. Menopause  6. Vaginal atrophy  7. Unstable bladder     Plan:   1. Pessary is fitted successfully today-#3 with ring diaphragm support 2. Return in 1 week for pessary insertion when it arrives 3. Follow-up with [facility] urology as scheduled 4. May want to consider medical management for urge symptomatology-Merbetriq versus Botox injections versus posterior tibial nerve stimulation versus physical therapy  Herold Harms, MD  Note: This dictation was prepared with Dragon dictation along with smaller phrase technology. Any transcriptional errors that result from this process are unintentional.

## 2017-02-08 NOTE — Patient Instructions (Signed)
1. Ring with diaphragm pessary #3 is ordered 2. Return in 1 week for pessary insertion

## 2017-02-13 ENCOUNTER — Other Ambulatory Visit: Payer: Self-pay | Admitting: *Deleted

## 2017-02-13 ENCOUNTER — Inpatient Hospital Stay
Admission: RE | Admit: 2017-02-13 | Discharge: 2017-02-13 | Disposition: A | Discharge status: Home / Self Care | Payer: Self-pay | Source: Ambulatory Visit | Attending: *Deleted | Admitting: *Deleted

## 2017-02-13 ENCOUNTER — Telehealth: Payer: Self-pay

## 2017-02-13 DIAGNOSIS — Z9289 Personal history of other medical treatment: Secondary | ICD-10-CM

## 2017-02-13 NOTE — Progress Notes (Signed)
02/14/2017 2:14 PM   Amy Gregory Amy Gregory July 22, 1955 [id number]  Referring provider: Kerman Passey, MD [address] [place], [place] [address]  Chief Complaint  Patient presents with  . Urinary Incontinence    Mixed incontinence and cystocele F/U 1 month     HPI: 61 yo WF with mixed incontinence, vaginal atrophy and cystocele who presents today for follow up.  Background history Patient is a 73 -year-old Caucasian female who is referred to [place] by, Dr. Baruch Gouty, for urinary incontinence. Patient states that she has had urinary incontinence for since June.  Patient has incontinence with SUI and urge incontinence.   She is experiencing several times incontinent episodes during the day. She is experiencing 3 to 4 incontinent episodes during the night.  Her incontinence volume is large.   She is wearing Poise # 5, 10 pads/depends daily.  She is having associated urinary frequency x several times a day, urgency is strong, dysuria and nocturia x 3-4.  Her UA was negative.  Her PVR was 54 mL.  She does not have a history of urinary tract infections, STI's or injury to the bladder.  She denies dysuria, gross hematuria, suprapubic pain, back pain, abdominal pain or flank pain.   She has not had any recent fevers, chills, nausea or vomiting.   She does have a history of nephrolithiasis, but she does not have a history of GU surgery or GU trauma.   She is not sexually active.   She is post menopausal.   She denies constipation and/or diarrhea.    Patient underwent a contrast CT on 08/31/2016 and adrenal glands are unremarkable. Kidneys are normal, without renal calculi, focal lesion, or hydronephrosis.  Bladder is unremarkable.  She is drinking 3 to 4 of water daily.   She is drinking two caffeinated beverages daily.  Occasional soda.  She is not drinking alcoholic beverages daily.   Her risk factors for incontinence are obesity, age, caffeine, vaginal atrophy and pelvic surgery.   She is  taking antihistamines and opioids.       At her initial visit, she was initiated on vaginal estrogen cream and referred for a pessary fitting.  Today, she is experiencing urgency x 8 or more, frequency x 8 or more, not restricting fluids to avoid visits to the restroom,  is engaging in toilet mapping, incontinence x 8 or more and nocturia x 4-7.  Her PVR was 12 mL.Marland Kitchen   She has not had dysuria, gross hematuria or suprapubic pain.   She has not had fevers, chills, nausea or vomiting.    She has been seen and evaluated for a pessary.  She will be returning to have for pessary placement at the end of the month.    She is using the vaginal estrogen cream three nights weekly.        PMH: Past Medical History:  Diagnosis Date  . Bilateral hearing loss   . Dysphagia   . GERD (gastroesophageal reflux disease)   . Hiatal hernia    small  . History of diverticulitis   . Impaired memory   . Nephrolithiasis   . OA (osteoarthritis)   . Osteoporosis 02/07/2017  . Rheumatoid arthritis (HCC)    managed by Dr. Gavin Potters    Surgical History: Past Surgical History:  Procedure Laterality Date  . ABDOMINAL HYSTERECTOMY  2002   for fibroids, non cancerous reasons, ovaries remain  . BREAST BIOPSY Left 2005-ish   benign  . CATARACT EXTRACTION  2014   OU  . COLONOSCOPY  04/26/04  . ESOPHAGEAL DILATION    . ESOPHAGOGASTRODUODENOSCOPY  02/01/05 and 07/13/14  . INTRAMEDULLARY (IM) NAIL INTERTROCHANTERIC Right 10/17/2016   Procedure: INTRAMEDULLARY (IM) NAIL INTERTROCHANTRIC;  Surgeon: Christena Flake, MD;  Location: [facility] ORS;  Service: Orthopedics;  Laterality: Right;  . KNEE SURGERY Right 03/2014   partial medial and lateral meniscectomy and medial condryle debridement  . LEG SURGERY  2013   fractured left leg, toes, hospitalized for 2 weeks  . ORIF TIBIA FRACTURE     remote ORIF proximal left tiba fracture    Home Medications:  Allergies as of 02/14/2017      Reactions   Sulfa Antibiotics     Cefdinir Rash   Oxycodone Rash   PER PATIENT ON 7.25.18      Medication List       Accurate as of 02/14/17  2:14 PM. Always use your most recent med list.          conjugated estrogens vaginal cream Commonly known as:  PREMARIN Place 1 Applicatorful vaginally daily. Apply 0.5mg  (pea-sized amount)  just inside the vaginal introitus with a finger-tip every night for two weeks and then Monday, Wednesday and Friday nights.   diphenhydramine-acetaminophen 25-500 MG Tabs tablet Commonly known as:  TYLENOL PM Take by mouth.   folic acid 1 MG tablet Commonly known as:  FOLVITE TAKE 1 TABLET BY MOUTH ONCE DAILY   meloxicam 15 MG tablet Commonly known as:  MOBIC Take by mouth.   methotrexate 2.5 MG tablet Commonly known as:  RHEUMATREX Take 15 mg by mouth once a week. Take 6  2.5mg  pills once a week.   mirabegron ER 25 MG Tb24 tablet Commonly known as:  MYRBETRIQ Take 1 tablet (25 mg total) by mouth daily.   multivitamin capsule Take by mouth.   pantoprazole 40 MG tablet Commonly known as:  PROTONIX TAKE 1 TABLET BY MOUTH ONCE DAILY.   vitamin C 100 MG tablet Take 100 mg by mouth daily.       Allergies:  Allergies  Allergen Reactions  . Sulfa Antibiotics   . Cefdinir Rash  . Oxycodone Rash    PER PATIENT ON 7.25.18    Family History: Family History  Problem Relation Age of Onset  . Heart disease Mother   . Heart attack Mother   . Rheum arthritis Mother   . Stroke Mother   . Arthritis Mother        RA  . Hypertension Mother   . Stroke Father   . Diabetes Father   . Cancer Sister        lymphoma  . Heart attack Maternal Uncle   . Cancer Maternal Grandmother        leukemia  . Arthritis Maternal Grandmother   . Depression Maternal Grandfather   . Stroke Paternal Grandmother   . Cancer Paternal Grandfather   . Arthritis Sister        rheumatitis  . Thyroid disease Sister   . Hypertension Sister   . Cancer Sister        lung  . Depression Sister    . Breast cancer Paternal Aunt 67  . Kidney cancer Neg Hx   . Bladder Cancer Neg Hx     Social History:  reports that she quit smoking about 47 years ago. She has never used smokeless tobacco. She reports that she does not drink alcohol or use drugs.  ROS: UROLOGY Frequent Urination?: Yes Hard  to postpone urination?: Yes Burning/pain with urination?: No Get up at night to urinate?: Yes Leakage of urine?: Yes Urine stream starts and stops?: No Trouble starting stream?: No Do you have to strain to urinate?: No Blood in urine?: No Urinary tract infection?: No Sexually transmitted disease?: No Injury to kidneys or bladder?: No Painful intercourse?: No Weak stream?: No Currently pregnant?: No Vaginal bleeding?: No Last menstrual period?: n  Gastrointestinal Nausea?: No Vomiting?: No Indigestion/heartburn?: No Diarrhea?: No Constipation?: No  Constitutional Fever: No Night sweats?: No Weight loss?: No Fatigue?: No  Skin Skin rash/lesions?: No Itching?: No  Eyes Blurred vision?: No Double vision?: No  Ears/Nose/Throat Sore throat?: No Sinus problems?: No  Hematologic/Lymphatic Swollen glands?: No Easy bruising?: No  Cardiovascular Leg swelling?: No Chest pain?: No  Respiratory Cough?: No Shortness of breath?: No  Endocrine Excessive thirst?: No  Musculoskeletal Back pain?: No Joint pain?: No  Neurological Headaches?: No Dizziness?: No  Psychologic Depression?: No Anxiety?: No  Physical Exam: BP (!) 157/86   Pulse 69   Ht 5\' 6"  (1.676 m)   Wt 171 lb 12.8 oz (77.9 kg)   BMI 27.73 kg/m   Constitutional: Well nourished. Alert and oriented, No acute distress. HEENT: [place] AT, moist mucus membranes. Trachea midline, no masses. Cardiovascular: No clubbing, cyanosis, or edema. Respiratory: Normal respiratory effort, no increased work of breathing. Skin: No rashes, bruises or suspicious lesions. Lymph: No cervical or inguinal  adenopathy. Neurologic: Grossly intact, no focal deficits, moving all 4 extremities. Psychiatric: Normal mood and affect.  Laboratory Data: Lab Results  Component Value Date   WBC 5.6 12/21/2016   HGB 12.1 12/21/2016   HCT 36.4 12/21/2016   MCV 93.6 12/21/2016   PLT 320 12/21/2016    Lab Results  Component Value Date   CREATININE 0.52 12/21/2016    Lab Results  Component Value Date   TSH 0.67 12/21/2016       Component Value Date/Time   CHOL 168 12/21/2016 1136   CHOL 140 07/13/2015 1035   HDL 40 (L) 12/21/2016 1136   HDL 48 07/13/2015 1035   CHOLHDL 4.2 12/21/2016 1136   VLDL 46 (H) 12/21/2016 1136   LDLCALC 82 12/21/2016 1136   LDLCALC 64 07/13/2015 1035    Lab Results  Component Value Date   AST 23 12/21/2016   Lab Results  Component Value Date   ALT 23 12/21/2016   I have reviewed the labs  Pertinent Imaging: Results for SCHUYLAR, BEHANNA (MRN [id number]) as of 02/14/2017 13:56  Ref. Range 02/14/2017 13:41  Scan Result Unknown 12    I have independently reviewed the films.    Assessment & Plan:    1. Mixed Incontinence  - offered behavioral therapies, bladder training, bladder control strategies and pelvic floor muscle training  - going for final pessary fitting in a few weeks  - will start patient on Myrbetriq 25 mg daily as she is still having significant urgency and urge incontinence, I have advised the patient of the side effects of Myrbetriq, such as: elevation in BP, urinary retention and/or HA.  - RTC in three weeks for OAB questionnaire and PVR  2. Cystocele  - see above  3. Vaginal atrophy  - Continue the vaginal estrogen cream 3 nights weekly  - Return in 3 months for an exam   Return in about 3 weeks (around 03/07/2017) for PVR and OAB questionnaire.  These notes generated with voice recognition software. I apologize for typographical errors.  Gillis Boardley,  [facility] [address], [address] [place], [place] [address] [phone]

## 2017-02-13 NOTE — Telephone Encounter (Signed)
Lm for pt to contact office to schedule an appt for pessary insertion.

## 2017-02-14 ENCOUNTER — Encounter: Payer: Self-pay | Admitting: Urology

## 2017-02-14 ENCOUNTER — Ambulatory Visit (INDEPENDENT_AMBULATORY_CARE_PROVIDER_SITE_OTHER): Payer: BLUE CROSS/BLUE SHIELD | Admitting: Urology

## 2017-02-14 VITALS — BP 157/86 | HR 69 | Ht 66.0 in | Wt 171.8 lb

## 2017-02-14 DIAGNOSIS — N8111 Cystocele, midline: Secondary | ICD-10-CM

## 2017-02-14 DIAGNOSIS — N3946 Mixed incontinence: Secondary | ICD-10-CM

## 2017-02-14 DIAGNOSIS — N952 Postmenopausal atrophic vaginitis: Secondary | ICD-10-CM

## 2017-02-14 LAB — BLADDER SCAN AMB NON-IMAGING: Scan Result: 12

## 2017-02-14 MED ORDER — MIRABEGRON ER 25 MG PO TB24: 25.0000 mg | ORAL_TABLET | Freq: Every day | ORAL | 0 refills | Status: DC

## 2017-02-15 ENCOUNTER — Encounter: Payer: BLUE CROSS/BLUE SHIELD | Admitting: Obstetrics and Gynecology

## 2017-02-20 ENCOUNTER — Encounter: Payer: Self-pay | Admitting: Obstetrics and Gynecology

## 2017-02-20 ENCOUNTER — Ambulatory Visit (INDEPENDENT_AMBULATORY_CARE_PROVIDER_SITE_OTHER): Payer: BLUE CROSS/BLUE SHIELD | Admitting: Obstetrics and Gynecology

## 2017-02-20 VITALS — BP 159/71 | HR 73 | Ht 66.0 in | Wt 173.2 lb

## 2017-02-20 DIAGNOSIS — N8111 Cystocele, midline: Secondary | ICD-10-CM | POA: Diagnosis not present

## 2017-02-20 DIAGNOSIS — N952 Postmenopausal atrophic vaginitis: Secondary | ICD-10-CM

## 2017-02-20 DIAGNOSIS — N3942 Incontinence without sensory awareness: Secondary | ICD-10-CM

## 2017-02-20 NOTE — Progress Notes (Signed)
Chief complaint: 1. Midline cystocele 2.Urinary incontinence without sensory awareness 3. Vaginal rophy 4. Pessary insertion  Patient presents for #3 ring with diaphragm support pessary insertion. She is instructed on use of Trimosan gel intravaginal once a week. She will return in 2 weeks for follow-up. At that time she may consider doing self maintenance for her pessary.She will continue to monitor symptomatology regarding urinary incontinence and complete bladder emptying. She has been started on Merbetriq for unstable bladder by urology.  OBJECTIVE: BP (!) 159/71   Pulse 73   Ht 5\' 6"  (1.676 m)   Wt 173 lb 3.2 oz (78.6 kg)   BMI 27.96 kg/m  Pleasant female in no acute distress ABDOMEN: Soft, non distended; Non tender.  No Organomegaly. PELVIC:             External Genitalia: Atrophic changes             BUS: Normal             Vagina: Mild to moderate atrophy; second to third-degree cystocele with Valsalva; no rectocele; cervix prolapses to midway down the vagina             Cervix: Normal; no cervical motion tenderness; no lesions             Uterus: Surgically absent             Adnexa: Normal; nonpalpable nontender             RV: Normal external exam             Bladder: Nontender   PROCEDURE: #3 ring with diaphragm support pessary is inserted  ASSESSMENT: 1. Midline cystocele 2. Urinary incontinencethat sensory awareness  PLAN: 1. Pessary is inserted 2. Trimosan gel intravaginal weekly is recommended 3. Return in 2 weeks for pessary maintenance  , MD  Note: This dictation was prepared with Dragon dictation along with smaller phrase technology. Any transcriptional errors that result from this process are unintentional.

## 2017-02-20 NOTE — Patient Instructions (Signed)
1. #3 ring with support diaphragm pessary is inserted today  2.Return in 2 weeks for follow-up  3. Insert Trimosan gel intravaginal weekly

## 2017-02-21 ENCOUNTER — Encounter: Payer: BLUE CROSS/BLUE SHIELD | Admitting: Obstetrics and Gynecology

## 2017-02-22 ENCOUNTER — Encounter: Payer: Self-pay | Admitting: Family Medicine

## 2017-02-22 DIAGNOSIS — M21372 Foot drop, left foot: Secondary | ICD-10-CM | POA: Insufficient documentation

## 2017-02-22 HISTORY — DX: Foot drop, left foot: M21.372

## 2017-03-06 ENCOUNTER — Ambulatory Visit (INDEPENDENT_AMBULATORY_CARE_PROVIDER_SITE_OTHER): Payer: BLUE CROSS/BLUE SHIELD | Admitting: Obstetrics and Gynecology

## 2017-03-06 ENCOUNTER — Encounter: Payer: Self-pay | Admitting: Obstetrics and Gynecology

## 2017-03-06 VITALS — BP 165/76 | HR 79 | Ht 66.0 in | Wt 174.2 lb

## 2017-03-06 DIAGNOSIS — N3942 Incontinence without sensory awareness: Secondary | ICD-10-CM

## 2017-03-06 DIAGNOSIS — R350 Frequency of micturition: Secondary | ICD-10-CM

## 2017-03-06 DIAGNOSIS — N8111 Cystocele, midline: Secondary | ICD-10-CM

## 2017-03-06 NOTE — Progress Notes (Signed)
Chief complaint: 1.  Pessary maintenance-2 weeks 2.  Urinary incontinence 3.  History of increased urinary frequency  Patient presents for 2-week pessary check following insertion of ring with diaphragm support pessary.  She has noted improvement in bladder function with more complete bladder emptying.  She is not having nocturia but once at this time.  Sometimes during the day she does still develop urgency symptoms.  She has noticed some left lower quadrant discomfort that tends to come and go.  Past medical history, past surgical history, problem list, medications, and allergies are reviewed  OBJECTIVE: BP (!) 165/76   Pulse 79   Ht 5\' 6"  (1.676 m)   Wt 174 lb 3.2 oz (79 kg)   BMI 28.12 kg/m  Pleasant well-appearing female in no acute distress.  Alert and oriented. PELVIC: External Genitalia: Atrophic changes BUS: Normal Vagina: Mild to moderate atrophy; second to third-degree cystocele with Valsalva; no rectocele; cervix prolapses to midway down the vagina; no discharge; no ulcerations Cervix: Normal; no cervical motion tenderness; no lesions Uterus: Surgically absent Adnexa: Normal; nonpalpable nontender RV: Normal external exam Bladder: Nontender  PROCEDURE: The #3 ring with diaphragm support pessary is removed, cleaned, and reinserted   ASSESSMENT: 1.  2-week pessary maintenance-normal 2.  Urinary incontinence, lessened symptomatology with pessary insertion 3.  History of increased urinary frequency 4.  Cystocele, second to third-degree  PLAN: 1.  Pessary maintenance as noted 2.  Continue using Trimosan gel intravaginal weekly 3.  Patient is to practice removing and inserting pessary herself.  Should she have problems she will contact our office. 4.  Return in 4 weeks for follow-up  A total of 15 minutes were spent face-to-face with the patient during this encounter and  over half of that time dealt with counseling and coordination of care.  , MD  Note: This dictation was prepared with Dragon dictation along with smaller phrase technology. Any transcriptional errors that result from this process are unintentional.

## 2017-03-06 NOTE — Patient Instructions (Signed)
1.  Return in 4 weeks for pessary maintenance

## 2017-03-06 NOTE — Progress Notes (Signed)
03/07/2017 2:26 PM   Amy Gregory Amy Gregory 1955/10/18 [id number]  Referring provider: Kerman Passey, MD [address] [place], [place] [address]  Chief Complaint  Patient presents with  . Urinary Incontinence    HPI: 61 yo WF with mixed incontinence, vaginal atrophy and cystocele who presents today for a 3 week follow up after a trial of Myrbetriq.    Background history Patient is a 85 -year-old Caucasian female who is referred to [place] by, Dr. Baruch Gouty, for urinary incontinence. Patient states that she has had urinary incontinence for since June.  Patient has incontinence with SUI and urge incontinence.   She is experiencing several times incontinent episodes during the day. She is experiencing 3 to 4 incontinent episodes during the night.  Her incontinence volume is large.   She is wearing Poise # 5, 10 pads/depends daily.  She is having associated urinary frequency x several times a day, urgency is strong, dysuria and nocturia x 3-4.  Her UA was negative.  Her PVR was 54 mL.  She does not have a history of urinary tract infections, STI's or injury to the bladder.  She denies dysuria, gross hematuria, suprapubic pain, back pain, abdominal pain or flank pain.   She has not had any recent fevers, chills, nausea or vomiting.   She does have a history of nephrolithiasis, but she does not have a history of GU surgery or GU trauma.   She is not sexually active.   She is post menopausal.   She denies constipation and/or diarrhea.    Patient underwent a contrast CT on 08/31/2016 and adrenal glands are unremarkable. Kidneys are normal, without renal calculi, focal lesion, or hydronephrosis.  Bladder is unremarkable.  She is drinking 3 to 4 of water daily.   She is drinking two caffeinated beverages daily.  Occasional soda.  She is not drinking alcoholic beverages daily.   Her risk factors for incontinence are obesity, age, caffeine, vaginal atrophy and pelvic surgery.   She is taking  antihistamines and opioids.       At her initial visit, she was initiated on vaginal estrogen cream and referred for a pessary fitting.  At her visit three weeks ago, she was experiencing urgency x 8 or more, frequency x 8 or more, not restricting fluids to avoid visits to the restroom,  is engaging in toilet mapping, incontinence x 8 or more and nocturia x 4-7.  Her PVR was 12 mL.Marland Kitchen   She has not had dysuria, gross hematuria or suprapubic pain.   She has not had fevers, chills, nausea or vomiting.    She has been seen and evaluated for a pessary.  She will be returning to have for pessary placement at the end of the month.    She is using the vaginal estrogen cream three nights weekly.    Today, she is experiencing urgency x 4-7 (improved), frequency x 4-7 (improved), not restricting fluids to avoid visits to the restroom, is engaging in toilet mapping, incontinence x 0-3 (improved) and nocturia x 0-3 (improved).  Her PVR is 12 mL.  Her blood pressure is 153/72.  She is not having dysuria, gross hematuria or suprapubic pain. She is not having fevers, chills, nausea or vomiting.  She states the Myrbetriq has helped greatly.      PMH: Past Medical History:  Diagnosis Date  . Acquired left foot drop 02/22/2017  . Bilateral hearing loss   . Dysphagia   . GERD (gastroesophageal  reflux disease)   . Hiatal hernia    small  . History of diverticulitis   . Impaired memory   . Nephrolithiasis   . OA (osteoarthritis)   . Osteoporosis 02/07/2017  . Rheumatoid arthritis (HCC)    managed by Dr. Gavin Potters    Surgical History: Past Surgical History:  Procedure Laterality Date  . ABDOMINAL HYSTERECTOMY  2002   for fibroids, non cancerous reasons, ovaries remain  . BREAST BIOPSY Left 2005-ish   benign  . CATARACT EXTRACTION  2014   OU  . COLONOSCOPY  04/26/04  . ESOPHAGEAL DILATION    . ESOPHAGOGASTRODUODENOSCOPY  02/01/05 and 07/13/14  . INTRAMEDULLARY (IM) NAIL INTERTROCHANTERIC Right 10/17/2016     Procedure: INTRAMEDULLARY (IM) NAIL INTERTROCHANTRIC;  Surgeon: Christena Flake, MD;  Location: [facility] ORS;  Service: Orthopedics;  Laterality: Right;  . KNEE SURGERY Right 03/2014   partial medial and lateral meniscectomy and medial condryle debridement  . LEG SURGERY  2013   fractured left leg, toes, hospitalized for 2 weeks  . ORIF TIBIA FRACTURE     remote ORIF proximal left tiba fracture    Home Medications:  Allergies as of 03/07/2017      Reactions   Sulfa Antibiotics    Cefdinir Rash   Oxycodone Rash   PER PATIENT ON 7.25.18      Medication List       Accurate as of 03/07/17  2:26 PM. Always use your most recent med list.          conjugated estrogens vaginal cream Commonly known as:  PREMARIN Place 1 Applicatorful vaginally daily. Apply 0.5mg  (pea-sized amount)  just inside the vaginal introitus with a finger-tip every night for two weeks and then Monday, Wednesday and Friday nights.   diphenhydramine-acetaminophen 25-500 MG Tabs tablet Commonly known as:  TYLENOL PM Take by mouth.   folic acid 1 MG tablet Commonly known as:  FOLVITE TAKE 1 TABLET BY MOUTH ONCE DAILY   methotrexate 2.5 MG tablet Commonly known as:  RHEUMATREX Take 15 mg by mouth once a week. Take 6  2.5mg  pills once a week.   mirabegron ER 25 MG Tb24 tablet Commonly known as:  MYRBETRIQ Take 1 tablet (25 mg total) by mouth daily.   multivitamin capsule Take by mouth.   pantoprazole 40 MG tablet Commonly known as:  PROTONIX TAKE 1 TABLET BY MOUTH ONCE DAILY.   vitamin C 100 MG tablet Take 100 mg by mouth daily.       Allergies:  Allergies  Allergen Reactions  . Sulfa Antibiotics   . Cefdinir Rash  . Oxycodone Rash    PER PATIENT ON 7.25.18    Family History: Family History  Problem Relation Age of Onset  . Heart disease Mother   . Heart attack Mother   . Rheum arthritis Mother   . Stroke Mother   . Arthritis Mother        RA  . Hypertension Mother   . Stroke Father    . Diabetes Father   . Cancer Sister        lymphoma  . Heart attack Maternal Uncle   . Cancer Maternal Grandmother        leukemia  . Arthritis Maternal Grandmother   . Depression Maternal Grandfather   . Stroke Paternal Grandmother   . Cancer Paternal Grandfather   . Arthritis Sister        rheumatitis  . Thyroid disease Sister   . Hypertension Sister   .  Cancer Sister        lung  . Depression Sister   . Breast cancer Paternal Aunt 70  . Kidney cancer Neg Hx   . Bladder Cancer Neg Hx     Social History:  reports that she quit smoking about 47 years ago. She has never used smokeless tobacco. She reports that she does not drink alcohol or use drugs.  ROS: UROLOGY Frequent Urination?: No Hard to postpone urination?: No Burning/pain with urination?: No Get up at night to urinate?: No Leakage of urine?: No Urine stream starts and stops?: No Trouble starting stream?: No Do you have to strain to urinate?: No Blood in urine?: No Urinary tract infection?: No Sexually transmitted disease?: No Injury to kidneys or bladder?: No Painful intercourse?: No Weak stream?: No Currently pregnant?: No Vaginal bleeding?: No Last menstrual period?: n  Gastrointestinal Nausea?: No Vomiting?: No Indigestion/heartburn?: No Diarrhea?: No Constipation?: No  Constitutional Fever: No Night sweats?: No Weight loss?: No Fatigue?: No  Skin Skin rash/lesions?: No Itching?: No  Eyes Blurred vision?: No Double vision?: No  Ears/Nose/Throat Sore throat?: No Sinus problems?: No  Hematologic/Lymphatic Swollen glands?: No Easy bruising?: No  Cardiovascular Leg swelling?: No Chest pain?: No  Respiratory Cough?: No Shortness of breath?: No  Endocrine Excessive thirst?: No  Musculoskeletal Back pain?: No Joint pain?: No  Neurological Headaches?: No Dizziness?: No  Psychologic Depression?: No Anxiety?: No  Physical Exam: BP (!) 153/72 (BP Location: Right Arm,  Patient Position: Sitting, Cuff Size: Normal)   Pulse 69   Ht 5\' 6"  (1.676 m)   Wt 170 lb 4.8 oz (77.2 kg)   BMI 27.49 kg/m   Constitutional: Well nourished. Alert and oriented, No acute distress. HEENT: [place] AT, moist mucus membranes. Trachea midline, no masses. Cardiovascular: No clubbing, cyanosis, or edema. Respiratory: Normal respiratory effort, no increased work of breathing. Skin: No rashes, bruises or suspicious lesions. Lymph: No cervical or inguinal adenopathy. Neurologic: Grossly intact, no focal deficits, moving all 4 extremities. Psychiatric: Normal mood and affect.  Laboratory Data: Lab Results  Component Value Date   WBC 5.6 12/21/2016   HGB 12.1 12/21/2016   HCT 36.4 12/21/2016   MCV 93.6 12/21/2016   PLT 320 12/21/2016    Lab Results  Component Value Date   CREATININE 0.52 12/21/2016    Lab Results  Component Value Date   TSH 0.67 12/21/2016       Component Value Date/Time   CHOL 168 12/21/2016 1136   CHOL 140 07/13/2015 1035   HDL 40 (L) 12/21/2016 1136   HDL 48 07/13/2015 1035   CHOLHDL 4.2 12/21/2016 1136   VLDL 46 (H) 12/21/2016 1136   LDLCALC 82 12/21/2016 1136   LDLCALC 64 07/13/2015 1035    Lab Results  Component Value Date   AST 23 12/21/2016   Lab Results  Component Value Date   ALT 23 12/21/2016   I have reviewed the labs  Pertinent Imaging: Results for ARDIE, DRAGOO (MRN Andi Devon) as of 03/07/2017 14:25  Ref. Range 03/07/2017 14:15  Scan Result Unknown 12     I have independently reviewed the films.    Assessment & Plan:    1. Mixed Incontinence  - pessary and Myrbetriq are working well together  - continue the Myrbetriq; prescription sent to pharmacy  - RTC in 3 months for PVR and OAB questionnaire   2. Cystocele  - see above  3. Vaginal atrophy  - Continue the vaginal estrogen cream 3 nights weekly  -  Return in 3 months for an exam   Return in about 3 months (around 06/07/2017) for OAB questionnaire, PVR  and exam.  These notes generated with voice recognition software. I apologize for typographical errors.  Michiel CowboySHANNON Marlowe Lawes, PA-C  [facility] [address], [address] [place], [place] [address] [phone]

## 2017-03-07 ENCOUNTER — Encounter: Payer: Self-pay | Admitting: Urology

## 2017-03-07 ENCOUNTER — Ambulatory Visit (INDEPENDENT_AMBULATORY_CARE_PROVIDER_SITE_OTHER): Payer: BLUE CROSS/BLUE SHIELD | Admitting: Urology

## 2017-03-07 VITALS — BP 153/72 | HR 69 | Ht 66.0 in | Wt 170.3 lb

## 2017-03-07 DIAGNOSIS — N3946 Mixed incontinence: Secondary | ICD-10-CM | POA: Diagnosis not present

## 2017-03-07 DIAGNOSIS — N8111 Cystocele, midline: Secondary | ICD-10-CM

## 2017-03-07 DIAGNOSIS — N952 Postmenopausal atrophic vaginitis: Secondary | ICD-10-CM | POA: Diagnosis not present

## 2017-03-07 LAB — BLADDER SCAN AMB NON-IMAGING: Scan Result: 12

## 2017-03-07 MED ORDER — MIRABEGRON ER 25 MG PO TB24: 25.0000 mg | ORAL_TABLET | Freq: Every day | ORAL | 11 refills | Status: DC

## 2017-03-28 ENCOUNTER — Telehealth: Payer: Self-pay

## 2017-03-28 MED ORDER — TROSPIUM CHLORIDE ER 60 MG PO CP24: 60.0000 mg | ORAL_CAPSULE | Freq: Every day | ORAL | 0 refills | Status: DC

## 2017-03-28 NOTE — Telephone Encounter (Signed)
Let's prescribed trospium 60 mg XL daily, # 30.  She will need to come in one month for an OAB questionnaire and PVR.

## 2017-03-28 NOTE — Telephone Encounter (Signed)
Pt insurance rejected myrbetriq. Insurance company wants pt to try oxybutynin, tolterodine, or trospium. Please advise.

## 2017-03-28 NOTE — Telephone Encounter (Signed)
LMOM- medication was sent to pharmacy.  

## 2017-04-02 NOTE — Telephone Encounter (Signed)
LMOM

## 2017-04-03 ENCOUNTER — Encounter: Payer: Self-pay | Admitting: Obstetrics and Gynecology

## 2017-04-03 ENCOUNTER — Ambulatory Visit (INDEPENDENT_AMBULATORY_CARE_PROVIDER_SITE_OTHER): Payer: BLUE CROSS/BLUE SHIELD | Admitting: Obstetrics and Gynecology

## 2017-04-03 ENCOUNTER — Ambulatory Visit: Payer: BLUE CROSS/BLUE SHIELD | Admitting: Family Medicine

## 2017-04-03 VITALS — BP 189/76 | HR 87 | Ht 66.0 in | Wt 174.5 lb

## 2017-04-03 DIAGNOSIS — R350 Frequency of micturition: Secondary | ICD-10-CM | POA: Diagnosis not present

## 2017-04-03 DIAGNOSIS — N8111 Cystocele, midline: Secondary | ICD-10-CM

## 2017-04-03 DIAGNOSIS — B373 Candidiasis of vulva and vagina: Secondary | ICD-10-CM

## 2017-04-03 DIAGNOSIS — Z4689 Encounter for fitting and adjustment of other specified devices: Secondary | ICD-10-CM

## 2017-04-03 DIAGNOSIS — Z90711 Acquired absence of uterus with remaining cervical stump: Secondary | ICD-10-CM | POA: Diagnosis not present

## 2017-04-03 DIAGNOSIS — B3731 Acute candidiasis of vulva and vagina: Secondary | ICD-10-CM

## 2017-04-03 LAB — POCT URINALYSIS DIPSTICK
Bilirubin, UA: NEGATIVE
Blood, UA: NEGATIVE
Glucose, UA: NEGATIVE
Ketones, UA: NEGATIVE
Nitrite, UA: NEGATIVE
Protein, UA: NEGATIVE
Spec Grav, UA: <=1.005 — AB (ref 1.010–1.025)
Urobilinogen, UA: 0.2 E.U./dL
pH, UA: 7.5 (ref 5.0–8.0)

## 2017-04-03 MED ORDER — FLUCONAZOLE 150 MG PO TABS: 150.0000 mg | ORAL_TABLET | ORAL | 0 refills | Status: DC

## 2017-04-03 NOTE — Addendum Note (Signed)
Addended by: Marchelle Folks on: 04/03/2017 02:35 PM   Modules accepted: Orders

## 2017-04-03 NOTE — Telephone Encounter (Signed)
LMOM- will send a letter.  

## 2017-04-03 NOTE — Progress Notes (Signed)
Chief complaint: 1.  Pessary maintenance-2 weeks 2.  Urinary incontinence 3.  History of increased urinary frequency  Patient presents for 4-week pessary check. #3  Ring with diaphragm support pessary Trimosan gel intravaginally weekly. No significant nocturia-once per night.  No urinary incontinence. Patient reports persistent symptoms of increased frequency of urination-7-10 episodes per day.  She denies dysuria. Urge symptoms occasionally occur during the day. Patient is not capable/comfortable with removing and inserting the pessary on her own.  Past medical history, past surgical history, problem list, medications, and allergies are reviewed  OBJECTIVE: BP (!) 189/76   Pulse 87   Ht 5\' 6"  (1.676 m)   Wt 174 lb 8 oz (79.2 kg)   BMI 28.17 kg/m  Pleasant well-appearing female in no acute distress.  Alert and oriented.  Affect is appropriate. Abdomen: Soft, nontender Pelvic exam: External genitalia-atrophic changes present BUS-normal Vagina-mild to moderate atrophy; second or third cystocele with Valsalva; no rectocele; cervix prolapsed to midway down the vagina; minimal thick white discharge is noted.; no ulcerations, but there is slight hyperemia in the posterior vagina inferior to cervix Cervix-no cervical motion tenderness; no lesions Uterus-surgically absent Adnexa-nonpalpable and nontender Rectovaginal-normal external exam Bladder-nontender  PROCEDURE: #3 ring with diaphragm support pessary is removed, cleaned, and reinserted  ASSESSMENT: 1.  4-week pessary maintenance-normal 2.  Urinary incontinence, decreased with pessary use  3.  Second to third-degree cystocele 4.  Monilia vaginitis 5.  Inability to insert and remove pessary; desires physician maintenance  PLAN: 1.  Pessary maintenance as noted 2.  Continue with Trimosan gel intravaginally weekly 3.  Diflucan 150 mg orally every 3 days x2 doses 4.  Return in 8 weeks for follow-up  A total of 15 minutes were  spent face-to-face with the patient during this encounter and over half of that time dealt with counseling and coordination of care.  , MD  Note: This dictation was prepared with Dragon dictation along with smaller phrase technology. Any transcriptional errors that result from this process are unintentional.

## 2017-04-03 NOTE — Patient Instructions (Signed)
1.  Return in 8 weeks for pessary maintenance 2.  Continue with Trimosan gel intravaginally weekly 3.  Diflucan 150 mg orally is to be taken every 3 days x2 doses for yeast infection

## 2017-04-06 LAB — URINE CULTURE

## 2017-04-09 ENCOUNTER — Telehealth: Payer: Self-pay

## 2017-04-09 MED ORDER — CIPROFLOXACIN HCL 500 MG PO TABS: 500.0000 mg | ORAL_TABLET | Freq: Two times a day (BID) | ORAL | 0 refills | Status: DC

## 2017-04-09 NOTE — Telephone Encounter (Signed)
Pt aware pos uti. Med erx.

## 2017-04-09 NOTE — Telephone Encounter (Signed)
-----   Message from Herold Harms, MD sent at 04/08/2017  7:47 PM EST ----- Please notify - Abnormal Labs Please call in Cipro 500 mg twice daily x 7 days

## 2017-05-09 ENCOUNTER — Telehealth: Payer: Self-pay | Admitting: Obstetrics and Gynecology

## 2017-05-09 NOTE — Telephone Encounter (Signed)
Pt is having burning and lower abd pain. She is still leaking urine with the pessary. NO fevers or back pain. Pos for urgency and frequency. NO VB,VD, or VO. KC to r/s her 05/29/17 appt for 05/10/2017 at 1:15.

## 2017-05-09 NOTE — Telephone Encounter (Signed)
The patient called and stated that she would like a call from her nurse, The patient thinks she may have a bladder infection. Pease advise.

## 2017-05-10 ENCOUNTER — Encounter: Payer: Self-pay | Admitting: Obstetrics and Gynecology

## 2017-05-10 ENCOUNTER — Ambulatory Visit: Payer: BLUE CROSS/BLUE SHIELD | Admitting: Obstetrics and Gynecology

## 2017-05-10 VITALS — BP 142/80 | HR 94 | Ht 66.0 in | Wt 175.4 lb

## 2017-05-10 DIAGNOSIS — R350 Frequency of micturition: Secondary | ICD-10-CM | POA: Diagnosis not present

## 2017-05-10 DIAGNOSIS — Z4689 Encounter for fitting and adjustment of other specified devices: Secondary | ICD-10-CM

## 2017-05-10 DIAGNOSIS — N8111 Cystocele, midline: Secondary | ICD-10-CM | POA: Diagnosis not present

## 2017-05-10 LAB — POCT URINALYSIS DIPSTICK
Bilirubin, UA: NEGATIVE
Blood, UA: NEGATIVE
Glucose, UA: NEGATIVE
Ketones, UA: NEGATIVE
Nitrite, UA: NEGATIVE
Odor: NEGATIVE
Protein, UA: NEGATIVE
Spec Grav, UA: 1.01 (ref 1.010–1.025)
Urobilinogen, UA: 0.2 E.U./dL
pH, UA: 7.5 (ref 5.0–8.0)

## 2017-05-10 MED ORDER — NITROFURANTOIN MONOHYD MACRO 100 MG PO CAPS: 100.0000 mg | ORAL_CAPSULE | Freq: Two times a day (BID) | ORAL | 0 refills | Status: DC

## 2017-05-10 NOTE — Patient Instructions (Signed)
1.  Take Macrobid twice a day for 7 days 2.  Urine culture is obtained to rule out UTI 3.  Return in 8 weeks for pessary maintenance

## 2017-05-10 NOTE — Progress Notes (Signed)
Chief complaint: 1.  Urinary frequency 2.  Pessary maintenance  Patient presents 2 weeks early for assessment for possible UTI and pessary maintenance.  Her last visit was 6 weeks ago for management of her pessary. She is complaining of urinary frequency, urgency, and dysuria.  She is not having any fever.  She is not having any flank pain.  She denies any significant vaginal discharge. Bowel function is normal. She denies fevers chills and sweats.  Past medical history, past surgical history, problem list, medications, and allergies are reviewed  OBJECTIVE: BP (!) 142/80   Pulse 94   Ht 5\' 6"  (1.676 m)   Wt 175 lb 6.4 oz (79.6 kg)   BMI 28.31 kg/m  Pleasant well-appearing female in no acute distress.  Patient ambulates with a walker. Back: No CVA tenderness Abdomen: Soft, nontender without organomegaly.  No skin rash Pelvic exam: External genitalia-normal BUS-normal Vagina-moderate atrophy; second to third-degree cystocele with Valsalva; no rectocele; cervix is prolapsed midway down the vagina; minimal secretions are noted; no vaginal ulcerations; no hyperemia Cervix-no cervical motion tenderness; no lesions Uterus-surgically absent Adnexa-nonpalpable nontender Rectovaginal-normal external exam Bladder-tender slightly  PROCEDURE: #3 ring with diaphragm support pessary is removed, cleaned, and reinserted  ASSESSMENT: 1.  UTI symptoms with frequency urgency and dysuria; clinical exam suspicious for cystitis 2.  Cystocele and urinary incontinence, treated with pessary  PLAN: 1.  Urinalysis and urine culture is obtained 2.  Macrobid twice a day for 7 days as ordered 3.  Pessary is removed, cleaned, and reinserted. 4.  Return in 8 weeks for pessary maintenance  A total of 15 minutes were spent face-to-face with the patient during this encounter and over half of that time dealt with counseling and coordination of care.  , MD  Note: This dictation was  prepared with Dragon dictation along with smaller phrase technology. Any transcriptional errors that result from this process are unintentional.

## 2017-05-12 LAB — URINE CULTURE

## 2017-05-17 NOTE — Telephone Encounter (Signed)
Patient states that the trospium was not working well.  She understands that insurance will not pay for Myrbetriq, but it was working best for her.    I will put some samples up front for patient to pick up.

## 2017-05-29 ENCOUNTER — Encounter: Payer: BLUE CROSS/BLUE SHIELD | Admitting: Obstetrics and Gynecology

## 2017-06-02 ENCOUNTER — Other Ambulatory Visit: Payer: Self-pay | Admitting: Urology

## 2017-06-06 ENCOUNTER — Ambulatory Visit: Payer: BLUE CROSS/BLUE SHIELD | Admitting: Podiatry

## 2017-06-17 NOTE — Progress Notes (Signed)
06/18/2017 2:22 PM   Amy Gregory Amy Gregory 06-22-1955 [id number]  Referring provider: Kerman Passey, MD [address] [place], [place] [address]  Chief Complaint  Patient presents with  . Urinary Incontinence    HPI: 62 yo WF with mixed incontinence, vaginal atrophy and cystocele who presents today for a 3 month follow up.     Background history Patient is a 30 -year-old Caucasian female who is referred to [place] by, Dr. Baruch Gouty, for urinary incontinence. Patient states that she has had urinary incontinence for since June.  Patient has incontinence with SUI and urge incontinence.   She is experiencing several times incontinent episodes during the day. She is experiencing 3 to 4 incontinent episodes during the night.  Her incontinence volume is large.   She is wearing Po[place] # 5, 10 pads/depends daily.  She is having associated urinary frequency x several times a day, urgency is strong, dysuria and nocturia x 3-4.  Her UA was negative.  Her PVR was 54 mL.  She does not have a history of urinary tract infections, STI's or injury to the bladder.  She denies dysuria, gross hematuria, suprapubic pain, back pain, abdominal pain or flank pain.   She has not had any recent fevers, chills, nausea or vomiting.   She does have a history of nephrolithiasis, but she does not have a history of GU surgery or GU trauma.   She is not sexually active.   She is post menopausal.   She denies constipation and/or diarrhea.    Patient underwent a contrast CT on 08/31/2016 and adrenal glands are unremarkable. Kidneys are normal, without renal calculi, focal lesion, or hydronephrosis.  Bladder is unremarkable.  She is drinking 3 to 4 of water daily.   She is drinking two caffeinated beverages daily.  Occasional soda.  She is not drinking alcoholic beverages daily.   Her risk factors for incontinence are obesity, age, caffeine, vaginal atrophy and pelvic surgery.   She is taking antihistamines and opioids.        She is using the vaginal estrogen cream three nights weekly.  Pessary in place.    Today, she is experiencing urgency x 4-7 (stable), frequency x 8 or more (worse), not restricting fluids to avoid visits to the restroom, is engaging in toilet mapping, incontinence x 0-3 (stable) and nocturia x 0-3 (stable).  Her PVR is 43 mL.  Her blood pressure is 168/89.  She is not having dysuria, gross hematuria or suprapubic pain. She is not having fevers, chills, nausea or vomiting.   She had to discontinue the Myrbetriq due to cost and has been off the medication for one week.  She is now on tropsium 60 mg daily.    PMH: Past Medical History:  Diagnosis Date  . Acquired left foot drop 02/22/2017  . Bilateral hearing loss   . Dysphagia   . GERD (gastroesophageal reflux disease)   . Hiatal hernia    small  . History of diverticulitis   . Impaired memory   . Nephrolithiasis   . OA (osteoarthritis)   . Osteoporosis 02/07/2017  . Rheumatoid arthritis (HCC)    managed by Dr. Gavin Potters    Surgical History: Past Surgical History:  Procedure Laterality Date  . ABDOMINAL HYSTERECTOMY  2002   for fibroids, non cancerous reasons, ovaries remain  . BREAST BIOPSY Left 2005-ish   benign  . CATARACT EXTRACTION  2014   OU  . COLONOSCOPY  04/26/04  . ESOPHAGEAL  DILATION    . ESOPHAGOGASTRODUODENOSCOPY  02/01/05 and 07/13/14  . INTRAMEDULLARY (IM) NAIL INTERTROCHANTERIC Right 10/17/2016   Procedure: INTRAMEDULLARY (IM) NAIL INTERTROCHANTRIC;  Surgeon: Christena Flake, MD;  Location: [facility] ORS;  Service: Orthopedics;  Laterality: Right;  . KNEE SURGERY Right 03/2014   partial medial and lateral meniscectomy and medial condryle debridement  . LEG SURGERY  2013   fractured left leg, toes, hospitalized for 2 weeks  . ORIF TIBIA FRACTURE     remote ORIF proximal left tiba fracture    Home Medications:  Allergies as of 06/18/2017      Reactions   Sulfa Antibiotics    Cefdinir Rash   Oxycodone Rash   PER  PATIENT ON 7.25.18      Medication List        Accurate as of 06/18/17  2:22 PM. Always use your most recent med list.          conjugated estrogens vaginal cream Commonly known as:  PREMARIN Place 1 Applicatorful vaginally daily. Apply 0.5mg  (pea-sized amount)  just inside the vaginal introitus with a finger-tip every night for two weeks and then Monday, Wednesday and Friday nights.   diphenhydramine-acetaminophen 25-500 MG Tabs tablet Commonly known as:  TYLENOL PM Take by mouth.   folic acid 1 MG tablet Commonly known as:  FOLVITE TAKE 1 TABLET BY MOUTH ONCE DAILY   methotrexate 2.5 MG tablet Commonly known as:  RHEUMATREX Take 15 mg by mouth once a week. Take 6  2.5mg  pills once a week.   multivitamin capsule Take by mouth.   nitrofurantoin (macrocrystal-monohydrate) 100 MG capsule Commonly known as:  MACROBID Take 1 capsule (100 mg total) by mouth 2 (two) times daily.   pantoprazole 40 MG tablet Commonly known as:  PROTONIX TAKE 1 TABLET BY MOUTH ONCE DAILY.   PROLIA Welda Inject into the skin.   TRIMO-SAN 0.025 % Gel Generic drug:  OXYQUINOLONE SULFATE VAGINAL Place vaginally.   Trospium Chloride 60 MG Cp24 Take 1 capsule (60 mg total) by mouth daily.   vitamin C 100 MG tablet Take 100 mg by mouth daily.       Allergies:  Allergies  Allergen Reactions  . Sulfa Antibiotics   . Cefdinir Rash  . Oxycodone Rash    PER PATIENT ON 7.25.18    Family History: Family History  Problem Relation Age of Onset  . Heart disease Mother   . Heart attack Mother   . Rheum arthritis Mother   . Stroke Mother   . Arthritis Mother        RA  . Hypertension Mother   . Stroke Father   . Diabetes Father   . Cancer Sister        lymphoma  . Heart attack Maternal Uncle   . Cancer Maternal Grandmother        leukemia  . Arthritis Maternal Grandmother   . Depression Maternal Grandfather   . Stroke Paternal Grandmother   . Cancer Paternal Grandfather   .  Arthritis Sister        rheumatitis  . Thyroid disease Sister   . Hypertension Sister   . Cancer Sister        lung  . Depression Sister   . Breast cancer Paternal Aunt 7  . Kidney cancer Neg Hx   . Bladder Cancer Neg Hx     Social History:  reports that she quit smoking about 48 years ago. she has never used smokeless tobacco. She reports that  she does not drink alcohol or use drugs.  ROS: UROLOGY Frequent Urination?: Yes Hard to postpone urination?: Yes Burning/pain with urination?: No Get up at night to urinate?: Yes Leakage of urine?: No Urine stream starts and stops?: No Trouble starting stream?: No Do you have to strain to urinate?: No Blood in urine?: No Urinary tract infection?: No Sexually transmitted disease?: No Injury to kidneys or bladder?: No Painful intercourse?: No Weak stream?: No Currently pregnant?: No Vaginal bleeding?: No Last menstrual period?: n  Gastrointestinal Nausea?: No Vomiting?: No Indigestion/heartburn?: Yes Diarrhea?: No Constipation?: No  Constitutional Fever: No Night sweats?: No Weight loss?: No Fatigue?: No  Skin Skin rash/lesions?: No Itching?: No  Eyes Blurred vision?: No Double vision?: No  Ears/Nose/Throat Sore throat?: No Sinus problems?: No  Hematologic/Lymphatic Swollen glands?: No Easy bruising?: No  Cardiovascular Leg swelling?: No Chest pain?: No  Respiratory Cough?: No Shortness of breath?: No  Endocrine Excessive thirst?: No  Musculoskeletal Back pain?: No Joint pain?: No  Neurological Headaches?: No Dizziness?: No  Psychologic Depression?: No Anxiety?: No  Physical Exam: BP (!) 168/89 (BP Location: Right Arm, Patient Position: Sitting, Cuff Size: Normal)   Pulse 74   Ht 5\' 6"  (1.676 m)   Wt 178 lb 14.4 oz (81.1 kg)   BMI 28.88 kg/m   Constitutional: Well nourished. Alert and oriented, No acute distress. HEENT: [place] AT, moist mucus membranes. Trachea midline, no  masses. Cardiovascular: No clubbing, cyanosis, or edema. Respiratory: Normal respiratory effort, no increased work of breathing. Skin: No rashes, bruises or suspicious lesions. Lymph: No cervical or inguinal adenopathy. Neurologic: Grossly intact, no focal deficits, moving all 4 extremities. Psychiatric: Normal mood and affect.  Laboratory Data: Lab Results  Component Value Date   WBC 5.6 12/21/2016   HGB 12.1 12/21/2016   HCT 36.4 12/21/2016   MCV 93.6 12/21/2016   PLT 320 12/21/2016    Lab Results  Component Value Date   CREATININE 0.52 12/21/2016    Lab Results  Component Value Date   TSH 0.67 12/21/2016       Component Value Date/Time   CHOL 168 12/21/2016 1136   CHOL 140 07/13/2015 1035   HDL 40 (L) 12/21/2016 1136   HDL 48 07/13/2015 1035   CHOLHDL 4.2 12/21/2016 1136   VLDL 46 (H) 12/21/2016 1136   LDLCALC 82 12/21/2016 1136   LDLCALC 64 07/13/2015 1035    Lab Results  Component Value Date   AST 23 12/21/2016   Lab Results  Component Value Date   ALT 23 12/21/2016   I have reviewed the labs  Pertinent Imaging: Results for ALLAN, BACIGALUPI (MRN [id number]) as of 06/18/2017 14:16  Ref. Range 06/18/2017 14:05  Scan Result Unknown 43    Assessment & Plan:    1. Mixed Incontinence  - pessary and tropsium are working well together - she does not like having the pessary in place as she cannot figure out how to take it out  - RTC in 12 months for PVR and OAB questionnaire   2. Cystocele  - see above  3. Vaginal atrophy  - Continue the vaginal estrogen cream 3 nights weekly  - Return in 12 months for an exam  4. HTN  - advised the patient of the risk of stroke and advised her to see Dr. Sherie Don as she has had two readings of high BP  - reviewed stroke symptoms as advised patient to seek treatment in the ED if she should experience any symptoms   -  DASH diet given to patient   Return in about 1 year (around 06/18/2018) for OAB questionnaire, PVR and  exam.  These notes generated with voice recognition software. I apologize for typographical errors.  Michiel Cowboy, PA-C  [facility] [address] [place], [place] [address] [phone]

## 2017-06-18 ENCOUNTER — Ambulatory Visit: Payer: BLUE CROSS/BLUE SHIELD | Admitting: Urology

## 2017-06-18 ENCOUNTER — Encounter: Payer: Self-pay | Admitting: Urology

## 2017-06-18 VITALS — BP 168/89 | HR 74 | Ht 66.0 in | Wt 178.9 lb

## 2017-06-18 DIAGNOSIS — N3946 Mixed incontinence: Secondary | ICD-10-CM

## 2017-06-18 DIAGNOSIS — N8111 Cystocele, midline: Secondary | ICD-10-CM

## 2017-06-18 DIAGNOSIS — N952 Postmenopausal atrophic vaginitis: Secondary | ICD-10-CM

## 2017-06-18 DIAGNOSIS — I10 Essential (primary) hypertension: Secondary | ICD-10-CM

## 2017-06-18 LAB — BLADDER SCAN AMB NON-IMAGING: Scan Result: 43

## 2017-06-18 MED ORDER — TROSPIUM CHLORIDE ER 60 MG PO CP24: 1.0000 | ORAL_CAPSULE | Freq: Every day | ORAL | 3 refills | Status: DC

## 2017-06-18 NOTE — Patient Instructions (Addendum)
DASH Eating Plan DASH stands for "Dietary Approaches to Stop Hypertension." The DASH eating plan is a healthy eating plan that has been shown to reduce high blood pressure (hypertension). It may also reduce your risk for type 2 diabetes, heart disease, and stroke. The DASH eating plan may also help with weight loss. What are tips for following this plan? General guidelines  Avoid eating more than 2,300 mg (milligrams) of salt (sodium) a day. If you have hypertension, you may need to reduce your sodium intake to 1,500 mg a day.  Limit alcohol intake to no more than 1 drink a day for nonpregnant women and 2 drinks a day for men. One drink equals 12 oz of beer, 5 oz of wine, or 1 oz of hard liquor.  Work with your health care provider to maintain a healthy body weight or to lose weight. Ask what an ideal weight is for you.  Get at least 30 minutes of exercise that causes your heart to beat faster (aerobic exercise) most days of the week. Activities may include walking, swimming, or biking.  Work with your health care provider or diet and nutrition specialist (dietitian) to adjust your eating plan to your individual calorie needs. Reading food labels  Check food labels for the amount of sodium per serving. Choose foods with less than 5 percent of the Daily Value of sodium. Generally, foods with less than 300 mg of sodium per serving fit into this eating plan.  To find whole grains, look for the word "whole" as the first word in the ingredient list. Shopping  Buy products labeled as "low-sodium" or "no salt added."  Buy fresh foods. Avoid canned foods and premade or frozen meals. Cooking  Avoid adding salt when cooking. Use salt-free seasonings or herbs instead of table salt or sea salt. Check with your health care provider or pharmacist before using salt substitutes.  Do not fry foods. Cook foods using healthy methods such as baking, boiling, grilling, and broiling instead.  Cook with  heart-healthy oils, such as olive, canola, soybean, or sunflower oil. Meal planning   Eat a balanced diet that includes: ? 5 or more servings of fruits and vegetables each day. At each meal, try to fill half of your plate with fruits and vegetables. ? Up to 6-8 servings of whole grains each day. ? Less than 6 oz of lean meat, poultry, or fish each day. A 3-oz serving of meat is about the same size as a deck of cards. One egg equals 1 oz. ? 2 servings of low-fat dairy each day. ? A serving of nuts, seeds, or beans 5 times each week. ? Heart-healthy fats. Healthy fats called Omega-3 fatty acids are found in foods such as flaxseeds and coldwater fish, like sardines, salmon, and mackerel.  Limit how much you eat of the following: ? Canned or prepackaged foods. ? Food that is high in trans fat, such as fried foods. ? Food that is high in saturated fat, such as fatty meat. ? Sweets, desserts, sugary drinks, and other foods with added sugar. ? Full-fat dairy products.  Do not salt foods before eating.  Try to eat at least 2 vegetarian meals each week.  Eat more home-cooked food and less restaurant, buffet, and fast food.  When eating at a restaurant, ask that your food be prepared with less salt or no salt, if possible. What foods are recommended? The items listed may not be a complete list. Talk with your dietitian about what   dietary choices are best for you. Grains Whole-grain or whole-wheat bread. Whole-grain or whole-wheat pasta. Brown rice. Oatmeal. Quinoa. Bulgur. Whole-grain and low-sodium cereals. Pita bread. Low-fat, low-sodium crackers. Whole-wheat flour tortillas. Vegetables Fresh or frozen vegetables (raw, steamed, roasted, or grilled). Low-sodium or reduced-sodium tomato and vegetable juice. Low-sodium or reduced-sodium tomato sauce and tomato paste. Low-sodium or reduced-sodium canned vegetables. Fruits All fresh, dried, or frozen fruit. Canned fruit in natural juice (without  added sugar). Meat and other protein foods Skinless chicken or turkey. Ground chicken or turkey. Pork with fat trimmed off. Fish and seafood. Egg whites. Dried beans, peas, or lentils. Unsalted nuts, nut butters, and seeds. Unsalted canned beans. Lean cuts of beef with fat trimmed off. Low-sodium, lean deli meat. Dairy Low-fat (1%) or fat-free (skim) milk. Fat-free, low-fat, or reduced-fat cheeses. Nonfat, low-sodium ricotta or cottage cheese. Low-fat or nonfat yogurt. Low-fat, low-sodium cheese. Fats and oils Soft margarine without trans fats. Vegetable oil. Low-fat, reduced-fat, or light mayonnaise and salad dressings (reduced-sodium). Canola, safflower, olive, soybean, and sunflower oils. Avocado. Seasoning and other foods Herbs. Spices. Seasoning mixes without salt. Unsalted popcorn and pretzels. Fat-free sweets. What foods are not recommended? The items listed may not be a complete list. Talk with your dietitian about what dietary choices are best for you. Grains Baked goods made with fat, such as croissants, muffins, or some breads. Dry pasta or rice meal packs. Vegetables Creamed or fried vegetables. Vegetables in a cheese sauce. Regular canned vegetables (not low-sodium or reduced-sodium). Regular canned tomato sauce and paste (not low-sodium or reduced-sodium). Regular tomato and vegetable juice (not low-sodium or reduced-sodium). Pickles. Olives. Fruits Canned fruit in a light or heavy syrup. Fried fruit. Fruit in cream or butter sauce. Meat and other protein foods Fatty cuts of meat. Ribs. Fried meat. Bacon. Sausage. Bologna and other processed lunch meats. Salami. Fatback. Hotdogs. Bratwurst. Salted nuts and seeds. Canned beans with added salt. Canned or smoked fish. Whole eggs or egg yolks. Chicken or turkey with skin. Dairy Whole or 2% milk, cream, and half-and-half. Whole or full-fat cream cheese. Whole-fat or sweetened yogurt. Full-fat cheese. Nondairy creamers. Whipped toppings.  Processed cheese and cheese spreads. Fats and oils Butter. Stick margarine. Lard. Shortening. Ghee. Bacon fat. Tropical oils, such as coconut, palm kernel, or palm oil. Seasoning and other foods Salted popcorn and pretzels. Onion salt, garlic salt, seasoned salt, table salt, and sea salt. Worcestershire sauce. Tartar sauce. Barbecue sauce. Teriyaki sauce. Soy sauce, including reduced-sodium. Steak sauce. Canned and packaged gravies. Fish sauce. Oyster sauce. Cocktail sauce. Horseradish that you find on the shelf. Ketchup. Mustard. Meat flavorings and tenderizers. Bouillon cubes. Hot sauce and Tabasco sauce. Premade or packaged marinades. Premade or packaged taco seasonings. Relishes. Regular salad dressings. Where to find more information:  National Heart, Lung, and Blood Institute: www.nhlbi.nih.gov  American Heart Association: www.heart.org Summary  The DASH eating plan is a healthy eating plan that has been shown to reduce high blood pressure (hypertension). It may also reduce your risk for type 2 diabetes, heart disease, and stroke.  With the DASH eating plan, you should limit salt (sodium) intake to 2,300 mg a day. If you have hypertension, you may need to reduce your sodium intake to 1,500 mg a day.  When on the DASH eating plan, aim to eat more fresh fruits and vegetables, whole grains, lean proteins, low-fat dairy, and heart-healthy fats.  Work with your health care provider or diet and nutrition specialist (dietitian) to adjust your eating plan to your individual   calorie needs. This information is not intended to replace advice given to you by your health care provider. Make sure you discuss any questions you have with your health care provider. Document Released: 04/13/2011 Document Revised: 04/17/2016 Document Reviewed: 04/17/2016 Elsevier Interactive Patient Education  2018 Elsevier Inc.  Hypertension Hypertension is another name for high blood pressure. High blood pressure  forces your heart to work harder to pump blood. This can cause problems over time. There are two numbers in a blood pressure reading. There is a top number (systolic) over a bottom number (diastolic). It is best to have a blood pressure below 120/80. Healthy choices can help lower your blood pressure. You may need medicine to help lower your blood pressure if:  Your blood pressure cannot be lowered with healthy choices.  Your blood pressure is higher than 130/80.  Follow these instructions at home: Eating and drinking  If directed, follow the DASH eating plan. This diet includes: ? Filling half of your plate at each meal with fruits and vegetables. ? Filling one quarter of your plate at each meal with whole grains. Whole grains include whole wheat pasta, brown rice, and whole grain bread. ? Eating or drinking low-fat dairy products, such as skim milk or low-fat yogurt. ? Filling one quarter of your plate at each meal with low-fat (lean) proteins. Low-fat proteins include fish, skinless chicken, eggs, beans, and tofu. ? Avoiding fatty meat, cured and processed meat, or chicken with skin. ? Avoiding premade or processed food.  Eat less than 1,500 mg of salt (sodium) a day.  Limit alcohol use to no more than 1 drink a day for nonpregnant women and 2 drinks a day for men. One drink equals 12 oz of beer, 5 oz of wine, or 1 oz of hard liquor. Lifestyle  Work with your doctor to stay at a healthy weight or to lose weight. Ask your doctor what the best weight is for you.  Get at least 30 minutes of exercise that causes your heart to beat faster (aerobic exercise) most days of the week. This may include walking, swimming, or biking.  Get at least 30 minutes of exercise that strengthens your muscles (resistance exercise) at least 3 days a week. This may include lifting weights or pilates.  Do not use any products that contain nicotine or tobacco. This includes cigarettes and e-cigarettes. If you  need help quitting, ask your doctor.  Check your blood pressure at home as told by your doctor.  Keep all follow-up visits as told by your doctor. This is important. Medicines  Take over-the-counter and prescription medicines only as told by your doctor. Follow directions carefully.  Do not skip doses of blood pressure medicine. The medicine does not work as well if you skip doses. Skipping doses also puts you at risk for problems.  Ask your doctor about side effects or reactions to medicines that you should watch for. Contact a doctor if:  You think you are having a reaction to the medicine you are taking.  You have headaches that keep coming back (recurring).  You feel dizzy.  You have swelling in your ankles.  You have trouble with your vision. Get help right away if:  You get a very bad headache.  You start to feel confused.  You feel weak or numb.  You feel faint.  You get very bad pain in your: ? Chest. ? Belly (abdomen).  You throw up (vomit) more than once.  You have trouble breathing.   Summary  Hypertension is another name for high blood pressure.  Making healthy choices can help lower blood pressure. If your blood pressure cannot be controlled with healthy choices, you may need to take medicine. This information is not intended to replace advice given to you by your health care provider. Make sure you discuss any questions you have with your health care provider. Document Released: 10/11/2007 Document Revised: 03/22/2016 Document Reviewed: 03/22/2016 Elsevier Interactive Patient Education  2018 Elsevier Inc.  

## 2017-07-05 ENCOUNTER — Ambulatory Visit: Payer: BLUE CROSS/BLUE SHIELD | Admitting: Obstetrics and Gynecology

## 2017-07-05 ENCOUNTER — Encounter: Payer: BLUE CROSS/BLUE SHIELD | Admitting: Obstetrics and Gynecology

## 2017-07-05 ENCOUNTER — Encounter: Payer: Self-pay | Admitting: Obstetrics and Gynecology

## 2017-07-05 VITALS — BP 179/81 | HR 76 | Ht 66.0 in | Wt 177.2 lb

## 2017-07-05 DIAGNOSIS — Z90711 Acquired absence of uterus with remaining cervical stump: Secondary | ICD-10-CM

## 2017-07-05 DIAGNOSIS — N952 Postmenopausal atrophic vaginitis: Secondary | ICD-10-CM

## 2017-07-05 DIAGNOSIS — Z4689 Encounter for fitting and adjustment of other specified devices: Secondary | ICD-10-CM | POA: Diagnosis not present

## 2017-07-05 DIAGNOSIS — N8111 Cystocele, midline: Secondary | ICD-10-CM

## 2017-07-05 DIAGNOSIS — N3942 Incontinence without sensory awareness: Secondary | ICD-10-CM

## 2017-07-05 NOTE — Patient Instructions (Signed)
1.  Return in 10 weeks for pessary maintenance 2.  Continue using Trimosan gel intravaginal weekly 3.  Continue using Premarin cream intravaginal 1/2 g weekly

## 2017-07-05 NOTE — Progress Notes (Signed)
Chief complaint: 1.  Pessary maintenance 2.  Cystocele 3.  Urinary incontinence  Aren presents today for 8-week follow-up.  She is using #3 ring with diaphragm support pessary for management of cystocele and urinary incontinence. Patient reports excellent voiding function and no leakage. She is interested in resuming intercourse with pessary in place. She denies vaginal bleeding, vaginal discharge, UTI symptoms, or vaginal odor She is placing Premarin cream intravaginally once a week and Trimosan gel intravaginally once a week Status post LSH Needs Pap smear  Past medical history, past surgical history, problem list, medications, and allergies are reviewed  OBJECTIVE: BP (!) 179/81   Pulse 76   Ht 5\' 6"  (1.676 m)   Wt 177 lb 3.2 oz (80.4 kg)   BMI 28.60 kg/m  Pleasant well-appearing female no acute distress.  Patient is ambulating with a walker. Abdomen: Soft, nontender without organomegaly Pelvic exam: External genitalia-normal BUS-normal Vagina-moderate atrophy; second to third-degree cystocele with Valsalva; no rectocele; cervix is prolapsed midway down the vagina; minimal secretions are noted; no vaginal ulcerations; no hyperemia Cervix-no cervical motion tenderness; no lesions Uterus-surgically absent Adnexa-nonpalpable nontender Rectovaginal-normal external exam Bladder-tender slightly  PROCEDURE: Pessary maintenance 3.  Ring with diaphragm support pessary is removed, cleaned, and reinserted  ASSESSMENT: 1.  Cystocele and urinary incontinence, asymptomatic with pessary use 2.  Status post LSH 3.  Needs Pap smear  PLAN: 1.  Pessary maintenance as noted 2.  Return in 10 weeks for pessary maintenance 3.  Continue with Trimosan gel intravaginal weekly 4.  Continue with Premarin cream intravaginal 1/2 g  5.  Pap smear is to be done at next visit  A total of 15 minutes were spent face-to-face with the patient during this encounter and over half of that time dealt  with counseling and coordination of care.  , MD  Note: This dictation was prepared with Dragon dictation along with smaller phrase technology. Any transcriptional errors that result from this process are unintentional.

## 2017-07-09 ENCOUNTER — Encounter: Payer: Self-pay | Admitting: Family Medicine

## 2017-07-09 ENCOUNTER — Ambulatory Visit: Payer: BLUE CROSS/BLUE SHIELD | Admitting: Family Medicine

## 2017-07-09 VITALS — BP 154/76 | HR 96 | Temp 97.8°F | Resp 16 | Ht 66.0 in | Wt 179.6 lb

## 2017-07-09 DIAGNOSIS — R1319 Other dysphagia: Secondary | ICD-10-CM

## 2017-07-09 DIAGNOSIS — I10 Essential (primary) hypertension: Secondary | ICD-10-CM | POA: Diagnosis not present

## 2017-07-09 DIAGNOSIS — R002 Palpitations: Secondary | ICD-10-CM

## 2017-07-09 DIAGNOSIS — R7309 Other abnormal glucose: Secondary | ICD-10-CM | POA: Insufficient documentation

## 2017-07-09 DIAGNOSIS — R1310 Dysphagia, unspecified: Secondary | ICD-10-CM

## 2017-07-09 DIAGNOSIS — R0981 Nasal congestion: Secondary | ICD-10-CM

## 2017-07-09 MED ORDER — FLUTICASONE PROPIONATE 50 MCG/ACT NA SUSP: 2.0000 | Freq: Every day | NASAL | 11 refills | Status: DC

## 2017-07-09 MED ORDER — AMLODIPINE BESYLATE 5 MG PO TABS: 5.0000 mg | ORAL_TABLET | Freq: Every day | ORAL | 3 refills | Status: DC

## 2017-07-09 NOTE — Assessment & Plan Note (Signed)
Try the DASH guidelines, less salt, start CCB; return in 2 weeks; avoid decongestants

## 2017-07-09 NOTE — Patient Instructions (Addendum)
Try to use PLAIN allergy medicine without the decongestant Avoid: phenylephrine, phenylpropanolamine, and pseudoephredine  Please call Dr. Mechele Collin to schedule an appointment for your symptoms  Start the amlodipine to help control your blood pressure and try to prevent a stroke If you ever think you are having a stroke, call 911 immediately Try to follow the DASH guidelines (DASH stands for Dietary Approaches to Stop Hypertension). Try to limit the sodium in your diet to no more than 1,500mg  of sodium per day. Certainly try to not exceed 2,000 mg per day at the very most. Do not add salt when cooking or at the table.  Check the sodium amount on labels when shopping, and choose items lower in sodium when given a choice. Avoid or limit foods that already contain a lot of sodium. Eat a diet rich in fruits and vegetables and whole grains, and try to lose weight if overweight or obese  Take magnesium pill, 250 or 400 mg of magnesium oxide every day  DASH Eating Plan DASH stands for "Dietary Approaches to Stop Hypertension." The DASH eating plan is a healthy eating plan that has been shown to reduce high blood pressure (hypertension). It may also reduce your risk for type 2 diabetes, heart disease, and stroke. The DASH eating plan may also help with weight loss. What are tips for following this plan? General guidelines  Avoid eating more than 2,300 mg (milligrams) of salt (sodium) a day. If you have hypertension, you may need to reduce your sodium intake to 1,500 mg a day.  Limit alcohol intake to no more than 1 drink a day for nonpregnant women and 2 drinks a day for men. One drink equals 12 oz of beer, 5 oz of wine, or 1 oz of hard liquor.  Work with your health care provider to maintain a healthy body weight or to lose weight. Ask what an ideal weight is for you.  Get at least 30 minutes of exercise that causes your heart to beat faster (aerobic exercise) most days of the week. Activities may  include walking, swimming, or biking.  Work with your health care provider or diet and nutrition specialist (dietitian) to adjust your eating plan to your individual calorie needs. Reading food labels  Check food labels for the amount of sodium per serving. Choose foods with less than 5 percent of the Daily Value of sodium. Generally, foods with less than 300 mg of sodium per serving fit into this eating plan.  To find whole grains, look for the word "whole" as the first word in the ingredient list. Shopping  Buy products labeled as "low-sodium" or "no salt added."  Buy fresh foods. Avoid canned foods and premade or frozen meals. Cooking  Avoid adding salt when cooking. Use salt-free seasonings or herbs instead of table salt or sea salt. Check with your health care provider or pharmacist before using salt substitutes.  Do not fry foods. Cook foods using healthy methods such as baking, boiling, grilling, and broiling instead.  Cook with heart-healthy oils, such as olive, canola, soybean, or sunflower oil. Meal planning   Eat a balanced diet that includes: ? 5 or more servings of fruits and vegetables each day. At each meal, try to fill half of your plate with fruits and vegetables. ? Up to 6-8 servings of whole grains each day. ? Less than 6 oz of lean meat, poultry, or fish each day. A 3-oz serving of meat is about the same size as a deck of  cards. One egg equals 1 oz. ? 2 servings of low-fat dairy each day. ? A serving of nuts, seeds, or beans 5 times each week. ? Heart-healthy fats. Healthy fats called Omega-3 fatty acids are found in foods such as flaxseeds and coldwater fish, like sardines, salmon, and mackerel.  Limit how much you eat of the following: ? Canned or prepackaged foods. ? Food that is high in trans fat, such as fried foods. ? Food that is high in saturated fat, such as fatty meat. ? Sweets, desserts, sugary drinks, and other foods with added sugar. ? Full-fat  dairy products.  Do not salt foods before eating.  Try to eat at least 2 vegetarian meals each week.  Eat more home-cooked food and less restaurant, buffet, and fast food.  When eating at a restaurant, ask that your food be prepared with less salt or no salt, if possible. What foods are recommended? The items listed may not be a complete list. Talk with your dietitian about what dietary choices are best for you. Grains Whole-grain or whole-wheat bread. Whole-grain or whole-wheat pasta. Brown rice. Modena Morrow. Bulgur. Whole-grain and low-sodium cereals. Pita bread. Low-fat, low-sodium crackers. Whole-wheat flour tortillas. Vegetables Fresh or frozen vegetables (raw, steamed, roasted, or grilled). Low-sodium or reduced-sodium tomato and vegetable juice. Low-sodium or reduced-sodium tomato sauce and tomato paste. Low-sodium or reduced-sodium canned vegetables. Fruits All fresh, dried, or frozen fruit. Canned fruit in natural juice (without added sugar). Meat and other protein foods Skinless chicken or [place]. Ground chicken or [place]. Pork with fat trimmed off. Fish and seafood. Egg whites. Dried beans, peas, or lentils. Unsalted nuts, nut butters, and seeds. Unsalted canned beans. Lean cuts of beef with fat trimmed off. Low-sodium, lean deli meat. Dairy Low-fat (1%) or fat-free (skim) milk. Fat-free, low-fat, or reduced-fat cheeses. Nonfat, low-sodium ricotta or cottage cheese. Low-fat or nonfat yogurt. Low-fat, low-sodium cheese. Fats and oils Soft margarine without trans fats. Vegetable oil. Low-fat, reduced-fat, or light mayonnaise and salad dressings (reduced-sodium). Canola, safflower, olive, soybean, and sunflower oils. Avocado. Seasoning and other foods Herbs. Spices. Seasoning mixes without salt. Unsalted popcorn and pretzels. Fat-free sweets. What foods are not recommended? The items listed may not be a complete list. Talk with your dietitian about what dietary choices are best  for you. Grains Baked goods made with fat, such as croissants, muffins, or some breads. Dry pasta or rice meal packs. Vegetables Creamed or fried vegetables. Vegetables in a cheese sauce. Regular canned vegetables (not low-sodium or reduced-sodium). Regular canned tomato sauce and paste (not low-sodium or reduced-sodium). Regular tomato and vegetable juice (not low-sodium or reduced-sodium). [name] Fava. Olives. Fruits Canned fruit in a light or heavy syrup. Fried fruit. Fruit in cream or butter sauce. Meat and other protein foods Fatty cuts of meat. Ribs. Fried meat. Berniece Salines. Sausage. Bologna and other processed lunch meats. Salami. Fatback. Hotdogs. Bratwurst. Salted nuts and seeds. Canned beans with added salt. Canned or smoked fish. Whole eggs or egg yolks. Chicken or [place] with skin. Dairy Whole or 2% milk, cream, and half-and-half. Whole or full-fat cream cheese. Whole-fat or sweetened yogurt. Full-fat cheese. Nondairy creamers. Whipped toppings. Processed cheese and cheese spreads. Fats and oils Butter. Stick margarine. Lard. Shortening. Ghee. Bacon fat. Tropical oils, such as coconut, palm kernel, or palm oil. Seasoning and other foods Salted popcorn and pretzels. Onion salt, garlic salt, seasoned salt, table salt, and sea salt. Worcestershire sauce. Tartar sauce. Barbecue sauce. Teriyaki sauce. Soy sauce, including reduced-sodium. Steak sauce. Canned and packaged  gravies. Fish sauce. Oyster sauce. Cocktail sauce. Horseradish that you find on the shelf. Ketchup. Mustard. Meat flavorings and tenderizers. Bouillon cubes. Hot sauce and Tabasco sauce. Premade or packaged marinades. Premade or packaged taco seasonings. Relishes. Regular salad dressings. Where to find more information:  National Heart, Lung, and Blood Institute: PopSteam.is  American Heart Association: www.heart.org Summary  The DASH eating plan is a healthy eating plan that has been shown to reduce high blood pressure  (hypertension). It may also reduce your risk for type 2 diabetes, heart disease, and stroke.  With the DASH eating plan, you should limit salt (sodium) intake to 2,300 mg a day. If you have hypertension, you may need to reduce your sodium intake to 1,500 mg a day.  When on the DASH eating plan, aim to eat more fresh fruits and vegetables, whole grains, lean proteins, low-fat dairy, and heart-healthy fats.  Work with your health care provider or diet and nutrition specialist (dietitian) to adjust your eating plan to your individual calorie needs. This information is not intended to replace advice given to you by your health care provider. Make sure you discuss any questions you have with your health care provider. Document Released: 04/13/2011 Document Revised: 04/17/2016 Document Reviewed: 04/17/2016 Elsevier Interactive Patient Education  [employer].

## 2017-07-09 NOTE — Assessment & Plan Note (Signed)
We'll check fasting glucose and A1c at f/u in 2 weeks

## 2017-07-09 NOTE — Assessment & Plan Note (Signed)
See Dr. Mechele Collin, patient will call to schedule appt; avoid food hard to swallow

## 2017-07-09 NOTE — Progress Notes (Signed)
BP (!) 154/76 (BP Location: Right Arm, Patient Position: Sitting, Cuff Size: Large)   Pulse 96   Temp 97.8 F (36.6 C) (Oral)   Resp 16   Ht 5\' 6"  (1.676 m)   Wt 179 lb 9.6 oz (81.5 kg)   SpO2 96%   BMI 28.99 kg/m    Subjective:    Patient ID: Amy Gregory, female    DOB: [birth year], 62 y.o.   MRN: [id number]  HPI: Amy Gregory is a 62 y.o. female  Chief Complaint  Patient presents with  . Elevated BP  . Palpitations    Off and on for the past month    HPI   Patient is here for a visit for HTN; she does not want a stroke, but does not like to take medicine; using sudafed lately; add some salt to her food; mother had HTN; mother and father had strokes, sisters have HTN  She gets palpitations maybe 1x a week; does get chest pain at times; after eating, she gets trouble swallowing and pain in the midchest; worse with bread or potatoes or meat; she has had to have esophagus dilated before; Dr. Mechele Collin; she says she will call to make appt  Sinuses are bothering her about 2 weeks; congestion; was taking sudafed for that; also alka seltzer; not taking nasal sprays right now  She is seeing Dr. Algis Downs for uterine prolapse; has pessary and that's working well; still having some sweating, hot flashes; reviewed medicine  High cholesterol; rarely eats fried foods; TG were 228; already eaten today  Glucose has been prediabetes, no diabetes; father had DM  Depression screen [facility] 2/9 07/09/2017 12/21/2016 07/04/2016 07/13/2015  Decreased Interest 0 0 0 0  Down, Depressed, Hopeless 0 0 0 0  PHQ - 2 Score 0 0 0 0    Relevant past medical, surgical, family and social history reviewed Past Medical History:  Diagnosis Date  . Acquired left foot drop 02/22/2017  . Bilateral hearing loss   . Dysphagia   . GERD (gastroesophageal reflux disease)   . Hiatal hernia    small  . History of diverticulitis   . Impaired memory   . Nephrolithiasis   . OA (osteoarthritis)   . Osteoporosis  02/07/2017  . Rheumatoid arthritis (HCC)    managed by Dr. Gavin Potters   Past Surgical History:  Procedure Laterality Date  . ABDOMINAL HYSTERECTOMY  2002   for fibroids, non cancerous reasons, ovaries remain  . BREAST BIOPSY Left 2005-ish   benign  . CATARACT EXTRACTION  2014   OU  . COLONOSCOPY  04/26/04  . ESOPHAGEAL DILATION    . ESOPHAGOGASTRODUODENOSCOPY  02/01/05 and 07/13/14  . INTRAMEDULLARY (IM) NAIL INTERTROCHANTERIC Right 10/17/2016   Procedure: INTRAMEDULLARY (IM) NAIL INTERTROCHANTRIC;  Surgeon: Christena Flake, MD;  Location: [facility] ORS;  Service: Orthopedics;  Laterality: Right;  . KNEE SURGERY Right 03/2014   partial medial and lateral meniscectomy and medial condryle debridement  . LEG SURGERY  2013   fractured left leg, toes, hospitalized for 2 weeks  . ORIF TIBIA FRACTURE     remote ORIF proximal left tiba fracture   Family History  Problem Relation Age of Onset  . Heart disease Mother   . Heart attack Mother   . Rheum arthritis Mother   . Stroke Mother   . Arthritis Mother        RA  . Hypertension Mother   . Stroke Father   . Diabetes Father   .  Cancer Sister        lymphoma  . Heart attack Maternal Uncle   . Cancer Maternal Grandmother        leukemia  . Arthritis Maternal Grandmother   . Depression Maternal Grandfather   . Stroke Paternal Grandmother   . Cancer Paternal Grandfather   . Arthritis Sister        rheumatitis  . Thyroid disease Sister   . Hypertension Sister   . Cancer Sister        lung  . Depression Sister   . Breast cancer Paternal Aunt 7  . Kidney cancer Neg Hx   . Bladder Cancer Neg Hx    Social History   Tobacco Use  . Smoking status: Former Smoker    Packs/day: 1.00    Years: 3.00    Pack years: 3.00    Types: Cigarettes    Start date: 05/08/1966    Last attempt to quit: 05/08/1969    Years since quitting: 48.2  . Smokeless tobacco: Never Used  Substance Use Topics  . Alcohol use: No    Alcohol/week: 0.0 oz  . Drug use:  No    Interim medical history since last visit reviewed. Allergies and medications reviewed  Review of Systems  Constitutional: Negative for fever.  Cardiovascular: Positive for palpitations.  Skin: Negative for rash.   Per HPI unless specifically indicated above     Objective:    BP (!) 154/76 (BP Location: Right Arm, Patient Position: Sitting, Cuff Size: Large)   Pulse 96   Temp 97.8 F (36.6 C) (Oral)   Resp 16   Ht 5\' 6"  (1.676 m)   Wt 179 lb 9.6 oz (81.5 kg)   SpO2 96%   BMI 28.99 kg/m   Wt Readings from Last 3 Encounters:  07/09/17 179 lb 9.6 oz (81.5 kg)  07/05/17 177 lb 3.2 oz (80.4 kg)  06/18/17 178 lb 14.4 oz (81.1 kg)    Physical Exam  Constitutional: She appears well-developed and well-nourished.  HENT:  Right Ear: Tympanic membrane is not erythematous. A middle ear effusion is present.  Left Ear: Tympanic membrane is not erythematous.  No middle ear effusion.  Nose: Rhinorrhea (clear) present.  Mouth/Throat: Oropharynx is clear and moist and mucous membranes are normal.  Eyes: EOM are normal. No scleral icterus.  Cardiovascular: Normal rate and regular rhythm.  No extrasystoles are present.  Pulmonary/Chest: Effort normal and breath sounds normal.  Lymphadenopathy:    She has no cervical adenopathy.  Psychiatric: She has a normal mood and affect. Her behavior is normal.    Results for orders placed or performed in visit on 06/18/17  Bladder Scan (Post Void Residual) in office  Result Value Ref Range   Scan Result 43       Assessment & Plan:   Problem List Items Addressed This Visit      Cardiovascular and Mediastinum   Essential hypertension, benign    Try the DASH guidelines, less salt, start CCB; return in 2 weeks; avoid decongestants      Relevant Medications   amLODipine (NORVASC) 5 MG tablet     Digestive   Dysphagia    See Dr. 08/16/17, patient will call to schedule appt; avoid food hard to swallow        Other   Elevated  glucose    We'll check fasting glucose and A1c at f/u in 2 weeks       Other Visit Diagnoses    Palpitations    -  Primary   Relevant Orders   EKG 12-Lead (Completed)   Sinus congestion       not to the point of needing antibiiotics; will use nasal spray; avoid Sudafed       Follow up plan: Return in about 2 weeks (around 07/23/2017) for follow-up visit with Dr. Sherie Don.  An after-visit summary was printed and given to the patient at check-out.  Please see the patient instructions which may contain other information and recommendations beyond what is mentioned above in the assessment and plan.  Meds ordered this encounter  Medications  . fluticasone (FLONASE) 50 MCG/ACT nasal spray    Sig: Place 2 sprays into both nostrils daily.    Dispense:  16 g    Refill:  11  . amLODipine (NORVASC) 5 MG tablet    Sig: Take 1 tablet (5 mg total) by mouth daily.    Dispense:  90 tablet    Refill:  3    Orders Placed This Encounter  Procedures  . EKG 12-Lead

## 2017-07-23 ENCOUNTER — Encounter: Payer: Self-pay | Admitting: Family Medicine

## 2017-07-23 ENCOUNTER — Ambulatory Visit: Payer: BLUE CROSS/BLUE SHIELD | Admitting: Family Medicine

## 2017-07-23 VITALS — BP 132/84 | HR 85 | Temp 98.0°F | Wt 178.9 lb

## 2017-07-23 DIAGNOSIS — R1310 Dysphagia, unspecified: Secondary | ICD-10-CM | POA: Diagnosis not present

## 2017-07-23 DIAGNOSIS — M810 Age-related osteoporosis without current pathological fracture: Secondary | ICD-10-CM

## 2017-07-23 DIAGNOSIS — R29898 Other symptoms and signs involving the musculoskeletal system: Secondary | ICD-10-CM | POA: Diagnosis not present

## 2017-07-23 DIAGNOSIS — E559 Vitamin D deficiency, unspecified: Secondary | ICD-10-CM

## 2017-07-23 DIAGNOSIS — R1319 Other dysphagia: Secondary | ICD-10-CM

## 2017-07-23 DIAGNOSIS — M059 Rheumatoid arthritis with rheumatoid factor, unspecified: Secondary | ICD-10-CM | POA: Diagnosis not present

## 2017-07-23 DIAGNOSIS — E781 Pure hyperglyceridemia: Secondary | ICD-10-CM

## 2017-07-23 DIAGNOSIS — R7309 Other abnormal glucose: Secondary | ICD-10-CM | POA: Diagnosis not present

## 2017-07-23 DIAGNOSIS — Z5181 Encounter for therapeutic drug level monitoring: Secondary | ICD-10-CM | POA: Diagnosis not present

## 2017-07-23 DIAGNOSIS — I10 Essential (primary) hypertension: Secondary | ICD-10-CM | POA: Diagnosis not present

## 2017-07-23 NOTE — Assessment & Plan Note (Signed)
Check liver and kidneys 

## 2017-07-23 NOTE — Progress Notes (Signed)
BP 132/84 (BP Location: Left Arm, Patient Position: Sitting, Cuff Size: Large)   Pulse 85   Temp 98 F (36.7 C) (Oral)   Wt 178 lb 14.4 oz (81.1 kg)   SpO2 98%   BMI 28.88 kg/m    Subjective:    Patient ID: Amy Gregory, female    DOB: [birth year], 62 y.o.   MRN: [id number]  HPI: Amy Gregory is a 62 y.o. female  Chief Complaint  Patient presents with  . Hypertension  . Follow-up  . Headache    HPI Patient is here for f/u Using a walker because of left foot drop; 30% damage to the nerve in the leg; they did a test on it last year; nothing but gave her a brace and did therapy They won't let her go back to work with the walker; they are trying to get her disability but she has been turned down She is still being bothered by her right knee; she has pain in the right knee; the surgery was a total knee replacement with  Dr. Erin Sons; she was doing great with that until she fell and broke her hip; she just fell and it broke because she has soft bones; she sees Dr. Lavenia Atlas for the shots for osteoporosis; taking calcium with vitamin D, taking multiple vitamins; practicing good fall precautions HTN; on medicine; not checking BP away from the doctor; HTN runs in her family; she has cut back on her salt some RA managed by Dr. Lavenia Atlas; taking MTX and folic acid GERD; managed pretty well; goes to [facility]hele [facility] at [facility]; no blood in the stool, no abd pain; occasional dysphagia, she'll call GI High cholesterol; last TG were 228; last HDL 40; fasting today Hyperglycemia; father had DM; occasional dry mouth from MTX; has urinary frequency  Depression screen [facility] 2/9 07/23/2017 07/09/2017 12/21/2016 07/04/2016 07/13/2015  Decreased Interest 0 0 0 0 0  Down, Depressed, Hopeless 0 0 0 0 0  PHQ - 2 Score 0 0 0 0 0    Relevant past medical, surgical, family and social history reviewed Past Medical History:  Diagnosis Date  . Acquired left foot drop 02/22/2017  . Bilateral  hearing loss   . Dysphagia   . GERD (gastroesophageal reflux disease)   . Hiatal hernia    small  . History of diverticulitis   . Impaired memory   . Nephrolithiasis   . OA (osteoarthritis)   . Osteoporosis 02/07/2017  . Rheumatoid arthritis (HCC)    managed by Dr. Gavin Potters   Past Surgical History:  Procedure Laterality Date  . ABDOMINAL HYSTERECTOMY  2002   for fibroids, non cancerous reasons, ovaries remain  . BREAST BIOPSY Left 2005-ish   benign  . CATARACT EXTRACTION  2014   OU  . COLONOSCOPY  04/26/04  . ESOPHAGEAL DILATION    . ESOPHAGOGASTRODUODENOSCOPY  02/01/05 and 07/13/14  . INTRAMEDULLARY (IM) NAIL INTERTROCHANTERIC Right 10/17/2016   Procedure: INTRAMEDULLARY (IM) NAIL INTERTROCHANTRIC;  Surgeon: Christena Flake, MD;  Location: [facility] ORS;  Service: Orthopedics;  Laterality: Right;  . KNEE SURGERY Right 03/2014   partial medial and lateral meniscectomy and medial condryle debridement  . LEG SURGERY  2013   fractured left leg, toes, hospitalized for 2 weeks  . ORIF TIBIA FRACTURE     remote ORIF proximal left tiba fracture   Family History  Problem Relation Age of Onset  . Heart disease Mother   . Heart attack Mother   .  Rheum arthritis Mother   . Stroke Mother   . Arthritis Mother        RA  . Hypertension Mother   . Stroke Father   . Diabetes Father   . Cancer Sister        lymphoma  . Heart attack Maternal Uncle   . Cancer Maternal Grandmother        leukemia  . Arthritis Maternal Grandmother   . Depression Maternal Grandfather   . Stroke Paternal Grandmother   . Cancer Paternal Grandfather   . Arthritis Sister        rheumatitis  . Thyroid disease Sister   . Hypertension Sister   . Cancer Sister        lung  . Depression Sister   . Breast cancer Paternal Aunt 36  . Kidney cancer Neg Hx   . Bladder Cancer Neg Hx    Social History   Tobacco Use  . Smoking status: Former Smoker    Packs/day: 1.00    Years: 3.00    Pack years: 3.00    Types:  Cigarettes    Start date: 05/08/1966    Last attempt to quit: 05/08/1969    Years since quitting: 48.2  . Smokeless tobacco: Never Used  Substance Use Topics  . Alcohol use: No    Alcohol/week: 0.0 oz  . Drug use: No    Interim medical history since last visit reviewed. Allergies and medications reviewed  Review of Systems Per HPI unless specifically indicated above     Objective:    BP 132/84 (BP Location: Left Arm, Patient Position: Sitting, Cuff Size: Large)   Pulse 85   Temp 98 F (36.7 C) (Oral)   Wt 178 lb 14.4 oz (81.1 kg)   SpO2 98%   BMI 28.88 kg/m   Wt Readings from Last 3 Encounters:  07/23/17 178 lb 14.4 oz (81.1 kg)  07/09/17 179 lb 9.6 oz (81.5 kg)  07/05/17 177 lb 3.2 oz (80.4 kg)    Physical Exam  Constitutional: She appears well-developed and well-nourished. No distress.  HENT:  Head: Normocephalic and atraumatic.  Eyes: EOM are normal. No scleral icterus.  Neck: No thyromegaly present.  Cardiovascular: Normal rate, regular rhythm and normal heart sounds.  No murmur heard. Pulmonary/Chest: Effort normal and breath sounds normal. No respiratory distress. She has no wheezes.  Abdominal: Soft. Bowel sounds are normal. She exhibits no distension.  Musculoskeletal: She exhibits no edema.       Right knee: She exhibits decreased range of motion and swelling. Tenderness (diffusely) found.  Neurological: She is alert. She exhibits normal muscle tone.  Leg weakness, dorsiflexion 4-/5 on the LEFT  Skin: Skin is warm and dry. She is not diaphoretic. No pallor.  Psychiatric: She has a normal mood and affect. Her behavior is normal. Judgment and thought content normal.    Results for orders placed or performed in visit on 06/18/17  Bladder Scan (Post Void Residual) in office  Result Value Ref Range   Scan Result 43       Assessment & Plan:   Problem List Items Addressed This Visit      Cardiovascular and Mediastinum   Essential hypertension, benign     Fair control; try the DASH guidelines; continue medicine        Digestive   Dysphagia    Encouraged patient to call Dr. Mechele Collin to see about getting EGD; avoid hard bread and big chunks of meat  Nervous and Auditory   Left leg weakness    Offered referral to neurologist      Relevant Orders   Ambulatory referral to Neurology     Musculoskeletal and Integument   Osteoporosis   Relevant Orders   VITAMIN D 25 Hydroxy (Vit-D Deficiency, Fractures)   Rheumatoid arthritis (HCC)    Managed by rheumatologist      Relevant Orders   VITAMIN D 25 Hydroxy (Vit-D Deficiency, Fractures)     Other   Vitamin D deficiency    Last level was normal; patient wants me to check it again; risk for low vit D with RA and she has osteoporosis, so important to monitor      Relevant Orders   VITAMIN D 25 Hydroxy (Vit-D Deficiency, Fractures)   Medication monitoring encounter    Check liver and kidneys      Relevant Orders   CBC with Differential/Platelet   COMPLETE METABOLIC PANEL WITH GFR   Elevated glucose    Check glucose and A1c (fasting)      Relevant Orders   Hemoglobin A1c    Other Visit Diagnoses    High triglycerides    -  Primary   Relevant Orders   Lipid panel       Follow up plan: Return in about 6 months (around 01/23/2018) for follow-up visit with Dr. Sherie Don.  An after-visit summary was printed and given to the patient at check-out.  Please see the patient instructions which may contain other information and recommendations beyond what is mentioned above in the assessment and plan.  No orders of the defined types were placed in this encounter.   Orders Placed This Encounter  Procedures  . CBC with Differential/Platelet  . COMPLETE METABOLIC PANEL WITH GFR  . Hemoglobin A1c  . Lipid panel  . VITAMIN D 25 Hydroxy (Vit-D Deficiency, Fractures)  . Ambulatory referral to Neurology

## 2017-07-23 NOTE — Patient Instructions (Addendum)
If you have not heard anything from my staff in a week about any orders/referrals/studies from today, please contact us here to follow-up [phone] Try to follow the DASH guidelines (DASH stands for Dietary Approaches to Stop Hypertension). Try to limit the sodium in your diet to no more than 1,500mg  of sodium per day. Certainly try to not exceed 2,000 mg per day at the very most. Do not add salt when cooking or at the table.  Check the sodium amount on labels when shopping, and choose items lower in sodium when given a choice. Avoid or limit foods that already contain a lot of sodium. Eat a diet rich in fruits and vegetables and whole grains, and try to lose weight if overweight or obese Check out the information at familydoctor.org entitled "Nutrition for Weight Loss: What You Need to Know about Fad Diets" Try to lose between 1-2 pounds per week by taking in fewer calories and burning off more calories You can succeed by limiting portions, limiting foods dense in calories and fat, becoming more active, and drinking 8 glasses of water a day (64 ounces) Don't skip meals, especially breakfast, as skipping meals may alter your metabolism Do not use over-the-counter weight loss pills or gimmicks that claim rapid weight loss A healthy BMI (or body mass index) is between 18.5 and 24.9 You can calculate your ideal BMI at the NIH website [employer] Try to limit saturated fats in your diet (bologna, hot dogs, barbeque, cheeseburgers, hamburgers, steak, bacon, sausage, cheese, etc.) and get more fresh fruits, vegetables, and whole grains

## 2017-07-23 NOTE — Assessment & Plan Note (Signed)
Offered referral to neurologist

## 2017-07-23 NOTE — Assessment & Plan Note (Signed)
Last level was normal; patient wants me to check it again; risk for low vit D with RA and she has osteoporosis, so important to monitor

## 2017-07-23 NOTE — Assessment & Plan Note (Signed)
Encouraged patient to call Dr. Mechele Collin to see about getting EGD; avoid hard bread and big chunks of meat

## 2017-07-23 NOTE — Assessment & Plan Note (Signed)
Managed by rheumatologist.

## 2017-07-23 NOTE — Assessment & Plan Note (Signed)
Fair control; try the DASH guidelines; continue medicine

## 2017-07-23 NOTE — Assessment & Plan Note (Signed)
Check glucose and A1c (fasting) 

## 2017-07-24 LAB — CBC WITH DIFFERENTIAL/PLATELET
Basophils Absolute: 50 cells/uL (ref 0–200)
Basophils Relative: 0.9 %
Eosinophils Absolute: 83 cells/uL (ref 15–500)
Eosinophils Relative: 1.5 %
HCT: 38.1 % (ref 35.0–45.0)
Hemoglobin: 12.9 g/dL (ref 11.7–15.5)
Lymphs Abs: 2107 cells/uL (ref 850–3900)
MCH: 31.2 pg (ref 27.0–33.0)
MCHC: 33.9 g/dL (ref 32.0–36.0)
MCV: 92.3 fL (ref 80.0–100.0)
MPV: 11 fL (ref 7.5–12.5)
Monocytes Relative: 6.2 %
Neutro Abs: 2921 cells/uL (ref 1500–7800)
Neutrophils Relative %: 53.1 %
Platelets: 291 10*3/uL (ref 140–400)
RBC: 4.13 10*6/uL (ref 3.80–5.10)
RDW: 12.2 % (ref 11.0–15.0)
Total Lymphocyte: 38.3 %
WBC mixed population: 341 cells/uL (ref 200–950)
WBC: 5.5 10*3/uL (ref 3.8–10.8)

## 2017-07-24 LAB — LIPID PANEL
Cholesterol: 180 mg/dL (ref <200)
HDL: 43 mg/dL — ABNORMAL LOW (ref >50)
LDL Cholesterol (Calc): 111 mg/dL (calc) — ABNORMAL HIGH
Non-HDL Cholesterol (Calc): 137 mg/dL (calc) — ABNORMAL HIGH (ref <130)
Total CHOL/HDL Ratio: 4.2 (calc) (ref <5.0)
Triglycerides: 143 mg/dL (ref <150)

## 2017-07-24 LAB — COMPLETE METABOLIC PANEL WITH GFR
AG Ratio: 1.8 (calc) (ref 1.0–2.5)
ALT: 23 U/L (ref 6–29)
AST: 24 U/L (ref 10–35)
Albumin: 4.6 g/dL (ref 3.6–5.1)
Alkaline phosphatase (APISO): 57 U/L (ref 33–130)
BUN/Creatinine Ratio: 36 (calc) — ABNORMAL HIGH (ref 6–22)
BUN: 16 mg/dL (ref 7–25)
CO2: 28 mmol/L (ref 20–32)
Calcium: 8.8 mg/dL (ref 8.6–10.4)
Chloride: 106 mmol/L (ref 98–110)
Creat: 0.44 mg/dL — ABNORMAL LOW (ref 0.50–0.99)
GFR, Est African American: 127 mL/min/{1.73_m2} (ref >=60)
GFR, Est Non African American: 110 mL/min/{1.73_m2} (ref >=60)
Globulin: 2.5 g/dL (calc) (ref 1.9–3.7)
Glucose, Bld: 108 mg/dL — ABNORMAL HIGH (ref 65–99)
Potassium: 4.5 mmol/L (ref 3.5–5.3)
Sodium: 140 mmol/L (ref 135–146)
Total Bilirubin: 0.8 mg/dL (ref 0.2–1.2)
Total Protein: 7.1 g/dL (ref 6.1–8.1)

## 2017-07-24 LAB — HEMOGLOBIN A1C
Hgb A1c MFr Bld: 5.7 % of total Hgb — ABNORMAL HIGH (ref <5.7)
Mean Plasma Glucose: 117 (calc)
eAG (mmol/L): 6.5 (calc)

## 2017-07-24 LAB — VITAMIN D 25 HYDROXY (VIT D DEFICIENCY, FRACTURES): Vit D, 25-Hydroxy: 41 ng/mL (ref 30–100)

## 2017-09-05 ENCOUNTER — Other Ambulatory Visit: Payer: Self-pay | Admitting: Neurology

## 2017-09-05 ENCOUNTER — Ambulatory Visit: Payer: BLUE CROSS/BLUE SHIELD | Admitting: Podiatry

## 2017-09-05 DIAGNOSIS — M5416 Radiculopathy, lumbar region: Secondary | ICD-10-CM

## 2017-09-10 ENCOUNTER — Ambulatory Visit: Payer: BLUE CROSS/BLUE SHIELD | Admitting: Podiatry

## 2017-09-10 ENCOUNTER — Encounter: Payer: Self-pay | Admitting: Podiatry

## 2017-09-10 ENCOUNTER — Ambulatory Visit (INDEPENDENT_AMBULATORY_CARE_PROVIDER_SITE_OTHER): Payer: BLUE CROSS/BLUE SHIELD

## 2017-09-10 ENCOUNTER — Other Ambulatory Visit: Payer: Self-pay | Admitting: Podiatry

## 2017-09-10 ENCOUNTER — Encounter

## 2017-09-10 DIAGNOSIS — M2032 Hallux varus (acquired), left foot: Secondary | ICD-10-CM | POA: Diagnosis not present

## 2017-09-10 DIAGNOSIS — S92414D Nondisplaced fracture of proximal phalanx of right great toe, subsequent encounter for fracture with routine healing: Secondary | ICD-10-CM

## 2017-09-10 DIAGNOSIS — M778 Other enthesopathies, not elsewhere classified: Secondary | ICD-10-CM

## 2017-09-10 DIAGNOSIS — M779 Enthesopathy, unspecified: Secondary | ICD-10-CM

## 2017-09-11 NOTE — Progress Notes (Signed)
She presents today chief complaint of pain to the hallux left.  She has dropfoot and this toe is starting to hurt me again.  Objective: Vital signs are stable alert and oriented x3 pulses are palpable.  Presents walking with a walker today dropfoot to the left lower extremity contracted hallux interphalangeal joint.  Radiographs demonstrate osteoarthritic change.  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  Plan: After sterile Betadine skin prep I injected 2 mg of dexamethasone and local anesthetic to the hallux interphalangeal joint.  She tolerated procedure well.  We discussed the need for surgical intervention.  She will be following up with her neurologist in the near future to see why she has dropfoot.  They really do not understand why she has it to the best of my understanding.

## 2017-09-12 ENCOUNTER — Encounter: Payer: BLUE CROSS/BLUE SHIELD | Admitting: Obstetrics and Gynecology

## 2017-09-13 ENCOUNTER — Ambulatory Visit
Admission: RE | Admit: 2017-09-13 | Discharge: 2017-09-13 | Disposition: A | Discharge status: Home / Self Care | Payer: BLUE CROSS/BLUE SHIELD | Source: Ambulatory Visit | Attending: Neurology | Admitting: Neurology

## 2017-09-13 DIAGNOSIS — M5416 Radiculopathy, lumbar region: Secondary | ICD-10-CM | POA: Diagnosis present

## 2017-09-13 DIAGNOSIS — R531 Weakness: Secondary | ICD-10-CM | POA: Diagnosis not present

## 2017-09-13 DIAGNOSIS — M4726 Other spondylosis with radiculopathy, lumbar region: Secondary | ICD-10-CM | POA: Diagnosis not present

## 2017-09-25 ENCOUNTER — Ambulatory Visit: Payer: Self-pay | Admitting: *Deleted

## 2017-09-25 NOTE — Telephone Encounter (Signed)
Patient is calling to report a rash on inner arms that is itching. She is not sure of the cause and reports no new exposures. Appointment for evaluation given.  Reason for Disposition . [1] Severe localized itching AND [2] after 2 days of steroid cream  Answer Assessment - Initial Assessment Questions 1. APPEARANCE of RASH: "Describe the rash."      Big red bumps 2. LOCATION: "Where is the rash located?"      Wrist to elbows- inside of arms- both arms 3. NUMBER: "How many spots are there?"      alot 4. SIZE: "How big are the spots?" (Inches, centimeters or compare to size of a coin)      nickel size 5. ONSET: "When did the rash start?"      3-4 days ago 6. ITCHING: "Does the rash itch?" If so, ask: "How bad is the itch?"  (Scale 1-10; or mild, moderate, severe)     Yes- moderate 7. PAIN: "Does the rash hurt?" If so, ask: "How bad is the pain?"  (Scale 1-10; or mild, moderate, severe)     no 8. OTHER SYMPTOMS: "Do you have any other symptoms?" (e.g., fever)     Headache 9. PREGNANCY: "Is there any chance you are pregnant?" "When was your last menstrual period?"     n/a  Protocols used: RASH OR REDNESS - LOCALIZED-A-AH

## 2017-09-26 ENCOUNTER — Ambulatory Visit: Payer: Self-pay | Admitting: Nurse Practitioner

## 2017-09-26 NOTE — Telephone Encounter (Signed)
Please advise 

## 2017-09-28 NOTE — Telephone Encounter (Signed)
Called patient left message for her to call office for appointment for provider to see rash and prescribe medication if needed

## 2017-10-24 ENCOUNTER — Ambulatory Visit: Payer: BLUE CROSS/BLUE SHIELD | Admitting: Obstetrics and Gynecology

## 2017-10-24 ENCOUNTER — Encounter: Payer: Self-pay | Admitting: Obstetrics and Gynecology

## 2017-10-24 VITALS — BP 170/84 | HR 83 | Ht 66.0 in | Wt 180.9 lb

## 2017-10-24 DIAGNOSIS — Z4689 Encounter for fitting and adjustment of other specified devices: Secondary | ICD-10-CM | POA: Diagnosis not present

## 2017-10-24 DIAGNOSIS — S30814A Abrasion of vagina and vulva, initial encounter: Secondary | ICD-10-CM

## 2017-10-24 DIAGNOSIS — N952 Postmenopausal atrophic vaginitis: Secondary | ICD-10-CM | POA: Diagnosis not present

## 2017-10-24 DIAGNOSIS — Z124 Encounter for screening for malignant neoplasm of cervix: Secondary | ICD-10-CM

## 2017-10-24 DIAGNOSIS — N3941 Urge incontinence: Secondary | ICD-10-CM | POA: Diagnosis not present

## 2017-10-24 LAB — POCT URINALYSIS DIPSTICK
Bilirubin, UA: NEGATIVE
Blood, UA: NEGATIVE
Glucose, UA: NEGATIVE
Ketones, UA: NEGATIVE
Nitrite, UA: NEGATIVE
Odor: NEGATIVE
Protein, UA: NEGATIVE
Spec Grav, UA: <=1.005 — AB (ref 1.010–1.025)
Urobilinogen, UA: 0.2 E.U./dL
pH, UA: 6.5 (ref 5.0–8.0)

## 2017-10-24 NOTE — Patient Instructions (Signed)
1.  Urinalysis today looks normal 2.  Urine culture is obtained to rule out UTI 3.  Pessary is removed today.  It is not reinserted because there is vaginal abrasion. 4.  Leave pessary out for 2 weeks; continue to use Premarin cream intravaginal once a week; do not insert Trimosan during this time 5.  Return in 2 weeks for follow-up and pessary insertion

## 2017-10-26 NOTE — Progress Notes (Signed)
GYN ENCOUNTER NOTE  Subjective:       Amy Gregory is a 62 y.o. G45P3003 female is here for gynecologic evaluation of the following issues:  1.  Pessary maintenance. 2.  Urinary urge symptoms  3.  Vaginal atrophy 4.  Needs Pap smear  The patient presents today for follow-up on pessary maintenance and also needs Pap smear screening.  She is complaining of some UTI symptoms with urgency and dysuria.  With the #3 ring with diaphragm support pessary in place she has been voiding well and not experiencing significant leakage. She reports slight increased vaginal discharge.  She has not noted any vaginal odor or vaginal bleeding. Amy Gregory is using Premarin cream intravaginal once a week and Trimosan gel intravaginal once a week.   Gynecologic History No LMP recorded. Patient has had a hysterectomy.  Status post LSH   Obstetric History OB History  Gravida Para Term Preterm AB Living  3 3 3     3   SAB TAB Ectopic Multiple Live Births          3    # Outcome Date GA Lbr Len/2nd Weight Sex Delivery Anes PTL Lv  3 Term 1985   8 lb 14.4 oz (4.037 kg) M Vag-Spont   LIV  2 Term 1982   6 lb 1.8 oz (2.771 kg)  Vag-Spont   LIV  1 Term 1980   7 lb 1.9 oz (3.23 kg) F Vag-Spont   LIV    Past Medical History:  Diagnosis Date  . Acquired left foot drop 02/22/2017  . Bilateral hearing loss   . Dysphagia   . GERD (gastroesophageal reflux disease)   . Hiatal hernia    small  . History of diverticulitis   . Impaired memory   . Nephrolithiasis   . OA (osteoarthritis)   . Osteoporosis 02/07/2017  . Rheumatoid arthritis (HCC)    managed by Dr. 04/09/2017    Past Surgical History:  Procedure Laterality Date  . ABDOMINAL HYSTERECTOMY  2002   for fibroids, non cancerous reasons, ovaries remain  . BREAST BIOPSY Left 2005-ish   benign  . CATARACT EXTRACTION  2014   OU  . COLONOSCOPY  04/26/04  . ESOPHAGEAL DILATION    . ESOPHAGOGASTRODUODENOSCOPY  02/01/05 and 07/13/14  . INTRAMEDULLARY (IM) NAIL  INTERTROCHANTERIC Right 10/17/2016   Procedure: INTRAMEDULLARY (IM) NAIL INTERTROCHANTRIC;  Surgeon: 12/17/2016, MD;  Location: [facility] ORS;  Service: Orthopedics;  Laterality: Right;  . KNEE SURGERY Right 03/2014   partial medial and lateral meniscectomy and medial condryle debridement  . LEG SURGERY  2013   fractured left leg, toes, hospitalized for 2 weeks  . ORIF TIBIA FRACTURE     remote ORIF proximal left tiba fracture    Current Outpatient Medications on File Prior to Visit  Medication Sig Dispense Refill  . amLODipine (NORVASC) 5 MG tablet Take 1 tablet (5 mg total) by mouth daily. 90 tablet 3  . Ascorbic Acid (VITAMIN C) 100 MG tablet Take 100 mg by mouth daily.    . Calcium-Phosphorus-Vitamin D (CALCIUM GUMMIES PO) Take 2 each by mouth daily.    . cholecalciferol (VITAMIN D) 1000 units tablet Take 1,000 Units by mouth daily.    2014 conjugated estrogens (PREMARIN) vaginal cream Place 1 Applicatorful vaginally daily. Apply 0.5mg  (pea-sized amount)  just inside the vaginal introitus with a finger-tip every night for two weeks and then Monday, Wednesday and Friday nights. 30 g 12  . Denosumab (PROLIA Henry) Inject 1  each into the skin every 6 (six) months. Injection every 6 months    . diphenhydramine-acetaminophen (TYLENOL PM) 25-500 MG TABS tablet Take by mouth.    . fluticasone (FLONASE) 50 MCG/ACT nasal spray Place 2 sprays into both nostrils daily. 16 g 11  . folic acid (FOLVITE) 1 MG tablet TAKE 1 TABLET BY MOUTH ONCE DAILY    . methotrexate (RHEUMATREX) 2.5 MG tablet Take 15 mg by mouth once a week. Take 6  2.5mg  pills once a week.    . Multiple Vitamin (MULTIVITAMIN) capsule Take by mouth.    Dani Gobble SULFATE VAGINAL (TRIMO-SAN) 0.025 % GEL Place vaginally.    . pantoprazole (PROTONIX) 40 MG tablet TAKE 1 TABLET BY MOUTH ONCE DAILY.    . Trospium Chloride 60 MG CP24 Take 1 capsule (60 mg total) by mouth daily. 90 each 3   No current facility-administered medications on file  prior to visit.     Allergies  Allergen Reactions  . Sulfa Antibiotics   . Cefdinir Rash  . Oxycodone Rash    PER PATIENT ON 7.25.18    Social History   Socioeconomic History  . Marital status: Married    Spouse name: Not on file  . Number of children: Not on file  . Years of education: Not on file  . Highest education level: Not on file  Occupational History  . Not on file  Social Needs  . Financial resource strain: Not on file  . Food insecurity:    Worry: Not on file    Inability: Not on file  . Transportation needs:    Medical: Not on file    Non-medical: Not on file  Tobacco Use  . Smoking status: Former Smoker    Packs/day: 1.00    Years: 3.00    Pack years: 3.00    Types: Cigarettes    Start date: 05/08/1966    Last attempt to quit: 05/08/1969    Years since quitting: 48.5  . Smokeless tobacco: Never Used  Substance and Sexual Activity  . Alcohol use: No    Alcohol/week: 0.0 oz  . Drug use: No  . Sexual activity: Yes    Birth control/protection: Post-menopausal  Lifestyle  . Physical activity:    Days per week: Not on file    Minutes per session: Not on file  . Stress: Not on file  Relationships  . Social connections:    Talks on phone: Not on file    Gets together: Not on file    Attends religious service: Not on file    Active member of club or organization: Not on file    Attends meetings of clubs or organizations: Not on file    Relationship status: Not on file  . Intimate partner violence:    Fear of current or ex partner: Not on file    Emotionally abused: Not on file    Physically abused: Not on file    Forced sexual activity: Not on file  Other Topics Concern  . Not on file  Social History Narrative  . Not on file    Family History  Problem Relation Age of Onset  . Heart disease Mother   . Heart attack Mother   . Rheum arthritis Mother   . Stroke Mother   . Arthritis Mother        RA  . Hypertension Mother   . Stroke Father   .  Diabetes Father   . Cancer Sister  lymphoma  . Heart attack Maternal Uncle   . Cancer Maternal Grandmother        leukemia  . Arthritis Maternal Grandmother   . Depression Maternal Grandfather   . Stroke Paternal Grandmother   . Cancer Paternal Grandfather   . Arthritis Sister        rheumatitis  . Thyroid disease Sister   . Hypertension Sister   . Cancer Sister        lung  . Depression Sister   . Breast cancer Paternal Aunt 22  . Kidney cancer Neg Hx   . Bladder Cancer Neg Hx     The following portions of the patient's history were reviewed and updated as appropriate: allergies, current medications, past family history, past medical history, past social history, past surgical history and problem list.  Review of Systems Review of Systems -comprehensive review of systems is negative except for that noted in the HPI  Objective:   BP (!) 170/84   Pulse 83   Ht 5\' 6"  (1.676 m)   Wt 180 lb 14.4 oz (82.1 kg)   BMI 29.20 kg/m  CONSTITUTIONAL: Well-developed, well-nourished female in no acute distress.  She ambulates with walker. HENT:  Normocephalic, atraumatic.  NECK: Not examined SKIN: Skin is warm and dry. No rash noted. Not diaphoretic. No erythema. No pallor. NEUROLGIC: Alert and oriented to person, place, and time. PSYCHIATRIC: Normal mood and affect. Normal behavior. Normal judgment and thought content. CARDIOVASCULAR:Not Examined RESPIRATORY: Not Examined BREASTS: Not Examined BACK: No CVA tenderness ABDOMEN: Soft, non distended; Non tender.  No Organomegaly. PELVIC:  External Genitalia: Normal  BUS: Normal  Vagina: Mild to moderate atrophy; second to third-degree cystocele; no rectocele; mucosa notable for an early abrasion measuring 5 x 7 mm posterior to the cervix, right-sided, friable  Cervix: Prolapsed to mid vagina; minimal secretions are noted; Pap smear is taken  Uterus: Normal size, shape,consistency, mobile  Adnexa: Nonpalpable nontender  RV:  Normal external exam  Bladder: Nontender MUSCULOSKELETAL: Normal range of motion. No tenderness.  No cyanosis, clubbing, or edema.  PROCEDURE: #3 ring with diaphragm support pessary is removed, cleaned, and not reinserted   Assessment:   1. Cervical cancer screening - IGP, [id number]/18  2. Urge incontinence of urine - POCT urinalysis dipstick - Urine Culture  3. Pessary maintenance; pessary is removed and not reinserted today because of posterior vaginal abrasion  4. Vaginal atrophy, stable  5. Vaginal abrasion, initial encounter, posterior to cervix right side of vagina, friable     Plan:   1.  Pap smear is done 2.  Pessary is removed and not reinserted 3.  Continue using Premarin cream 1/2 g intravaginal. 4.  Do not use Trimosan gel for the next 2 weeks 5.  Urinalysis is obtained and appears normal; urine culture is sent 6.  Return in 2 weeks for follow-up and probable pessary insertion  A total of 15 minutes were spent face-to-face with the patient during this encounter and over half of that time dealt with counseling and coordination of care.  , MD  Note: This dictation was prepared with Dragon dictation along with smaller phrase technology. Any transcriptional errors that result from this process are unintentional.

## 2017-10-27 LAB — IGP, COBASHPV16/18
HPV 16: NEGATIVE
HPV 18: NEGATIVE
HPV other hr types: NEGATIVE
PAP Smear Comment: 0

## 2017-10-27 LAB — URINE CULTURE

## 2017-10-30 ENCOUNTER — Telehealth: Payer: Self-pay | Admitting: Obstetrics and Gynecology

## 2017-10-30 ENCOUNTER — Other Ambulatory Visit: Payer: Self-pay | Admitting: Obstetrics and Gynecology

## 2017-10-30 MED ORDER — NITROFURANTOIN MONOHYD MACRO 100 MG PO CAPS: 100.0000 mg | ORAL_CAPSULE | Freq: Two times a day (BID) | ORAL | 0 refills | Status: DC

## 2017-10-30 NOTE — Telephone Encounter (Signed)
The patient stated she was calling the nurse back; requests a call back at [phone], please advise, thanks.

## 2017-10-31 NOTE — Telephone Encounter (Signed)
Pt aware of pos uti and meds erx.

## 2017-11-06 ENCOUNTER — Telehealth: Payer: Self-pay | Admitting: Obstetrics and Gynecology

## 2017-11-06 NOTE — Telephone Encounter (Signed)
Pt aware to keep her appt for in the am. Push fluids. Continue with atb as rxed.

## 2017-11-06 NOTE — Telephone Encounter (Signed)
The patient Amy Gregory at 1:44 PM today; is requesting for Crystal to call her back please at [phone], please advise, thanks.

## 2017-11-07 ENCOUNTER — Ambulatory Visit: Payer: BLUE CROSS/BLUE SHIELD | Admitting: Obstetrics and Gynecology

## 2017-11-07 ENCOUNTER — Encounter: Payer: Self-pay | Admitting: Obstetrics and Gynecology

## 2017-11-07 VITALS — BP 138/78 | HR 89 | Ht 66.0 in | Wt 181.8 lb

## 2017-11-07 DIAGNOSIS — N8111 Cystocele, midline: Secondary | ICD-10-CM

## 2017-11-07 DIAGNOSIS — R350 Frequency of micturition: Secondary | ICD-10-CM | POA: Diagnosis not present

## 2017-11-07 DIAGNOSIS — Z4689 Encounter for fitting and adjustment of other specified devices: Secondary | ICD-10-CM | POA: Diagnosis not present

## 2017-11-07 DIAGNOSIS — S30814D Abrasion of vagina and vulva, subsequent encounter: Secondary | ICD-10-CM

## 2017-11-07 MED ORDER — OXYQUINOLONE SULFATE 0.025 % VA GEL: 0.2500 g | VAGINAL | 3 refills | Status: DC

## 2017-11-07 NOTE — Patient Instructions (Signed)
1.  Pessary is inserted today 2.  Urine cultures obtained to be sure that UTI has been treated 3.  Return in 8 weeks for pessary maintenance 4.  Review of Pap smear demonstrates normal findings. 5.  Vaginal abrasion previously noted is resolved

## 2017-11-07 NOTE — Progress Notes (Signed)
Chief complaint: 1.  Follow-up on vaginal abrasion 2.  Pessary management 3.  Urinary frequency with incomplete bladder emptying  Amy Gregory presents today for 2-week follow-up.  Pessary was left out last visit because of a vaginal abrasion.  She was diagnosed with UTI and has since been treated with antibiotics.  She does note some persistent urinary frequency without dysuria.  This may be related to her incomplete bladder emptying from cystocele. Amy Gregory denies significant vaginal drainage or vaginal bleeding.  She is not experiencing any fever chills sweats or flank pain  Past Medical History:  Diagnosis Date  . Acquired left foot drop 02/22/2017  . Bilateral hearing loss   . Dysphagia   . GERD (gastroesophageal reflux disease)   . Hiatal hernia    small  . History of diverticulitis   . Impaired memory   . Nephrolithiasis   . OA (osteoarthritis)   . Osteoporosis 02/07/2017  . Rheumatoid arthritis (HCC)    managed by Dr. Gavin Potters   Past Surgical History:  Procedure Laterality Date  . ABDOMINAL HYSTERECTOMY  2002   for fibroids, non cancerous reasons, ovaries remain  . BREAST BIOPSY Left 2005-ish   benign  . CATARACT EXTRACTION  2014   OU  . COLONOSCOPY  04/26/04  . ESOPHAGEAL DILATION    . ESOPHAGOGASTRODUODENOSCOPY  02/01/05 and 07/13/14  . INTRAMEDULLARY (IM) NAIL INTERTROCHANTERIC Right 10/17/2016   Procedure: INTRAMEDULLARY (IM) NAIL INTERTROCHANTRIC;  Surgeon: Christena Flake, MD;  Location: [facility] ORS;  Service: Orthopedics;  Laterality: Right;  . KNEE SURGERY Right 03/2014   partial medial and lateral meniscectomy and medial condryle debridement  . LEG SURGERY  2013   fractured left leg, toes, hospitalized for 2 weeks  . ORIF TIBIA FRACTURE     remote ORIF proximal left tiba fracture   Review of systems: Complains of review of systems is negative except for that noted in the HPI  OBJECTIVE: BP 138/78   Pulse 89   Ht 5\' 6"  (1.676 m)   Wt 181 lb 12.8 oz (82.5 kg)   BMI  29.34 kg/m  Pleasant female in no acute distress, using a walker for getting around affect is appropriate. Abdomen: Soft, nontender without organomegaly Back: No CVA tenderness Pelvic exam: External genitalia-normal BUS-normal Vagina-mild to moderate atrophy; second to third-degree cystocele; no rectocele; previously noted vaginal abrasion is resolved (posterior to cervix) Cervix-prolapse to mid vagina; no significant discharge Uterus-not examined Adnexa-not examined Rectovaginal-normal external exam  PROCEDURE: #3 ring with diaphragm support pessary is inserted  ASSESSMENT: 1.  Vaginal abrasion, subsequent encounter, resolved 2.  Symptomatic cystocele with incomplete bladder emptying, controlled with pessary use, more symptomatic the past 2 weeks because of nonuse of pessary 3.  E. coli UTI, treated 4.  Vaginal atrophy, stable 5.  Urinary frequency likely due to nonuse of pessary over the past 2 weeks  PLAN: 1.  Insert pessary as noted 2.  Urine culture 3.  Continue using Trimosan gel intravaginal weekly 4.  Continue using Premarin cream intravaginal weekly 5.  Return in 8 weeks for pessary maintenance  A total of 15 minutes were spent face-to-face with the patient during this encounter and over half of that time dealt with counseling and coordination of care.   , MD  Note: This dictation was prepared with Dragon dictation along with smaller phrase technology. Any transcriptional errors that result from this process are unintentional.

## 2017-11-07 NOTE — Addendum Note (Signed)
Addended by: Marchelle Folks on: 11/07/2017 10:02 AM   Modules accepted: Orders

## 2017-12-26 ENCOUNTER — Telehealth: Payer: Self-pay | Admitting: Obstetrics and Gynecology

## 2017-12-26 NOTE — Telephone Encounter (Signed)
The patient called and stated that she would like to speak with her provider in regards to her insurance not covering her apt's because of way the apt is coded. Please advise.

## 2017-12-26 NOTE — Telephone Encounter (Signed)
Will send to [facility].

## 2018-01-02 ENCOUNTER — Encounter: Payer: BLUE CROSS/BLUE SHIELD | Admitting: Obstetrics and Gynecology

## 2018-01-23 ENCOUNTER — Ambulatory Visit: Payer: BLUE CROSS/BLUE SHIELD | Admitting: Family Medicine

## 2018-01-30 ENCOUNTER — Encounter: Payer: BLUE CROSS/BLUE SHIELD | Admitting: Obstetrics and Gynecology

## 2018-02-13 ENCOUNTER — Encounter: Payer: BLUE CROSS/BLUE SHIELD | Admitting: Obstetrics and Gynecology

## 2018-02-23 IMAGING — US US EXTREM LOW VENOUS*R*
1 series · 13 of 24 positions shown · non-contrast
Comparison: None.

CLINICAL DATA: Acute onset of right calf pain, swelling and
tenderness, status post right knee replacement. Initial encounter.



[Series 1: us extrem low venous*right* · 0.09mm/px · 13 of 34 slices shown]
[im 1/34]
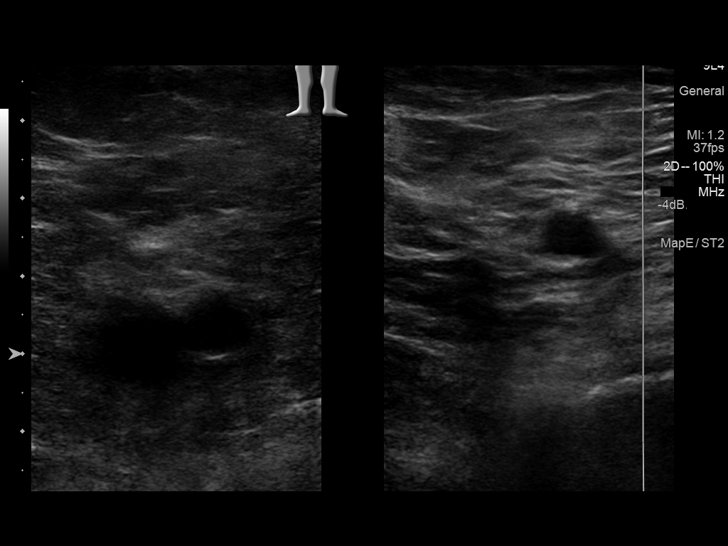
[im 3/34]
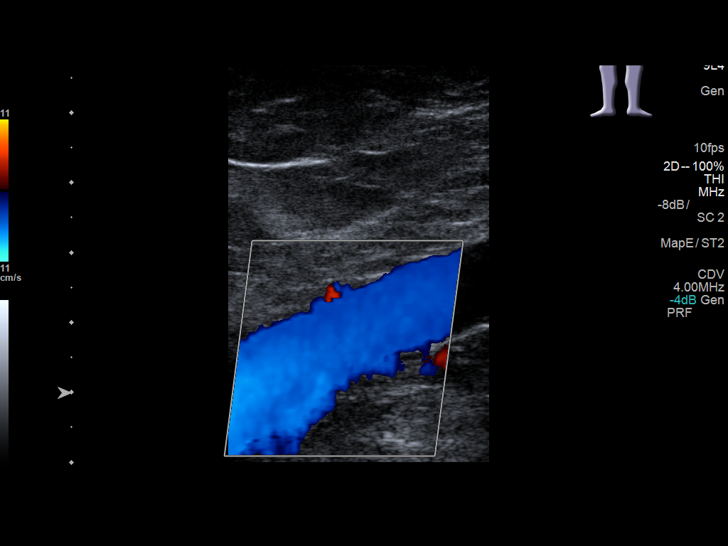
[im 6/34]
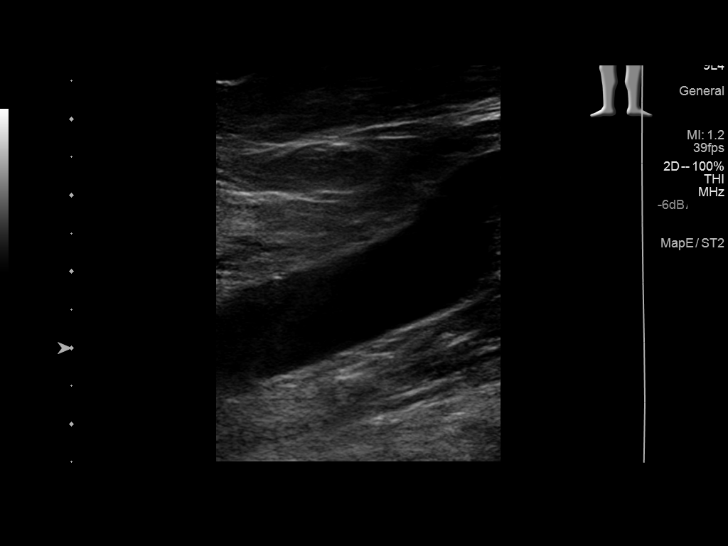
[im 9/34]
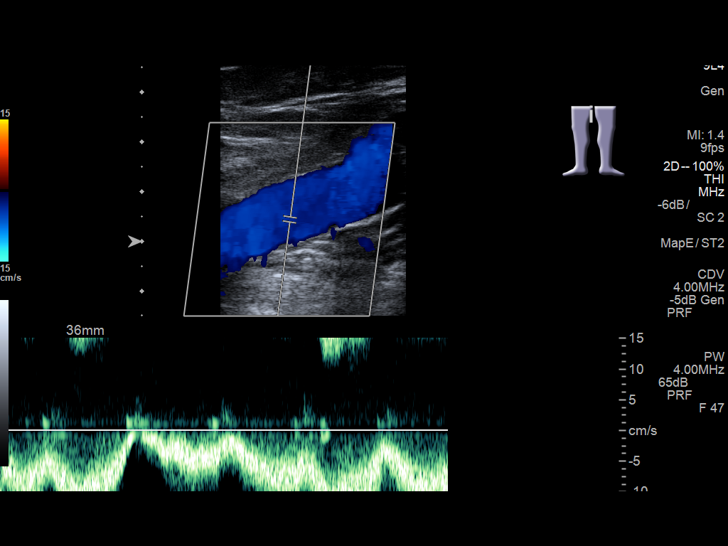
[im 12/34]
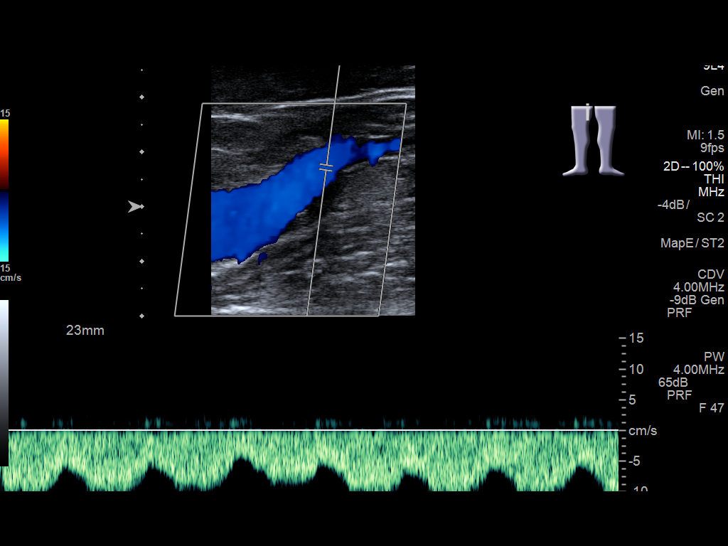
[im 15/34]
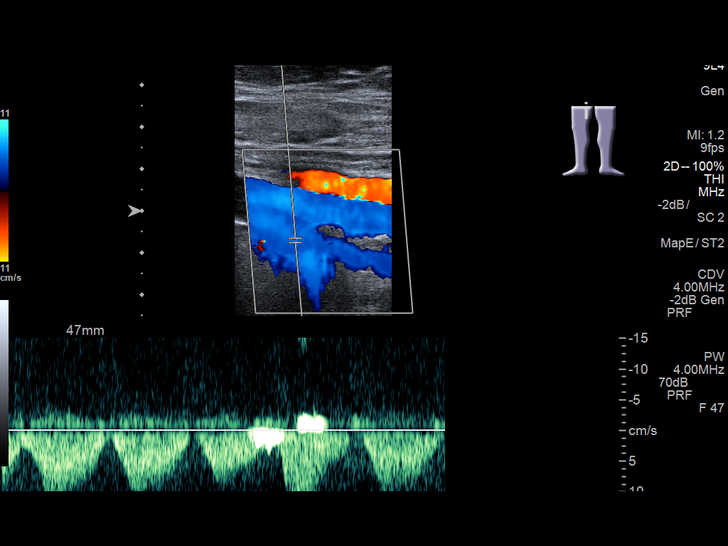
[im 18/34]
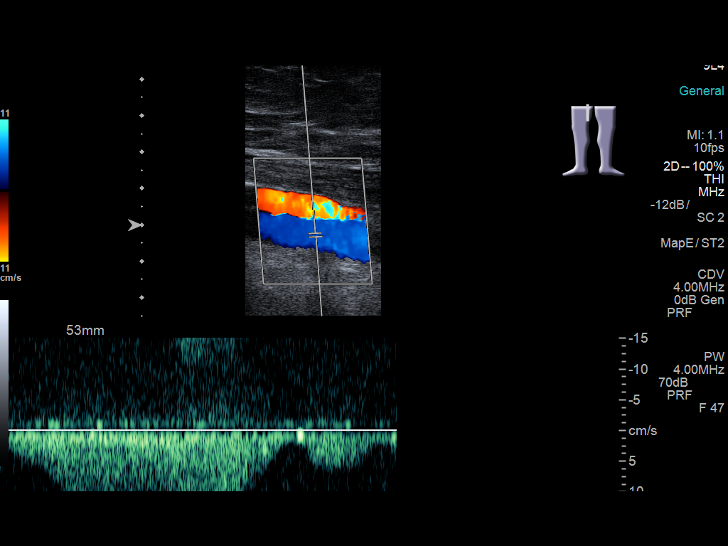
[im 19/34]
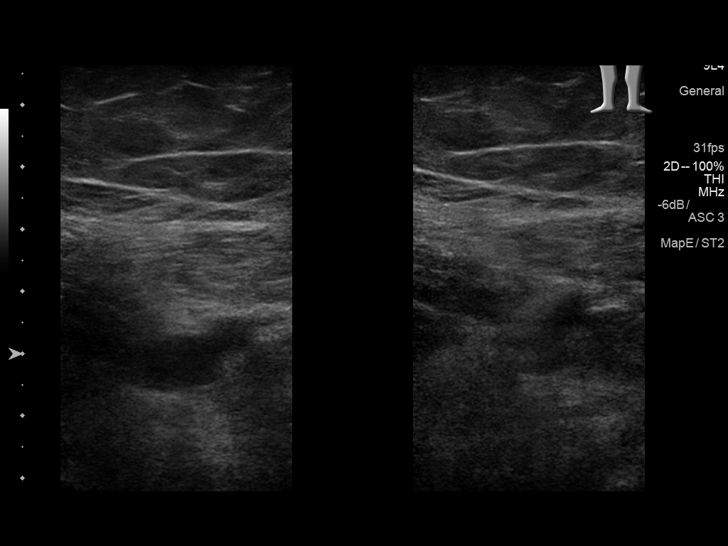
[im 22/34]
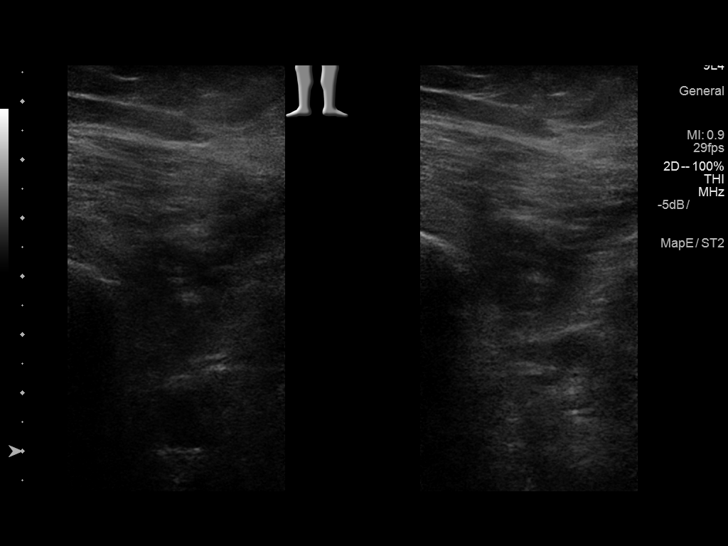
[im 25/34]
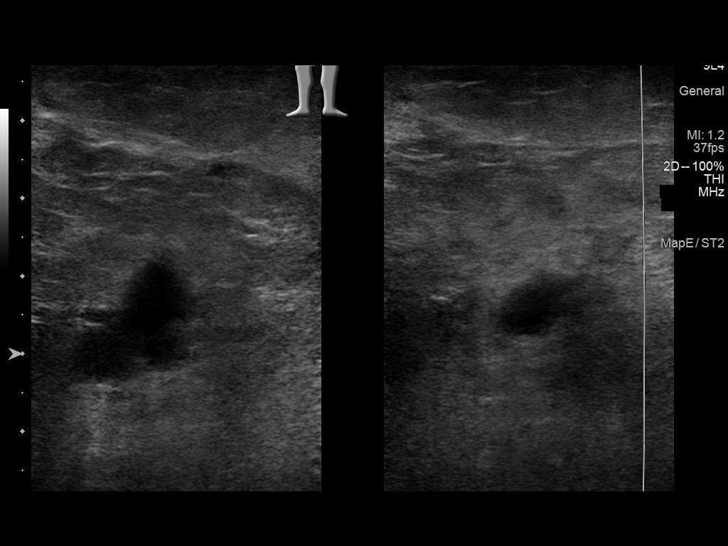
[im 28/34]
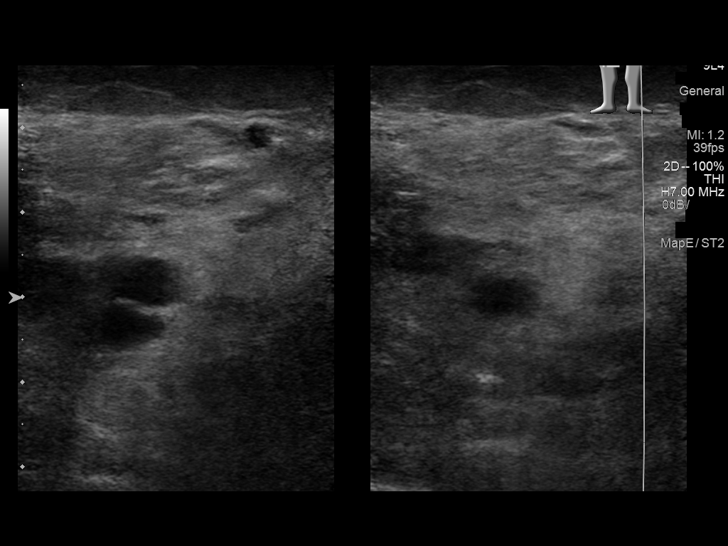
[im 31/34]
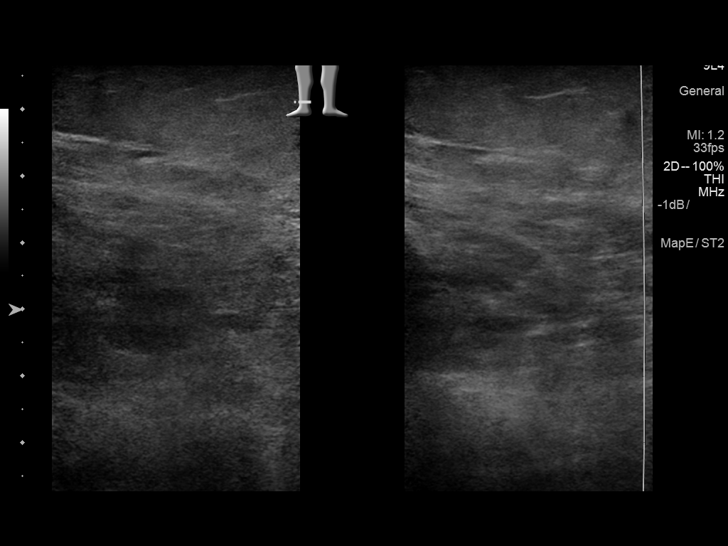
[im 34/34]
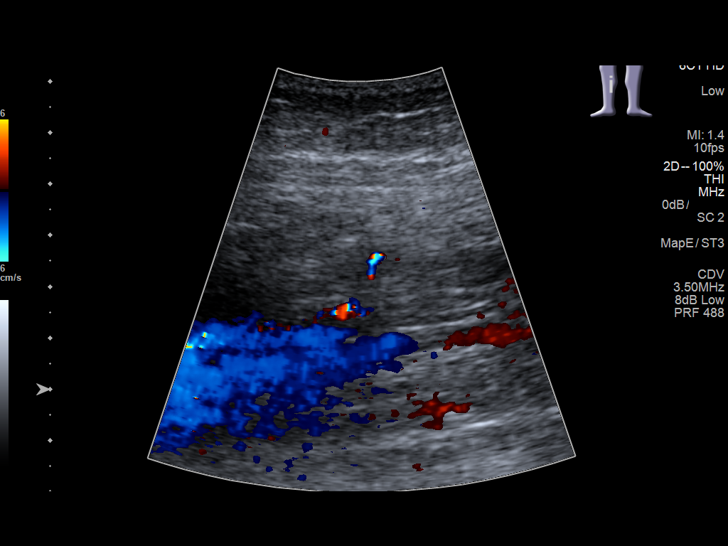

[13 of 24 positions shown; findings below may reference images not displayed]

FINDINGS: Contralateral Common Femoral Vein: Respiratory phasicity is normal
and symmetric with the symptomatic side. No evidence of thrombus.
Normal compressibility.

Common Femoral Vein: No evidence of thrombus. Normal
compressibility, respiratory phasicity and response to augmentation.

Saphenofemoral Junction: No evidence of thrombus. Normal
compressibility and flow on color Doppler imaging.

Profunda Femoral Vein: No evidence of thrombus. Normal
compressibility and flow on color Doppler imaging.

Femoral Vein: No evidence of thrombus. Normal compressibility,
respiratory phasicity and response to augmentation.

Popliteal Vein: No evidence of thrombus. Normal compressibility,
respiratory phasicity and response to augmentation.

Calf Veins: No evidence of thrombus. Normal compressibility and flow
on color Doppler imaging.

Superficial Great Saphenous Vein: No evidence of thrombus. Normal
compressibility and flow on color Doppler imaging.

Venous Reflux:  None.

Other Findings:  None.
IMPRESSION: No evidence of deep venous thrombosis.

## 2018-06-17 ENCOUNTER — Ambulatory Visit: Payer: BLUE CROSS/BLUE SHIELD | Admitting: Urology

## 2018-07-30 ENCOUNTER — Telehealth: Payer: Self-pay | Admitting: Family Medicine

## 2018-07-30 NOTE — Telephone Encounter (Signed)
Copied from CRM [id number]. Topic: Quick Communication - See Telephone Encounter >> Jul 30, 2018 12:08 PM Angela Nevin wrote: CRM for notification. See Telephone encounter for: 07/30/18.   Patient called with complaints of frequent urination and a burning sensation. She would like to know if Dr. Sherie Don would be willing to send in a medication for UTI to pharmacy without OV as she does not want to leave the house right now. Please advise.

## 2018-07-31 ENCOUNTER — Encounter: Payer: Self-pay | Admitting: Nurse Practitioner

## 2018-07-31 ENCOUNTER — Telehealth (INDEPENDENT_AMBULATORY_CARE_PROVIDER_SITE_OTHER): Payer: BLUE CROSS/BLUE SHIELD | Admitting: Nurse Practitioner

## 2018-07-31 DIAGNOSIS — R300 Dysuria: Secondary | ICD-10-CM

## 2018-07-31 DIAGNOSIS — I10 Essential (primary) hypertension: Secondary | ICD-10-CM

## 2018-07-31 MED ORDER — NITROFURANTOIN MONOHYD MACRO 100 MG PO CAPS: 100.0000 mg | ORAL_CAPSULE | Freq: Two times a day (BID) | ORAL | 0 refills | Status: DC

## 2018-07-31 MED ORDER — AMLODIPINE BESYLATE 5 MG PO TABS: 5.0000 mg | ORAL_TABLET | Freq: Every day | ORAL | 0 refills | Status: DC

## 2018-07-31 NOTE — Progress Notes (Signed)
Sign when Signing Visit         Show:Clear all [x] Manual[x] Template[] Copied  Added by: [x] Kadon Andrus, Percell Belt, NP  [] Hover for details Virtual Visit via Telephone Note  I connected withWillie Joan Gregory on 07/31/18 at  2:40 PM EDT by telephoneand verified that I am speaking with the correct person using two identifiers.  I discussed the limitations, risks, security and privacy concerns of performing an evaluation and management service by telephone and the availability of in person appointments. I also discussed with the patient that there may be a patient responsible charge related to this service. The patient expressed understanding and agreed to proceed.   History of Present Illness: UTI symptoms Dysuria and urinary frequency  3 days ago. No nausea, vomiting, blood in urine, Flank pain, fevers, chills Has been drinking lots of water Feels same as when she had UTI Last GFR 110 on 07/23/2017  Hypertension Taking amlodipine 5mg  with no missed 130/80 range at home Denies headaches, blurry vision or dizziness  Observations/Objective: Patient is hard of hearing Alert, oriented  Assessment and Plan: 1. Dysuria Hydration, ER precautions and ROC discussed - nitrofurantoin, macrocrystal-monohydrate, (MACROBID) 100 MG capsule; Take 1 capsule (100 mg total) by mouth 2 (two) times daily.  Dispense: 14 capsule; Refill: 0  2. Essential hypertension, benign Stable continue meds, 3 month follow-up for repeat labs once pandemic has lessened - amLODipine (NORVASC) 5 MG tablet; Take 1 tablet (5 mg total) by mouth daily.  Dispense: 90 tablet; Refill: 0   Follow Up Instructions: Follow-up in 3 months   I discussed the assessment and treatment plan with the patient. The patient was provided an opportunity to ask questions and all were answered. The patient agreed with the plan and demonstrated an understanding of the instructions.  The patient was advised to call back or seek  an in-person evaluation if the symptoms worsen or if the condition fails to improve as anticipated.  I provided 11 minutes of non-face-to-face time during this encounter.   Cheryle Horsfall, NP

## 2018-07-31 NOTE — H&P (Signed)
Virtual Visit via Telephone Note  I connected with Amy Gregory on 07/31/18 at  2:40 PM EDT by telephone and verified that I am speaking with the correct person using two identifiers.   I discussed the limitations, risks, security and privacy concerns of performing an evaluation and management service by telephone and the availability of in person appointments. I also discussed with the patient that there may be a patient responsible charge related to this service. The patient expressed understanding and agreed to proceed.   History of Present Illness: UTI symptoms Dysuria and urinary frequency  3 days ago. No nausea, vomiting, blood in urine, Flank pain, fevers, chills Has been drinking lots of water Feels same as when she had UTI  Last GFR 110 on 07/23/2017  Hypertension Taking amlodipine 5mg  with no missed 130/80 range at home Denies headaches, blurry vision or dizziness  Observations/Objective: Patient is hard of hearing Alert, oriented  Assessment and Plan: 1. Dysuria Hydration, ER precautions and ROC discussed - nitrofurantoin, macrocrystal-monohydrate, (MACROBID) 100 MG capsule; Take 1 capsule (100 mg total) by mouth 2 (two) times daily.  Dispense: 14 capsule; Refill: 0  2. Essential hypertension, benign Stable continue meds, 3 month follow-up for repeat labs once pandemic has lessened - amLODipine (NORVASC) 5 MG tablet; Take 1 tablet (5 mg total) by mouth daily.  Dispense: 90 tablet; Refill: 0   Follow Up Instructions: Follow-up in 3 months    I discussed the assessment and treatment plan with the patient. The patient was provided an opportunity to ask questions and all were answered. The patient agreed with the plan and demonstrated an understanding of the instructions.   The patient was advised to call back or seek an in-person evaluation if the symptoms worsen or if the condition fails to improve as anticipated.  I provided 11 minutes of non-face-to-face time  during this encounter.   Cheryle Horsfall, NP

## 2018-07-31 NOTE — Telephone Encounter (Signed)
Pt notified and scheduled for virtual visit

## 2019-02-28 ENCOUNTER — Telehealth: Payer: Self-pay | Admitting: Family Medicine

## 2019-02-28 DIAGNOSIS — G1221 Amyotrophic lateral sclerosis: Secondary | ICD-10-CM

## 2019-02-28 NOTE — Telephone Encounter (Signed)
Copied from [place] [phone]. Topic: General - Other >> Feb 28, 2019  4:30 PM Keene Breath wrote: Reason for CRM: Called to request a stat hospice order for the patient as soon as possible.  CB# [phone]

## 2019-03-03 DIAGNOSIS — G1221 Amyotrophic lateral sclerosis: Secondary | ICD-10-CM | POA: Insufficient documentation

## 2019-03-03 NOTE — Telephone Encounter (Signed)
Routing to [facility].

## 2019-03-03 NOTE — Telephone Encounter (Signed)
Christina calling back to check status.

## 2019-03-03 NOTE — Telephone Encounter (Signed)
Pt daughter in law Nira Conn)  calling back to check status. Pt is with daughter in law and would like a call back as soon as possible.    GQ#[phone]

## 2019-03-04 NOTE — Telephone Encounter (Signed)
Talked to DIL referral placed.  [facility] processing referral.  Spoke with nurse Margreta Journey at [facility]

## 2019-05-30 ENCOUNTER — Telehealth: Payer: Self-pay | Admitting: Primary Care

## 2019-05-30 NOTE — Telephone Encounter (Signed)
Called patient's cell and home number to schedule the Palliative Consult with no answer - left message at both numbers with reason for the call along with my contact information.

## 2019-05-30 NOTE — Telephone Encounter (Signed)
Rec'd call back from husband Freida Busman and after discussing Palliative services with him he was in agreement with this.  I have scheduled a Telephone Consult for 06/06/19 @ 2 PM

## 2019-06-06 ENCOUNTER — Other Ambulatory Visit: Payer: BLUE CROSS/BLUE SHIELD | Admitting: Primary Care

## 2019-06-06 ENCOUNTER — Other Ambulatory Visit: Payer: Self-pay

## 2019-06-06 DIAGNOSIS — Z515 Encounter for palliative care: Secondary | ICD-10-CM

## 2019-06-06 NOTE — Progress Notes (Signed)
[facility] Consult Note Telephone: [phone]  Fax: [phone]    PATIENT NAME: Amy Gregory [address] [place] [address] [phone] (home)  DOB: October 31, 1955 MRN: [id number]  PRIMARY CARE PROVIDER:  Arnetha Courser, [address],  [place] [address]  REFERRING PROVIDER: Franchot Mimes, [place],  [place] [address]  RESPONSIBLE PARTY:   Extended Emergency Contact Information Primary Emergency Contact: Betti, [place] Address: [address]          [place], [place] [address] Johnnette Litter of [place] Phone: [phone] Mobile Phone: [phone] Relation: Spouse    I met with Amy Gregory and her son Gerald Stabs in their home.  ASSESSMENT AND RECOMMENDATIOINS:   1. Advance Care Planning/Goals of Care: Goals include to maximize quality of life and symptom management. We discussed advance directives and they don't have a living will or last will. Gave Five Wishes booklet. I explained and left for her to review. Left MOST for her to review, we will discuss on next visit. We reviewed medications.   2. 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 Cannot walk or bear weight. Lost use of legs 4-5 months ago.Is in an electric tilt chair and gets up with hoyer lift. Has home care coming to help to get a bath, and get up. Thru an agency. Has a daughter in law who also was helping. Does not have long term care insurance. Recently changed health carrieres.  Mobility: chair bound and uses hoyer. Can do some adls, but not many. Can feed self, do some gross motor movement with hands.  Pain: in left arm, states it hurts all the time. Not sure if it's related to ALS. We discussed increasing gabapentin to see if this helped.  Has 200 mg gabapentin at hs. Did work for a while, several weeks. This helped leg pain. We discussed these sx as pain and restlessness. We discussed increasing gabapentin and /or requip as  directed by ALS clinic.  Resources: Gave her  elder care # for resource, for caregiving, briefs. She is paying for care presently and has some family help and church members help.   3. Family /Caregiver/Community Supports: Lives in own home with family . Husband works during day. Caregivers recently hired. Church is a big support for meals and some caregiving.  4. Cognitive / Functional decline: Alert &  Oriented x3. Increasing loss of motor function.  5. Follow up Palliative Care Visit: Palliative care will continue to follow for goals of care clarification and symptom management. Return 6-8 weeks or prn.  I spent 60 minutes providing this consultation,  from 1430 to 1530. More than 50% of the time in this consultation was spent coordinating communication.   HISTORY OF PRESENT ILLNESS:  Amy Gregory is a 64 y.o. year old female with multiple medical problems including ALS, OA, RA. Palliative Care was asked to follow this patient by consultation request of Franchot Mimes, MD to help address advance care planning and goals of care. This is the initial visit.  CODE STATUS: TBD  PPS: 40% HOSPICE ELIGIBILITY/DIAGNOSIS: TBD  PAST MEDICAL HISTORY:  Past Medical History:  Diagnosis Date  . Acquired left foot drop 02/22/2017  . Bilateral hearing loss   . Dysphagia   . GERD (gastroesophageal reflux disease)   . Hiatal hernia    small  . History of diverticulitis   . Impaired memory   . Nephrolithiasis   .  OA (osteoarthritis)   . Osteoporosis 02/07/2017  . Rheumatoid arthritis ([place])    managed by Dr. Jefm Bryant    SOCIAL HX:  Social History   Tobacco Use  . Smoking status: Former Smoker    Packs/day: 1.00    Years: 3.00    Pack years: 3.00    Types: Cigarettes    Start date: 05/08/1966    Quit date: 05/08/1969    Years since quitting: 50.1  . Smokeless tobacco: Never Used  Substance Use Topics  . Alcohol use: No    Alcohol/week: 0.0 standard drinks     ALLERGIES:    Allergies  Allergen Reactions  . Sulfa Antibiotics   . Cefdinir Rash  . Oxycodone Rash    PER PATIENT ON 7.25.18     PERTINENT MEDICATIONS:  Outpatient Encounter Medications as of 06/06/2019  Medication Sig  . Ascorbic Acid (VITAMIN C) 100 MG tablet Take 100 mg by mouth daily.  . cholecalciferol (VITAMIN D) 1000 units tablet Take 1,000 Units by mouth daily.  . Denosumab (PROLIA [place]) Inject 1 each into the skin every 6 (six) months. Injection every 6 months  . folic acid (FOLVITE) 1 MG tablet TAKE 1 TABLET BY MOUTH ONCE DAILY  . gabapentin (NEURONTIN) 100 MG capsule Take 200 mg by mouth at bedtime.  . methotrexate (RHEUMATREX) 2.5 MG tablet Take 15 mg by mouth once a week. Take 6  2.'5mg'$  pills once a week.  . Multiple Vitamin (MULTIVITAMIN) capsule Take by mouth.  . pantoprazole (PROTONIX) 40 MG tablet TAKE 1 TABLET BY MOUTH ONCE DAILY.  Marland Kitchen conjugated estrogens (PREMARIN) vaginal cream Place 1 Applicatorful vaginally daily. Apply 0.'5mg'$  (pea-sized amount)  just inside the vaginal introitus with a finger-tip every night for two weeks and then Monday, Wednesday and Friday nights. (Patient not taking: Reported on 06/06/2019)  . OXYQUINOLONE SULFATE VAGINAL (TRIMO-SAN) 0.025 % GEL Place 0.25 g vaginally once a week. (Patient not taking: Reported on 07/31/2018)  . [DISCONTINUED] amLODipine (NORVASC) 5 MG tablet Take 1 tablet (5 mg total) by mouth daily. (Patient not taking: Reported on 06/06/2019)  . [DISCONTINUED] Calcium-Phosphorus-Vitamin D (CALCIUM GUMMIES PO) Take 2 each by mouth daily.  . [DISCONTINUED] diphenhydramine-acetaminophen (TYLENOL PM) 25-500 MG TABS tablet Take by mouth.  . [DISCONTINUED] fluticasone (FLONASE) 50 MCG/ACT nasal spray Place 2 sprays into both nostrils daily. (Patient not taking: Reported on 07/31/2018)  . [DISCONTINUED] nitrofurantoin, macrocrystal-monohydrate, (MACROBID) 100 MG capsule Take 1 capsule (100 mg total) by mouth 2 (two) times daily. (Patient not taking:  Reported on 06/06/2019)  . [DISCONTINUED] Trospium Chloride 60 MG CP24 Take 1 capsule (60 mg total) by mouth daily.   No facility-administered encounter medications on file as of 06/06/2019.     PHYSICAL EXAM / ROS:   Current and past weights: 170-180 lbs General: NAD, frail appearing, obese Cardiovascular: no chest pain reported,+2 edema bil Pulmonary: no cough, no increased SOB, Room air Abdomen: appetite good, denies constipation, incontinent of bowel GU: denies dysuria, incontinent of urine has adult briefs.  MSK:  Hand RA joint deformities, non ambulatory, lifted by hoyer, in tilt chair Skin: no rashes or wounds reported Neurological: Weakness, ALS manifestations, not able to stand.Taking Curmin tablets  Jason Coop, NP [facility]  COVID-19 PATIENT SCREENING TOOL  Person answering questions: _______Ms Oldham____________ _____   1.  Is the patient or any family member in the home showing any signs or symptoms regarding respiratory infection?  Person with Symptom- ________NA___________________  a. Fever                                                                          Yes___ No___          ___________________  b. Shortness of breath                                                    Yes___ No___          ___________________ c. Cough/congestion                                       Yes___  No___         ___________________ d. Body aches/pains                                                         Yes___ No___        ____________________ e. Gastrointestinal symptoms (diarrhea, nausea)           Yes___ No___        ____________________  2. Within the past 14 days, has anyone living in the home had any contact with someone with or under investigation for COVID-19?    Yes___ No_x   Person __________________

## 2019-07-24 ENCOUNTER — Other Ambulatory Visit: Payer: Self-pay

## 2019-07-24 ENCOUNTER — Other Ambulatory Visit: Payer: Medicare HMO | Admitting: Primary Care

## 2019-07-24 DIAGNOSIS — Z515 Encounter for palliative care: Secondary | ICD-10-CM

## 2019-07-24 NOTE — Progress Notes (Signed)
[facility] Consult Note Telephone: [phone]  Fax: [phone]    PATIENT NAME: Amy Gregory [address] [place] [address] [phone] (home)  DOB: 08-24-1955 MRN: [id number]  PRIMARY CARE PROVIDER:  Arnetha Courser, MD [phone]  REFERRING PROVIDER: Franchot Mimes, [place],  [place] [address]   RESPONSIBLE PARTY:   Extended Emergency Contact Information Primary Emergency Contact: Verneal, Wiers Address: [address]          [place],  [address] Johnnette Litter of [place] Phone: [phone] Mobile Phone: [phone] Relation: Spouse   ASSESSMENT AND RECOMMENDATIONS:   1. Advance Care Planning/Goals of Care: Goals include to maximize quality of life and symptom management. We discussed QOL at length In the context of choosing a feeding tube before she becomes too much of a surgical risk. She is very much on the fence. We discussed disease course as well as terminal process. We also touched on use of ventilator and cpap which she currently uses at least 4 hrs a day. I encouraged her to use more  to help chronic hypoxia. We discussed MOST again which she will again consider when she discusses these interventions with family. I also recommended ALS society to help with information and support.   2. Symptom Management:   ALS progression: States good weights but oxygen has decreased to 56%. Oxygen at hs, has cpap for hs.  Sees her UA are weaker. States legs are now unable to work and could in December. On Theracurmin hp per neurology clinic. Discussed feeding tube which MD had asked to discuss. Encouraged to use SW at ALS. Patient states she was told palliative would take over for ALS clinic. She realizes she needs to know best and worst case for g tube placement. States elective g tube placement is backed up several months. We discussed getting it and not using it but she's reticent. States in Dec it was  47, in March it was 74. States no problem sitting up (core strength). We also discussed possible ventilator use eventually.  Pain: denies pain but occ muscle cramps. States increased gabapentin at hs. Can increase dose but is not fond of the drowsiness.  Mobility: Has hoyer lift in and out of chair and bed. Endorses loss of UA strength and mobility and total loss of LE use.  3. Family /Caregiver/Community Supports: Lives with son and husband, DIL helps with ADLs  4. Cognitive / Functional decline: A and O x 3. Losing physical function and needs help with all adls.  5. Follow up Palliative Care Visit: Palliative care will continue to follow for goals of care clarification and symptom management. Return 3 weeks or prn.  I spent 60 minutes providing this consultation,  from 1400 to 1500. More than 50% of the time in this consultation was spent coordinating communication.   HISTORY OF PRESENT ILLNESS:  Amy Gregory is a 64 y.o. year old female with multiple medical problems including ALS, OA, RA. Palliative Care was asked to follow this patient by consultation request of Franchot Mimes, MD  to help address advance care planning and goals of care. This is a follow up visit.  CODE STATUS: TBD, most left  PPS: 30% HOSPICE ELIGIBILITY/DIAGNOSIS: TBD  PAST MEDICAL HISTORY:  Past Medical History:  Diagnosis Date  . Acquired left foot drop 02/22/2017  . Bilateral hearing loss   . Dysphagia   . GERD (gastroesophageal reflux disease)   . Hiatal hernia  small  . History of diverticulitis   . Impaired memory   . Nephrolithiasis   . OA (osteoarthritis)   . Osteoporosis 02/07/2017  . Rheumatoid arthritis ([facility])    managed by Dr. Gavin Potters    SOCIAL HX:  Social History   Tobacco Use  . Smoking status: Former Smoker    Packs/day: 1.00    Years: 3.00    Pack years: 3.00    Types: Cigarettes    Start date: 05/08/1966    Quit date: 05/08/1969    Years since quitting: 50.2  . Smokeless  tobacco: Never Used  Substance Use Topics  . Alcohol use: No    Alcohol/week: 0.0 standard drinks    ALLERGIES:  Allergies  Allergen Reactions  . Sulfa Antibiotics   . Cefdinir Rash  . Oxycodone Rash    PER PATIENT ON 7.25.18     PERTINENT MEDICATIONS:  Outpatient Encounter Medications as of 07/24/2019  Medication Sig  . Ascorbic Acid (VITAMIN C) 100 MG tablet Take 100 mg by mouth daily.  . cholecalciferol (VITAMIN D) 1000 units tablet Take 1,000 Units by mouth daily.  Marland Kitchen conjugated estrogens (PREMARIN) vaginal cream Place 1 Applicatorful vaginally daily. Apply 0.5mg  (pea-sized amount)  just inside the vaginal introitus with a finger-tip every night for two weeks and then Monday, Wednesday and Friday nights. (Patient not taking: Reported on 06/06/2019)  . Denosumab (PROLIA [place]) Inject 1 each into the skin every 6 (six) months. Injection every 6 months  . folic acid (FOLVITE) 1 MG tablet TAKE 1 TABLET BY MOUTH ONCE DAILY  . gabapentin (NEURONTIN) 100 MG capsule Take 200 mg by mouth at bedtime.  . methotrexate (RHEUMATREX) 2.5 MG tablet Take 15 mg by mouth once a week. Take 6  2.5mg  pills once a week.  . Multiple Vitamin (MULTIVITAMIN) capsule Take by mouth.  Dani Gobble SULFATE VAGINAL (TRIMO-SAN) 0.025 % GEL Place 0.25 g vaginally once a week. (Patient not taking: Reported on 07/31/2018)  . pantoprazole (PROTONIX) 40 MG tablet TAKE 1 TABLET BY MOUTH ONCE DAILY.   No facility-administered encounter medications on file as of 07/24/2019.     PHYSICAL EXAM / ROS:   Current and past weights: 187 lbs. gained General: NAD, frail appearing, WNWD HEENT: uses glasses, occ dysphagia.  Cardiovascular: no chest pain reported, 3+edema when sitting  Pulmonary: no cough, no increased SOB, room air Abdomen: appetite fair, endorses constipation, incontinent of bowel GU: denies dysuria, incontinent of urine MSK:   non-ambulatory, in scooter, denies falls Skin: no rashes or wounds  reported Neurological: Weakness, insomnia, spasms, denies pain, progressing neuromuscular function loss.   Eliezer Lofts, NP [facility]  COVID-19 PATIENT SCREENING TOOL  Person answering questions: ____________[name]____ _____   1.  Is the patient or any family member in the home showing any signs or symptoms regarding respiratory infection?               Person with Symptom- __________NA_________________  a. Fever                                                                          Yes___ No___          ___________________  b. Shortness of breath  Yes___ No___          ___________________ c. Cough/congestion                                       Yes___  No___         ___________________ d. Body aches/pains                                                         Yes___ No___        ____________________ e. Gastrointestinal symptoms (diarrhea, nausea)           Yes___ No___        ____________________  2. Within the past 14 days, has anyone living in the home had any contact with someone with or under investigation for COVID-19?    Yes___ No_X_   Person __________________

## 2019-08-05 ENCOUNTER — Telehealth: Payer: Self-pay

## 2019-08-14 ENCOUNTER — Other Ambulatory Visit: Payer: Self-pay

## 2019-08-14 ENCOUNTER — Telehealth: Payer: Self-pay | Admitting: Primary Care

## 2019-08-14 ENCOUNTER — Other Ambulatory Visit: Payer: Medicare HMO | Admitting: Primary Care

## 2019-08-14 NOTE — Telephone Encounter (Signed)
Called home and cell in advance of home visit. Apparently is at [facility] today for appts. Will reschedule.

## 2019-08-19 NOTE — Telephone Encounter (Signed)
SW made multiple attempts to contact patient/family. SW left VM with contact information.

## 2019-08-26 ENCOUNTER — Telehealth: Payer: Self-pay | Admitting: Primary Care

## 2019-08-26 NOTE — Telephone Encounter (Signed)
T/c to reschedule missed palliative appt from several weeks ago. Message left.

## 2019-08-29 ENCOUNTER — Other Ambulatory Visit: Payer: Medicare HMO | Admitting: Primary Care

## 2019-09-01 ENCOUNTER — Other Ambulatory Visit: Payer: Medicare HMO | Admitting: Primary Care

## 2019-09-01 ENCOUNTER — Other Ambulatory Visit: Payer: Self-pay

## 2019-09-01 DIAGNOSIS — Z515 Encounter for palliative care: Secondary | ICD-10-CM

## 2019-09-01 NOTE — Progress Notes (Signed)
[facility] Consult Note Telephone: [phone]  Fax: [phone]  PATIENT NAME: Amy Gregory [address] [place] [address] [phone] (home)  DOB: [birth year] MRN: [id number]  PRIMARY CARE PROVIDER:    Arnetha Courser, MD,   REFERRING PROVIDER:   Franchot Mimes, [place],  [place] [address]  [phone]  RESPONSIBLE PARTY:   Extended Emergency Contact Information Primary Emergency Contact: Maura Crandall Address: [address]          [place], [place] [address] Johnnette Litter of [place] Phone: [phone] Mobile Phone: [phone] Relation: Spouse  I met with patient and family in the home / facility.  ASSESSMENT AND RECOMMENDATIONS:   1. Advance Care Planning/Goals of Care: Goals include to maximize quality of life and symptom management. I met with Amy Gregory  her home with her husband present. I had not met him before. She  made the appointment with [place] last week.   Advance care planning:  She asked for some clarification as to the role of palliative care. We discussed the role of the community palliative care practitioner in tandem with her ALS clinic providers. We discussed the indication  for hospice in that she asked whose decision this was. I outlined the decision was made with a discussion of a combination of her medical team and also her goals of care being  to stop any offered treatments. She voiced understanding and said she did not feel she was at that point,  which I concur. She states she has not made up her mind about a feeding tube placement yet.   I explained that my role with community-based as opposed to hospital-based palliative was that I could be a resource for her in the community. I let her know about a grant program that would help pay for some in-home care providers as her son and husband are overwhelmed with her care. At the end of our visit she decided that she will continue at the [facility] for now and will call back when she wants another palliative visit. For now I will not schedule any more follow up visits  but she may call at any time and schedule. She did acknowledge that in-home visits are more productive than telephone visits due to her speech decline. I called Nickie Retort, Social Worker at the [facility] to apprise her of the patien'ts decision to place palliative services on hold. We are happy to resume at any point.  2. Symptom Management: No new complaints  3. Family /Caregiver/Community Supports:  [place] and [place] Patient of [facility].  4. Cognitive / Functional decline: A and O x 3. Dependent on power chair for mobility. Unable to move LE. Dependent in all ialds and most all adls.  5. Follow up Palliative Care Visit: Palliative care will continue to follow for goals of care clarification and symptom management. Return  prn.  I spent 20 minutes providing this consultation,  from 1545 to 1605. More than 50% of the time in this consultation was spent coordinating communication.   HISTORY OF PRESENT ILLNESS:  Amy Gregory is a 64 y.o. year old female with multiple medical problems including ALS,immobility. Palliative Care was asked to follow this patient by consultation request of Franchot Mimes, MD to help address advance care planning and goals of care. This is a follow up visit.  CODE STATUS: FULL  PPS: 30% HOSPICE ELIGIBILITY/DIAGNOSIS: TBD  PAST MEDICAL HISTORY:  Past Medical History:  Diagnosis Date  . Acquired left foot drop 02/22/2017  . Bilateral hearing loss   . Dysphagia   . GERD (gastroesophageal reflux disease)   . Hiatal hernia    small  . History of diverticulitis   . Impaired memory   . Nephrolithiasis   . OA (osteoarthritis)   . Osteoporosis 02/07/2017  . Rheumatoid arthritis ([place])    managed by Dr. Jefm Bryant    SOCIAL HX:  Social History   Tobacco Use  . Smoking status:  Former Smoker    Packs/day: 1.00    Years: 3.00    Pack years: 3.00    Types: Cigarettes    Start date: 05/08/1966    Quit date: 05/08/1969    Years since quitting: 50.3  . Smokeless tobacco: Never Used  Substance Use Topics  . Alcohol use: No    Alcohol/week: 0.0 standard drinks    ALLERGIES:  Allergies  Allergen Reactions  . Sulfa Antibiotics   . Cefdinir Rash  . Oxycodone Rash    PER PATIENT ON 7.25.18     PERTINENT MEDICATIONS:  Outpatient Encounter Medications as of 09/01/2019  Medication Sig  . Ascorbic Acid (VITAMIN C) 100 MG tablet Take 100 mg by mouth daily.  . cholecalciferol (VITAMIN D) 1000 units tablet Take 1,000 Units by mouth daily.  Marland Kitchen conjugated estrogens (PREMARIN) vaginal cream Place 1 Applicatorful vaginally daily. Apply 0.63m (pea-sized amount)  just inside the vaginal introitus with a finger-tip every night for two weeks and then Monday, Wednesday and Friday nights. (Patient not taking: Reported on 06/06/2019)  . Denosumab (PROLIA [place]) Inject 1 each into the skin every 6 (six) months. Injection every 6 months  . folic acid (FOLVITE) 1 MG tablet TAKE 1 TABLET BY MOUTH ONCE DAILY  . gabapentin (NEURONTIN) 100 MG capsule Take 300 mg by mouth at bedtime.   . methotrexate (RHEUMATREX) 2.5 MG tablet Take 15 mg by mouth once a week. Take 6  2.559mpills once a week.  . Misc Natural Products (ADV TURMERIC CURCUMIN COMPLEX PO) Take 600 mg by mouth daily.  . Multiple Vitamin (MULTIVITAMIN) capsule Take by mouth.  . Levin ErpULFATE VAGINAL (TRIMO-SAN) 0.025 % GEL Place 0.25 g vaginally once a week. (Patient not taking: Reported on 07/31/2018)  . pantoprazole (PROTONIX) 40 MG tablet TAKE 1 TABLET BY MOUTH ONCE DAILY.   No facility-administered encounter medications on file as of 09/01/2019.    PHYSICAL EXAM / ROS:   deferred KaJason CoopNP [facility] Center[facility]OVID-19 PATIENT SCREENING TOOL  Person answering questions: __________[name]_______ _____   1.  Is the  patient or any family member in the home showing any signs or symptoms regarding respiratory infection?               Person with Symptom- __________NA_________________  a. Fever                                                                          Yes___ No___          ___________________  b. Shortness of breath  Yes___ No___          ___________________ c. Cough/congestion                                       Yes___  No___         ___________________ d. Body aches/pains                                                         Yes___ No___        ____________________ e. Gastrointestinal symptoms (diarrhea, nausea)           Yes___ No___        ____________________  2. Within the past 14 days, has anyone living in the home had any contact with someone with or under investigation for COVID-19?    Yes___ No_X_   Person __________________

## 2020-01-08 ENCOUNTER — Telehealth: Payer: Self-pay

## 2020-01-08 NOTE — Telephone Encounter (Signed)
lvm to schedule appt. Try to get her in with Dr Dorris Fetch or Sheliah Mends

## 2020-01-08 NOTE — Telephone Encounter (Signed)
Per GI they want [place] to take over her pantoprazole rx.  If pt is needing rx she will need to schedule an appt for refills.  Has not been seen since 3/20

## 2020-02-27 ENCOUNTER — Ambulatory Visit: Payer: Self-pay

## 2020-02-27 ENCOUNTER — Ambulatory Visit: Payer: Self-pay | Admitting: Family Medicine

## 2020-02-27 NOTE — Telephone Encounter (Signed)
   WO 2  Amy Gregory Female, 64 y.o., 08-23-1955 MRN:  [id number] Phone:  [phone] Amy Gregory) PCP:  Kerman Passey, MD Coverage:  Valinda Hoar Blue [employer] Other Next Appt With Internal Medicine 03/16/2020 at 2:00 PM Message from Leafy Ro sent at 02/27/2020 11:24 AM EDT  Summary: please advise   Amy Gregory is not in the office today and pt had to rsc with leisa. Pt bp elevated 178/88 when the appt was made a couple days ago. The husband just called to rsc next available appt is on 03-16-2020 with leisa.. I reschedule to 03-16-2020. I did not realize the bp was elevated and should pt see another provider sooner . Pt husband did not mention if pt was having headaches etc. Please reach out to patient.         Call History   Type Contact Phone User  02/27/2020 11:14 AM EDT Phone (Incoming) Karma Ganja (Emergency Contact) [phone] Leafy Ro   Husband reports pt. Saw her ALS doctor Tuesday and BP was 178/88. Husband states "she feels fine just a mild headache." Declines appointment today.Has appointment 03/16/20. If pt. Can be seen sooner, please advise.Instructed to go to UC/ED if symptoms arise.Verbalizes understanding. Answer Assessment - Initial Assessment Questions 1. BLOOD PRESSURE: "What is the blood pressure?" "Did you take at least two measurements 5 minutes apart?"     Tuesday 2. ONSET: "When did you take your blood pressure?"     Tuesday 3. HOW: "How did you obtain the blood pressure?" (e.g., visiting nurse, automatic home BP monitor)     Dr.'s office 4. HISTORY: "Do you have a history of high blood pressure?"     Yes 5. MEDICATIONS: "Are you taking any medications for blood pressure?" "Have you missed any doses recently?"     No medications 6. OTHER SYMPTOMS: "Do you have any symptoms?" (e.g., headache, chest pain, blurred vision, difficulty breathing, weakness)     Mild 7. PREGNANCY: "Is there any chance you are pregnant?" "When was your last  menstrual period?"     No  Protocols used: BLOOD PRESSURE - HIGH-A-AH

## 2020-02-27 NOTE — Telephone Encounter (Signed)
Lvm letting pt know that she could go to Urgent Care or Hosp  if she is still feeling bad or thinks BP is getting higher for we did not have any openings today

## 2020-03-16 ENCOUNTER — Encounter: Payer: Self-pay | Admitting: Family Medicine

## 2020-03-16 ENCOUNTER — Other Ambulatory Visit: Payer: Self-pay

## 2020-03-16 ENCOUNTER — Ambulatory Visit (INDEPENDENT_AMBULATORY_CARE_PROVIDER_SITE_OTHER): Payer: Medicare HMO | Admitting: Family Medicine

## 2020-03-16 VITALS — BP 138/76 | HR 72 | Temp 98.3°F | Resp 14

## 2020-03-16 DIAGNOSIS — G709 Myoneural disorder, unspecified: Secondary | ICD-10-CM

## 2020-03-16 DIAGNOSIS — Z23 Encounter for immunization: Secondary | ICD-10-CM | POA: Diagnosis not present

## 2020-03-16 DIAGNOSIS — I10 Essential (primary) hypertension: Secondary | ICD-10-CM | POA: Diagnosis not present

## 2020-03-16 DIAGNOSIS — J984 Other disorders of lung: Secondary | ICD-10-CM

## 2020-03-16 DIAGNOSIS — E785 Hyperlipidemia, unspecified: Secondary | ICD-10-CM | POA: Diagnosis not present

## 2020-03-16 DIAGNOSIS — M059 Rheumatoid arthritis with rheumatoid factor, unspecified: Secondary | ICD-10-CM

## 2020-03-16 DIAGNOSIS — G1221 Amyotrophic lateral sclerosis: Secondary | ICD-10-CM

## 2020-03-16 DIAGNOSIS — Z66 Do not resuscitate: Secondary | ICD-10-CM

## 2020-03-16 DIAGNOSIS — H9203 Otalgia, bilateral: Secondary | ICD-10-CM

## 2020-03-16 NOTE — Progress Notes (Signed)
Name: Amy Gregory   MRN: [id number]    DOB: [birth year]   Date:03/16/2020       Progress Note  Chief Complaint  Patient presents with  . Hypertension    BY was elevated at appointment with ALS doctor     Subjective:   Amy Gregory is a 64 y.o. female, presents to clinic for f/up on HTN and to transfer care to new PCP Pt has not been in clinic in person since 2019 Did telephone call/encounters in 2020, and she is new to me Prior PCPs have moved away from the area and/or retired  HTN -she has history of hypertension was previously managed with amlodipine 5 mg but per chart review has not taken this since January 2021.  She presents today reporting that her blood pressure was elevated at office visit with her ALS doctor blood pressure was noted to be 178/88 Blood pressure today is well controlled and adequate for her age and medical history She does not believe that she is taking the medication for over a year.  She is having home health physical therapy, and and work with her and her blood pressure is always normal however when she has gone to the ALS specialist at [facility] where she typically sees about 4 different specialist and providers, there her blood pressure seems to be elevated and have advised her she needs to be established with PCP. BP Readings from Last 3 Encounters:  03/16/20 138/76  11/07/17 138/78  10/24/17 (!) 170/84  Pt denies CP, SOB, exertional sx, LE edema, palpitation, Ha's, visual disturbances, lightheadedness, hypotension, syncope.  She has a history of rheumatoid arthritis managed by Dr. Gavin Potters on methotrexate and folic acid-since patient is now wheelchair-bound due to ALS and there is no impact on her joints Dr. Elveria Rising has changed her to Plaquenil and discontinued methotrexate  GERD managed by Dr. Mechele Collin at [facility] in the past but he has since retired she has a history of dysphagia and in the past year has worked with SLP for dysphagia and  dysarthria -she denies any difficulty swallowing does continue to work with SLP she continues to be on Protonix  Hyperlipidemia -triglycerides elevated in the past, HDL low Lab Results  Component Value Date   CHOL 180 07/23/2017   HDL 43 (L) 07/23/2017   LDLCALC 111 (H) 07/23/2017   TRIG 143 07/23/2017   CHOLHDL 4.2 07/23/2017  Not on medications, she does not wish to repeat any lab work today   ALS managed by [facility]- she is established with physical and occupational therapy for muscle weakness, SLP, and multiple other subspecialists and neurologist for the following diagnoses 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) (Primary Dx);  ALS (amyotrophic lateral sclerosis) (CMS-HCC);  Advance care planning;  Difficulty with NIV use;  Essential hypertension Oropharyngeal dysphagia Dysarthria Patient can only move her neck she has no mobility of her upper extremities her electronic wheelchair does have a support that helps hold her head and neck in place, she is working with pulmonary specialist for it negative inspiratory volume but is having progression of disease, the patient and her spouse today are unable to explain their understanding of their current prognosis, but they do wish to avoid lab work today And reviewing more the specialty notes from the multidisciplinary ALS clinic it does seem that they are trying to get her admitted to hospice In the interim they have put in for home health referrals  Last office  visit with Dr. Jimmey Ralph neurology did discuss further management to help keep her head and neck comfortable they ordered more home health support for this they reviewed her low FVC and discussed options of tracheostomy and invasive ventilation, explained that some patients live much longer with this support some have a few years of preserve quality of life some are miserable have a lot of other problems such as bedsores recurrent trips  to the hospital and the ventilatory support does not improve then disease progression They also discussed natural progression of ALS with end of life that usually ends peacefully due to decreased ventilation, retain carbon dioxide increased sleepiness and then transition and passing.  Patient did decide to be DNR she does not want hospice tracheostomy or invasive ventilation per the 02/24/2020 neurology office visit documentation.  Acute complaint - ears itchy and sometimes hurt, she wants her ears looked at today while she is here  Current Outpatient Medications:  .  Ascorbic Acid (VITAMIN C) 100 MG tablet, Take 100 mg by mouth daily., Disp: , Rfl:  .  cholecalciferol (VITAMIN D) 1000 units tablet, Take 1,000 Units by mouth daily., Disp: , Rfl:  .  gabapentin (NEURONTIN) 100 MG capsule, Take 300 mg by mouth at bedtime. , Disp: , Rfl:  .  hydroxychloroquine (PLAQUENIL) 200 MG tablet, Take 200 mg by mouth daily., Disp: , Rfl:  .  Misc Natural Products (ADV TURMERIC CURCUMIN COMPLEX PO), Take 600 mg by mouth daily., Disp: , Rfl:  .  Multiple Vitamin (MULTIVITAMIN) capsule, Take by mouth., Disp: , Rfl:  .  pantoprazole (PROTONIX) 40 MG tablet, TAKE 1 TABLET BY MOUTH ONCE DAILY., Disp: , Rfl:  .  riluzole (RILUTEK) 50 MG tablet, Take 50 mg by mouth every 12 (twelve) hours., Disp: , Rfl:  .  conjugated estrogens (PREMARIN) vaginal cream, Place 1 Applicatorful vaginally daily. Apply 0.5mg  (pea-sized amount)  just inside the vaginal introitus with a finger-tip every night for two weeks and then Monday, Wednesday and Friday nights. (Patient not taking: Reported on 06/06/2019), Disp: 30 g, Rfl: 12 .  Denosumab (PROLIA [place]), Inject 1 each into the skin every 6 (six) months. Injection every 6 months (Patient not taking: Reported on 03/16/2020), Disp: , Rfl:  .  folic acid (FOLVITE) 1 MG tablet, TAKE 1 TABLET BY MOUTH ONCE DAILY (Patient not taking: Reported on 03/16/2020), Disp: , Rfl:  .  methotrexate  (RHEUMATREX) 2.5 MG tablet, Take 15 mg by mouth once a week. Take 6  2.5mg  pills once a week. (Patient not taking: Reported on 03/16/2020), Disp: , Rfl:  .  OXYQUINOLONE SULFATE VAGINAL (TRIMO-SAN) 0.025 % GEL, Place 0.25 g vaginally once a week. (Patient not taking: Reported on 07/31/2018), Disp: 113.4 g, Rfl: 3  Patient Active Problem List   Diagnosis Date Noted  . Amyotrophic lateral sclerosis (ALS) (HCC) 03/03/2019  . Medication monitoring encounter 07/23/2017  . Essential hypertension, benign 07/09/2017  . Elevated glucose 07/09/2017  . Pessary maintenance 04/03/2017  . Acquired left foot drop 02/22/2017  . Cystocele, midline 02/08/2017  . Vaginal atrophy 02/08/2017  . Menopause 02/08/2017  . Status post laparoscopic supracervical hysterectomy 02/08/2017  . Nocturia 02/08/2017  . Urinary incontinence without sensory awareness 02/08/2017  . Osteoporosis 02/07/2017  . Closed displaced spiral fracture of shaft of right femur (HCC) 10/16/2016  . Status post total right knee replacement using cement 08/22/2016  . 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 Left leg weakness 02/03/2016  .  Rheumatoid arthritis (HCC) 01/20/2016  . Vitamin D deficiency 07/13/2015  . Preventative health care 07/13/2015  . Right upper quadrant abdominal pain 07/13/2015  . Bilateral hearing loss   . Dysphagia   . Impaired memory   . History of diverticulitis   . Arthritis of knee, degenerative 03/04/2015  . Primary osteoarthritis of left knee 03/04/2015  . Gonalgia 02/18/2015  . Can't get food down 07/02/2014  . History of arthroscopy of knee 05/07/2014  . Calculus of kidney 10/02/2013  . Arthritis, degenerative 08/21/2013    Past Surgical History:  Procedure Laterality Date  . ABDOMINAL HYSTERECTOMY  2002   for fibroids, non cancerous reasons, ovaries remain  . BREAST BIOPSY Left 2005-ish   benign  . CATARACT EXTRACTION  2014   OU  . COLONOSCOPY   04/26/04  . ESOPHAGEAL DILATION    . ESOPHAGOGASTRODUODENOSCOPY  02/01/05 and 07/13/14  . INTRAMEDULLARY (IM) NAIL INTERTROCHANTERIC Right 10/17/2016   Procedure: INTRAMEDULLARY (IM) NAIL INTERTROCHANTRIC;  Surgeon: Christena Flake, MD;  Location: [facility] ORS;  Service: Orthopedics;  Laterality: Right;  . KNEE SURGERY Right 03/2014   partial medial and lateral meniscectomy and medial condryle debridement  . LEG SURGERY  2013   fractured left leg, toes, hospitalized for 2 weeks  . ORIF TIBIA FRACTURE     remote ORIF proximal left tiba fracture    Family History  Problem Relation Age of Onset  . Heart disease Mother   . Heart attack Mother   . Rheum arthritis Mother   . Stroke Mother   . Arthritis Mother        RA  . Hypertension Mother   . Stroke Father   . Diabetes Father   . Cancer Sister        lymphoma  . Heart attack Maternal Uncle   . Cancer Maternal Grandmother        leukemia  . Arthritis Maternal Grandmother   . Depression Maternal Grandfather   . Stroke Paternal Grandmother   . Cancer Paternal Grandfather   . Arthritis Sister        rheumatitis  . Thyroid disease Sister   . Hypertension Sister   . Cancer Sister        lung  . Depression Sister   . Breast cancer Paternal Aunt 76  . Kidney cancer Neg Hx   . Bladder Cancer Neg Hx     Social History   Tobacco Use  . Smoking status: Former Smoker    Packs/day: 1.00    Years: 3.00    Pack years: 3.00    Types: Cigarettes    Start date: 05/08/1966    Quit date: 05/08/1969    Years since quitting: 50.8  . Smokeless tobacco: Never Used  Vaping Use  . Vaping Use: Never used  Substance Use Topics  . Alcohol use: No    Alcohol/week: 0.0 standard drinks  . Drug use: No     Allergies  Allergen Reactions  . Sulfa Antibiotics   . Cefdinir Rash  . Oxycodone Rash    PER PATIENT ON 7.25.18    Health Maintenance  Topic Date Due  . COVID-19 Vaccine (1) Never done  . MAMMOGRAM  02/08/2019  . COLONOSCOPY  08/21/2019   . PAP SMEAR-Modifier  10/24/2020  . TETANUS/TDAP  12/12/2025  . INFLUENZA VACCINE  Completed  . Hepatitis C Screening  Completed  . HIV Screening  Completed    Chart Review Today: I personally reviewed active problem list, medication list, allergies,  family history, social history, health maintenance, notes from last encounter, lab results, imaging with the patient/caregiver today.   Review of Systems  10 Systems reviewed and are negative for acute change except as noted in the HPI.  Objective:   Vitals:   03/16/20 1409  BP: 138/76  Pulse: 72  Resp: 14  Temp: 98.3 F (36.8 C)  TempSrc: Oral  SpO2: 98%    There is no height or weight on file to calculate BMI.  Physical Exam Constitutional:      General: She is not in acute distress.    Appearance: She is ill-appearing (chronically ill). She is not toxic-appearing or diaphoretic.  HENT:     Head: Normocephalic and atraumatic.     Right Ear: Tympanic membrane, ear canal and external ear normal. There is no impacted cerumen.     Left Ear: Tympanic membrane, ear canal and external ear normal. There is no impacted cerumen.  Cardiovascular:     Rate and Rhythm: Normal rate and regular rhythm.     Pulses: Normal pulses.     Heart sounds: Normal heart sounds.     Comments: Generalized nonpitting edema to all extremities Pulmonary:     Comments: Normal effort, no distress, diminished breath sounds throughout mid and lower lung fields Abdominal:     General: There is no distension.     Tenderness: There is no abdominal tenderness.  Skin:    General: Skin is warm and dry.     Coloration: Skin is pale. Skin is not jaundiced.  Neurological:     Mental Status: She is alert.     Cranial Nerves: Dysarthria present.     Motor: Weakness and abnormal muscle tone (abscent to all extremities) present.     Comments: Dysarthria, able to speak in short sentences sometimes difficult to understand In wheelchair She is able to move her  neck some Alert Thought content and verbal responses appropriate   Psychiatric:        Behavior: Behavior is cooperative.     Comments: Pleasant         Assessment & Plan:     ICD-10-CM   1. Hypertension, unspecified type  I10    BP here in clinic acceptable w/o medications, pt wishes to avoid blood draws and medications, no concerning sx, continue to monitor  2. Hyperlipidemia, unspecified hyperlipidemia type  E78.5    last lipids elevated, no need to repeat labs and start tx with current prognosis  3. Need for influenza vaccination  Z23 Flu Vaccine QUAD 6+ mos PF IM (Fluarix Quad PF)  4. ALS (amyotrophic lateral sclerosis) (HCC)  G12.21    reviewed recent multi-disciplinary ALS follow-up at [facility] -patient with worsening FVC, wishes to be DNR, has discussed hospice resources for end of life care  5. 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 Rheumatoid arthritis with positive rheumatoid factor, involving unspecified site (HCC)  M05.9    continued Plaquenil- no joint pain complaints  7. DNR (do not resuscitate)  Z66    per ALS neurology discussion on goals of care and end of life care reviewed through care everywhere 02/24/2020 OV  8. Otalgia of both ears  H92.03    Patient complained of ear irritation and itching bilaterally, on exam TM, canal and external ears normal, normal cerumen, can irrigate gently or try Zyrtec  Pt new to me, had not been in clinic in nearly two years, thorough chart review and discussion with pt and her husband today. Reviewed her current dx, prognosis, wishes -  Agree that there is no indication to do extensive lab work for conditions which we will not be treating aggressively given her recurrent prognosis.  Her blood pressure today is at goal for her age and current health.  No need to start medications, encourage them to continue to  monitor.  I did explain that if she began to have symptoms such as chest pain or palpitations with elevated blood pressure then we would want to see her and may need to start treatment again, but as of right now no need to restart medications.  I offered my support for any thing that they may need.  Greater than 50% of this visit was spent in direct face-to-face counseling, obtaining history and physical, discussing and educating pt on treatment plan.  Total time of this visit was 45 min +.  Remainder of time involved but was not limited to reviewing chart (recent and pertinent OV notes and labs), documentation in EMR, and coordinating care and treatment plan.   Danelle Berry, PA-C 03/16/20 2:30 PM

## 2020-04-24 ENCOUNTER — Inpatient Hospital Stay
Admission: EM | Admit: 2020-04-24 | Discharge: 2020-05-03 | DRG: 853 | Disposition: A | Discharge status: Home Health | Payer: Medicare HMO | Attending: Internal Medicine | Admitting: Internal Medicine

## 2020-04-24 ENCOUNTER — Other Ambulatory Visit: Payer: Self-pay

## 2020-04-24 ENCOUNTER — Encounter: Payer: Self-pay | Admitting: Emergency Medicine

## 2020-04-24 ENCOUNTER — Emergency Department: Payer: Medicare HMO

## 2020-04-24 DIAGNOSIS — K219 Gastro-esophageal reflux disease without esophagitis: Secondary | ICD-10-CM | POA: Diagnosis present

## 2020-04-24 DIAGNOSIS — H9193 Unspecified hearing loss, bilateral: Secondary | ICD-10-CM | POA: Diagnosis present

## 2020-04-24 DIAGNOSIS — M21372 Foot drop, left foot: Secondary | ICD-10-CM | POA: Diagnosis present

## 2020-04-24 DIAGNOSIS — Z885 Allergy status to narcotic agent status: Secondary | ICD-10-CM

## 2020-04-24 DIAGNOSIS — A419 Sepsis, unspecified organism: Principal | ICD-10-CM | POA: Diagnosis present

## 2020-04-24 DIAGNOSIS — R1310 Dysphagia, unspecified: Secondary | ICD-10-CM

## 2020-04-24 DIAGNOSIS — Z7401 Bed confinement status: Secondary | ICD-10-CM

## 2020-04-24 DIAGNOSIS — R531 Weakness: Secondary | ICD-10-CM | POA: Diagnosis not present

## 2020-04-24 DIAGNOSIS — R1319 Other dysphagia: Secondary | ICD-10-CM

## 2020-04-24 DIAGNOSIS — Z20822 Contact with and (suspected) exposure to covid-19: Secondary | ICD-10-CM | POA: Diagnosis present

## 2020-04-24 DIAGNOSIS — N201 Calculus of ureter: Secondary | ICD-10-CM

## 2020-04-24 DIAGNOSIS — Z8261 Family history of arthritis: Secondary | ICD-10-CM

## 2020-04-24 DIAGNOSIS — G1221 Amyotrophic lateral sclerosis: Secondary | ICD-10-CM | POA: Diagnosis present

## 2020-04-24 DIAGNOSIS — J690 Pneumonitis due to inhalation of food and vomit: Secondary | ICD-10-CM | POA: Diagnosis present

## 2020-04-24 DIAGNOSIS — R17 Unspecified jaundice: Secondary | ICD-10-CM | POA: Diagnosis present

## 2020-04-24 DIAGNOSIS — J9601 Acute respiratory failure with hypoxia: Secondary | ICD-10-CM

## 2020-04-24 DIAGNOSIS — J189 Pneumonia, unspecified organism: Secondary | ICD-10-CM

## 2020-04-24 DIAGNOSIS — Z90710 Acquired absence of both cervix and uterus: Secondary | ICD-10-CM

## 2020-04-24 DIAGNOSIS — R4702 Dysphasia: Secondary | ICD-10-CM | POA: Diagnosis present

## 2020-04-24 DIAGNOSIS — E8770 Fluid overload, unspecified: Secondary | ICD-10-CM | POA: Diagnosis not present

## 2020-04-24 DIAGNOSIS — I10 Essential (primary) hypertension: Secondary | ICD-10-CM | POA: Diagnosis present

## 2020-04-24 DIAGNOSIS — Z66 Do not resuscitate: Secondary | ICD-10-CM | POA: Diagnosis present

## 2020-04-24 DIAGNOSIS — R4701 Aphasia: Secondary | ICD-10-CM | POA: Diagnosis present

## 2020-04-24 DIAGNOSIS — Z881 Allergy status to other antibiotic agents status: Secondary | ICD-10-CM

## 2020-04-24 DIAGNOSIS — M1990 Unspecified osteoarthritis, unspecified site: Secondary | ICD-10-CM | POA: Diagnosis present

## 2020-04-24 DIAGNOSIS — M810 Age-related osteoporosis without current pathological fracture: Secondary | ICD-10-CM | POA: Diagnosis present

## 2020-04-24 DIAGNOSIS — Z87442 Personal history of urinary calculi: Secondary | ICD-10-CM

## 2020-04-24 DIAGNOSIS — E876 Hypokalemia: Secondary | ICD-10-CM | POA: Diagnosis present

## 2020-04-24 DIAGNOSIS — N390 Urinary tract infection, site not specified: Secondary | ICD-10-CM | POA: Diagnosis present

## 2020-04-24 DIAGNOSIS — Z8249 Family history of ischemic heart disease and other diseases of the circulatory system: Secondary | ICD-10-CM

## 2020-04-24 DIAGNOSIS — Z882 Allergy status to sulfonamides status: Secondary | ICD-10-CM

## 2020-04-24 DIAGNOSIS — Z87891 Personal history of nicotine dependence: Secondary | ICD-10-CM

## 2020-04-24 DIAGNOSIS — M069 Rheumatoid arthritis, unspecified: Secondary | ICD-10-CM | POA: Diagnosis present

## 2020-04-24 DIAGNOSIS — E870 Hyperosmolality and hypernatremia: Secondary | ICD-10-CM | POA: Diagnosis present

## 2020-04-24 DIAGNOSIS — J9621 Acute and chronic respiratory failure with hypoxia: Secondary | ICD-10-CM | POA: Diagnosis present

## 2020-04-24 DIAGNOSIS — N136 Pyonephrosis: Secondary | ICD-10-CM | POA: Diagnosis present

## 2020-04-24 DIAGNOSIS — E871 Hypo-osmolality and hyponatremia: Secondary | ICD-10-CM | POA: Diagnosis present

## 2020-04-24 DIAGNOSIS — B9620 Unspecified Escherichia coli [E. coli] as the cause of diseases classified elsewhere: Secondary | ICD-10-CM | POA: Diagnosis present

## 2020-04-24 HISTORY — DX: Amyotrophic lateral sclerosis: G12.21

## 2020-04-24 LAB — COMPREHENSIVE METABOLIC PANEL
ALT: 34 U/L (ref 0–44)
AST: 29 U/L (ref 15–41)
Albumin: 3.5 g/dL (ref 3.5–5.0)
Alkaline Phosphatase: 97 U/L (ref 38–126)
Anion gap: 12 (ref 5–15)
BUN: 20 mg/dL (ref 8–23)
CO2: 26 mmol/L (ref 22–32)
Calcium: 8.7 mg/dL — ABNORMAL LOW (ref 8.9–10.3)
Chloride: 97 mmol/L — ABNORMAL LOW (ref 98–111)
Creatinine, Ser: 0.4 mg/dL — ABNORMAL LOW (ref 0.44–1.00)
GFR, Estimated: >60 mL/min (ref >60)
Glucose, Bld: 119 mg/dL — ABNORMAL HIGH (ref 70–99)
Potassium: 3 mmol/L — ABNORMAL LOW (ref 3.5–5.1)
Sodium: 135 mmol/L (ref 135–145)
Total Bilirubin: 2.6 mg/dL — ABNORMAL HIGH (ref 0.3–1.2)
Total Protein: 6.9 g/dL (ref 6.5–8.1)

## 2020-04-24 LAB — URINALYSIS, COMPLETE (UACMP) WITH MICROSCOPIC
Bilirubin Urine: NEGATIVE
Glucose, UA: NEGATIVE mg/dL
Ketones, ur: 20 mg/dL — AB
Nitrite: POSITIVE — AB
Protein, ur: >=300 mg/dL — AB
Specific Gravity, Urine: 1.019 (ref 1.005–1.030)
WBC, UA: >50 WBC/hpf — ABNORMAL HIGH (ref 0–5)
pH: 6 (ref 5.0–8.0)

## 2020-04-24 LAB — CBC
HCT: 40.8 % (ref 36.0–46.0)
Hemoglobin: 13.8 g/dL (ref 12.0–15.0)
MCH: 30.7 pg (ref 26.0–34.0)
MCHC: 33.8 g/dL (ref 30.0–36.0)
MCV: 90.9 fL (ref 80.0–100.0)
Platelets: 164 10*3/uL (ref 150–400)
RBC: 4.49 MIL/uL (ref 3.87–5.11)
RDW: 13.5 % (ref 11.5–15.5)
WBC: 13.2 10*3/uL — ABNORMAL HIGH (ref 4.0–10.5)
nRBC: 0 % (ref 0.0–0.2)

## 2020-04-24 LAB — TROPONIN I (HIGH SENSITIVITY): Troponin I (High Sensitivity): 26 ng/L — ABNORMAL HIGH (ref <18)

## 2020-04-24 LAB — RESP PANEL BY RT-PCR (FLU A&B, COVID) ARPGX2
Influenza A by PCR: NEGATIVE
Influenza B by PCR: NEGATIVE
SARS Coronavirus 2 by RT PCR: NEGATIVE

## 2020-04-24 MED ORDER — POTASSIUM CHLORIDE IN NACL 20-0.9 MEQ/L-% IV SOLN
INTRAVENOUS | Status: AC
  Filled 2020-04-24: qty 1000

## 2020-04-24 MED ORDER — METOPROLOL TARTRATE 5 MG/5ML IV SOLN
5.0000 mg | Freq: Four times a day (QID) | INTRAVENOUS | Status: AC
  Administered 2020-04-24 – 2020-04-25 (×2): 5 mg via INTRAVENOUS
  Filled 2020-04-24 (×2): qty 5

## 2020-04-24 MED ORDER — HYDRALAZINE HCL 20 MG/ML IJ SOLN
10.0000 mg | Freq: Once | INTRAMUSCULAR | Status: AC
  Administered 2020-04-24: 10 mg via INTRAVENOUS
  Filled 2020-04-24: qty 1

## 2020-04-24 MED ORDER — POTASSIUM CHLORIDE 10 MEQ/100ML IV SOLN
10.0000 meq | Freq: Once | INTRAVENOUS | Status: AC
  Administered 2020-04-24: 10 meq via INTRAVENOUS
  Filled 2020-04-24: qty 100

## 2020-04-24 MED ORDER — ACETAMINOPHEN 325 MG PO TABS: 650.0000 mg | ORAL_TABLET | Freq: Four times a day (QID) | ORAL | Status: DC | PRN

## 2020-04-24 MED ORDER — PROCHLORPERAZINE EDISYLATE 10 MG/2ML IJ SOLN: 5.0000 mg | INTRAMUSCULAR | Status: DC | PRN

## 2020-04-24 MED ORDER — METRONIDAZOLE IN NACL 5-0.79 MG/ML-% IV SOLN
500.0000 mg | Freq: Three times a day (TID) | INTRAVENOUS | Status: DC
  Administered 2020-04-24 – 2020-04-25 (×2): 500 mg via INTRAVENOUS
  Filled 2020-04-24 (×2): qty 100

## 2020-04-24 MED ORDER — LEVOFLOXACIN IN D5W 750 MG/150ML IV SOLN
750.0000 mg | Freq: Once | INTRAVENOUS | Status: AC
  Administered 2020-04-24: 750 mg via INTRAVENOUS
  Filled 2020-04-24: qty 150

## 2020-04-24 MED ORDER — ENOXAPARIN SODIUM 40 MG/0.4ML ~~LOC~~ SOLN
40.0000 mg | SUBCUTANEOUS | Status: DC
  Administered 2020-04-24 – 2020-04-28 (×4): 40 mg via SUBCUTANEOUS
  Filled 2020-04-24 (×4): qty 0.4

## 2020-04-24 MED ORDER — HYDRALAZINE HCL 20 MG/ML IJ SOLN
20.0000 mg | INTRAMUSCULAR | Status: DC | PRN
  Administered 2020-04-25 – 2020-04-26 (×3): 20 mg via INTRAVENOUS
  Filled 2020-04-24 (×3): qty 1

## 2020-04-24 MED ORDER — ACETAMINOPHEN 650 MG RE SUPP: 650.0000 mg | Freq: Four times a day (QID) | RECTAL | Status: DC | PRN

## 2020-04-24 MED ORDER — GLYCOPYRROLATE 0.2 MG/ML IJ SOLN
0.1000 mg | Freq: Once | INTRAMUSCULAR | Status: DC
  Filled 2020-04-24: qty 0.5

## 2020-04-24 NOTE — ED Notes (Signed)
Messaged pharmacy about missing dose of glycopyrrolate.

## 2020-04-24 NOTE — H&P (Signed)
History and Physical    Amy Gregory QMG:[id number] DOB: 08/27/1955 DOA: 04/24/2020  PCP: Danelle Berry, PA-C   Patient coming from: Home.  I have personally briefly reviewed patient's old medical records in [facility]  Chief Complaint: Weakness and diarrhea  HPI: Amy ANASTAS is a 64 y.o. female with medical history significant of acquired left foot drop, ALS, bilateral hearing loss, dysphagia, GERD, hiatal hernia, diverticulosis/diverticulitis, impaired memory, nephrolithiasis, osteoarthritis, osteoporosis, rheumatoid arthritis who is coming to the emergency department due to generalized weakness, associated with diarrhea, increased secretions and inability to swallow safely.  No fever, chills, positive cough.  No chest pain, palpitations, diaphoresis, PND, orthopnea or pitting edema of the lower extremities.  She has had 1-2 daily episodes of diarrhea and mild abdominal pain since earlier this week.  No nausea, vomiting, constipation, melena or hematochezia.  The patient has an indwelling catheter.  ED Course: Initial vital signs were temperature 99.7 F, pulse 89, respirations 20, BP 205/69 mmHg O2 sat 98% on room air.  The patient received 10 mEq of KCl IVPB, 10 mg of hydralazine in the emergency department.  She was started on Levaquin to cover pneumonia and possible UTI.  I added metronidazole for anaerobic and intra-abdominal coverage.  Work-up: Urinalysis cloudy with moderate hemoglobinuria, ketonuria of 20 and proteinuria more than 300 mg/dL, there with positive nitrates, large leukocyte esterase, 0-5 RBC, more than 50 WBC and many bacteria on microscopic examination.  Her CBC shows a white count 13.2, hemoglobin 13.8 g/dL and platelets 326. CMP shows a potassium of 3.0 and chloride of 97 mmol/L.  Glucose 119, BUN 20, creatinine 0.40 and calcium 8.7 mg/dL.  Chest radiograph shows right middle lobe consolidation.  Review of Systems: As per HPI otherwise all other systems  reviewed and are negative.  Past Medical History:  Diagnosis Date  . Acquired left foot drop 02/22/2017  . ALS (amyotrophic lateral sclerosis) (HCC)   . Bilateral hearing loss   . Dysphagia   . GERD (gastroesophageal reflux disease)   . Hiatal hernia    small  . History of diverticulitis   . Impaired memory   . Nephrolithiasis   . OA (osteoarthritis)   . Osteoporosis 02/07/2017  . Rheumatoid arthritis (HCC)    managed by Dr. Gavin Potters   Past Surgical History:  Procedure Laterality Date  . ABDOMINAL HYSTERECTOMY  2002   for fibroids, non cancerous reasons, ovaries remain  . BREAST BIOPSY Left 2005-ish   benign  . CATARACT EXTRACTION  2014   OU  . COLONOSCOPY  04/26/04  . ESOPHAGEAL DILATION    . ESOPHAGOGASTRODUODENOSCOPY  02/01/05 and 07/13/14  . INTRAMEDULLARY (IM) NAIL INTERTROCHANTERIC Right 10/17/2016   Procedure: INTRAMEDULLARY (IM) NAIL INTERTROCHANTRIC;  Surgeon: Christena Flake, MD;  Location: [facility] ORS;  Service: Orthopedics;  Laterality: Right;  . KNEE SURGERY Right 03/2014   partial medial and lateral meniscectomy and medial condryle debridement  . LEG SURGERY  2013   fractured left leg, toes, hospitalized for 2 weeks  . ORIF TIBIA FRACTURE     remote ORIF proximal left tiba fracture   Social History  reports that she quit smoking about 50 years ago. Her smoking use included cigarettes. She started smoking about 54 years ago. She has a 3.00 pack-year smoking history. She has never used smokeless tobacco. She reports that she does not drink alcohol and does not use drugs.  Allergies  Allergen Reactions  . Sulfa Antibiotics   . Cefdinir Rash  .  Oxycodone Rash    PER PATIENT ON 7.25.18   Family History  Problem Relation Age of Onset  . Heart disease Mother   . Heart attack Mother   . Rheum arthritis Mother   . Stroke Mother   . Arthritis Mother        RA  . Hypertension Mother   . Stroke Father   . Diabetes Father   . Cancer Sister        lymphoma  . Heart  attack Maternal Uncle   . Cancer Maternal Grandmother        leukemia  . Arthritis Maternal Grandmother   . Depression Maternal Grandfather   . Stroke Paternal Grandmother   . Cancer Paternal Grandfather   . Arthritis Sister        rheumatitis  . Thyroid disease Sister   . Hypertension Sister   . Cancer Sister        lung  . Depression Sister   . Breast cancer Paternal Aunt 60  . Kidney cancer Neg Hx   . Bladder Cancer Neg Hx    Prior to Admission medications   Medication Sig Start Date End Date Taking? Authorizing Provider  Ascorbic Acid (VITAMIN C) 100 MG tablet Take 100 mg by mouth daily. Patient not taking: Reported on 04/24/2020    [provider]  cholecalciferol (VITAMIN D) 1000 units tablet Take 1,000 Units by mouth daily. Patient not taking: Reported on 04/24/2020    [provider]  gabapentin (NEURONTIN) 100 MG capsule Take 300 mg by mouth at bedtime.  Patient not taking: Reported on 04/24/2020    [provider]  hydroxychloroquine (PLAQUENIL) 200 MG tablet Take 200 mg by mouth daily. Patient not taking: Reported on 04/24/2020    [provider]  Misc Natural Products (ADV TURMERIC CURCUMIN COMPLEX PO) Take 600 mg by mouth daily. Patient not taking: Reported on 04/24/2020    [provider]  Multiple Vitamin (MULTIVITAMIN) capsule Take by mouth. Patient not taking: Reported on 04/24/2020    [provider]  pantoprazole (PROTONIX) 40 MG tablet TAKE 1 TABLET BY MOUTH ONCE DAILY. Patient not taking: Reported on 04/24/2020 05/12/15   [provider]  riluzole (RILUTEK) 50 MG tablet Take 50 mg by mouth every 12 (twelve) hours. Patient not taking: Reported on 04/24/2020    [provider]    Physical Exam: Vitals:   04/24/20 1700 04/24/20 1730 04/24/20 1800 04/24/20 1830  BP: (!) 202/81 (!) 175/62 (!) 171/57 (!) 174/61  Pulse: 86 81 81 79  Resp: (!) 25 (!) 26 (!) 28 (!) 24  Temp:      TempSrc:       SpO2: 96% 94% 96% 96%  Weight:      Height:        Constitutional: Looks chronically ill. Eyes: PERRL, lids and conjunctivae normal ENMT: Mucous membranes are moist. Posterior pharynx clear of any exudate or lesions. Neck: normal, supple, no masses, no thyromegaly Respiratory: clear to auscultation bilaterally, no wheezing, no crackles. Normal respiratory effort. No accessory muscle use.  Cardiovascular: Regular rate and rhythm, 2/6 systolic murmur, no rubs / gallops.  No edema.  2+ pedal pulses. No carotid bruits.  Abdomen: Obese, nondistended.  Bowel sounds positive.  Soft, no tenderness, no masses palpated. No hepatosplenomegaly. Bowel sounds positive.  Musculoskeletal: Generalized weakness.  No clubbing / cyanosis.  Good ROM, no contractures.  Relaxed muscle tone.  Skin: no rashes, lesions, ulcers on very limited dermatological examination. Neurologic: CN  2-12 grossly intact.  Generalized motor weakness with functional quadriparesis. Psychiatric: Alert and oriented x 3. Normal mood.   Labs on Admission: I have personally reviewed following labs and imaging studies  CBC: Recent Labs  Lab 04/24/20 1640  WBC 13.2*  HGB 13.8  HCT 40.8  MCV 90.9  PLT 164    Basic Metabolic Panel: Recent Labs  Lab 04/24/20 1640  NA 135  K 3.0*  CL 97*  CO2 26  GLUCOSE 119*  BUN 20  CREATININE 0.40*  CALCIUM 8.7*    GFR: Estimated Creatinine Clearance: 77.3 mL/min (A) (by C-G formula based on SCr of 0.4 mg/dL (L)).  Liver Function Tests: Recent Labs  Lab 04/24/20 1640  AST 29  ALT 34  ALKPHOS 97  BILITOT 2.6*  PROT 6.9  ALBUMIN 3.5    Urine analysis:    Component Value Date/Time   COLORURINE AMBER (A) 04/24/2020 1712   APPEARANCEUR CLOUDY (A) 04/24/2020 1712        LABSPEC 1.019 04/24/2020 1712        PHURINE 6.0 04/24/2020 1712   GLUCOSEU NEGATIVE 04/24/2020 1712        HGBUR MODERATE (A) 04/24/2020 1712   BILIRUBINUR NEGATIVE 04/24/2020 1712                   KETONESUR 20 (A) 04/24/2020 1712   PROTEINUR >=300 (A) 04/24/2020 1712        NITRITE POSITIVE (A) 04/24/2020 1712   LEUKOCYTESUR LARGE (A) 04/24/2020 1712        Radiological Exams on Admission: DG Chest Portable 1 View  Result Date: 04/24/2020 CLINICAL DATA:  Weakness.  Patient with history of ALS. EXAM: PORTABLE CHEST 1 VIEW COMPARISON:  October 16, 2016 FINDINGS: Opacity in the medial right lung base. The lungs are otherwise clear. No pneumothorax. The heart, hila, and mediastinum are normal. No other acute abnormalities. IMPRESSION: Opacity in the medial right lung base could represent atelectasis or infiltrate such as pneumonia or aspiration. Recommend clinical correlation and short-term follow-up imaging to ensure resolution. Electronically Signed   By: Gerome Sam III M.D   On: 04/24/2020 17:06    EKG: Independently reviewed. Vent. rate 90 BPM PR interval * ms QRS duration 94 ms QT/QTc 312/382 ms P-R-T axes 36 -21 125 Sinus rhythm LVH with secondary repolarization abnormality Anterior Q waves, possibly due to LVH  Assessment/Plan Principal Problem:   Aspiration pneumonia (HCC) Observation/MedSurg. Supplemental oxygen as needed. Bronchodilators as needed. Allergic to beta lactams. Continue Levaquin per pharmacy. Add metronidazole GI and anaerobic coverage.  Active Problems:   Hypokalemia Replacing. Follow-up potassium level.    Rheumatoid arthritis (HCC) No longer on treatment. Follow-up with PCP/rheumatology.    Essential hypertension, benign Currently not on therapy at home. Will use parenteral meds to avoid aspiration. Metoprolol 5 mg IVP every 6 hours. Hydralazine 20 mg as needed every 4 hours. Monitor blood pressure.    Amyotrophic lateral sclerosis (ALS) (HCC) Consult TOC in AM. Consider PT evaluation.    Hyperbilirubinemia Repeat bilirubin level in a.m. Further work-up depending on result.    DVT prophylaxis: Lovenox SQ. Code Status:    Full code. Family Communication:  Her husband was present in the ED room. Disposition Plan:   Patient is from:  Home.  Anticipated DC to:  TBD.  Anticipated DC date:  04/25/2020 or 04/26/2020.  Anticipated DC barriers: Clinical status.  Consults called:  TOC team. Admission status:  Observation/MedSurg.   Severity of Illness: High due to  aspiration pneumonia in the setting of aspiration due to advanced ALS.  Bobette Mo MD [facility]  How to contact the [facility] Attending or Consulting provider 7A - 7P or covering provider during after hours 7P -7A, for this patient?   1. Check the care team in [facility] and look for a) attending/consulting TRH provider listed and b) the [facility] team listed 2. Log into [email or web address] and use [facility] universal password to access. If you do not have the password, please contact the hospital operator. 3. Locate the [facility] provider you are looking for under [facility] and page to a number that you can be directly reached. 4. If you still have difficulty reaching the provider, please page the [facility] (Director on Call) for the Hospitalists listed on amion for assistance.  04/24/2020, 9:48 PM   This document was prepared using Dragon voice recognition software and may contain some unintended transcription errors.

## 2020-04-24 NOTE — ED Notes (Signed)
Pt brief soiled with urine at this time. Pt brief and pad changed and purewick placed. Pt clean and dry at this time. Denies any needs at this point.

## 2020-04-24 NOTE — ED Provider Notes (Signed)
[facility] Emergency Department Provider Note  Time seen: 4:32 PM  I have reviewed the triage vital signs and the nursing notes.   HISTORY  Chief Complaint Weakness   HPI Amy Gregory is a 64 y.o. female with a past medical history of ALS, arthritis, gastric reflux, presents to the emergency department by EMS for worsening weakness excessive secretions being unable to swallow. Family has been attempting to get the patient in hospice care but has not been successful. Patient states she has no use of her arms or legs, has a pillow to keep her head upright otherwise cannot keep her head upright. Patient has difficulty speaking and is very difficult to understand. Patient states pain throughout her body but this is chronic, states she does not want any pain medication as she has not tolerated it previously. No known fever although the patient has a 99.7 temp in the emergency department.   Past Medical History:  Diagnosis Date  . Acquired left foot drop 02/22/2017  . Bilateral hearing loss   . Dysphagia   . GERD (gastroesophageal reflux disease)   . Hiatal hernia    small  . History of diverticulitis   . Impaired memory   . Nephrolithiasis   . OA (osteoarthritis)   . Osteoporosis 02/07/2017  . Rheumatoid arthritis (HCC)    managed by Dr. Gavin Potters    Patient Active Problem List   Diagnosis Date Noted  . Amyotrophic lateral sclerosis (ALS) (HCC) 03/03/2019  . Medication monitoring encounter 07/23/2017  . Essential hypertension, benign 07/09/2017  . Elevated glucose 07/09/2017  . Pessary maintenance 04/03/2017  . Acquired left foot drop 02/22/2017  . Cystocele, midline 02/08/2017  . Vaginal atrophy 02/08/2017  . Menopause 02/08/2017  . Status post laparoscopic supracervical hysterectomy 02/08/2017  . Nocturia 02/08/2017  . Urinary incontinence without sensory awareness 02/08/2017  . Osteoporosis 02/07/2017  . Closed displaced spiral fracture of shaft  of right femur (HCC) 10/16/2016  . Status post total right knee replacement using cement 08/22/2016  . 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 Left leg weakness 02/03/2016  . Rheumatoid arthritis (HCC) 01/20/2016  . Vitamin D deficiency 07/13/2015  . Preventative health care 07/13/2015  . Right upper quadrant abdominal pain 07/13/2015  . Bilateral hearing loss   . Dysphagia   . Impaired memory   . History of diverticulitis   . Arthritis of knee, degenerative 03/04/2015  . Primary osteoarthritis of left knee 03/04/2015  . Gonalgia 02/18/2015  . Can't get food down 07/02/2014  . History of arthroscopy of knee 05/07/2014  . Calculus of kidney 10/02/2013  . Arthritis, degenerative 08/21/2013    Past Surgical History:  Procedure Laterality Date  . ABDOMINAL HYSTERECTOMY  2002   for fibroids, non cancerous reasons, ovaries remain  . BREAST BIOPSY Left 2005-ish   benign  . CATARACT EXTRACTION  2014   OU  . COLONOSCOPY  04/26/04  . ESOPHAGEAL DILATION    . ESOPHAGOGASTRODUODENOSCOPY  02/01/05 and 07/13/14  . INTRAMEDULLARY (IM) NAIL INTERTROCHANTERIC Right 10/17/2016   Procedure: INTRAMEDULLARY (IM) NAIL INTERTROCHANTRIC;  Surgeon: Christena Flake, MD;  Location: [facility] ORS;  Service: Orthopedics;  Laterality: Right;  . KNEE SURGERY Right 03/2014   partial medial and lateral meniscectomy and medial condryle debridement  . LEG SURGERY  2013   fractured left leg, toes, hospitalized for 2 weeks  . ORIF TIBIA FRACTURE     remote ORIF proximal left tiba fracture  Prior to Admission medications   Medication Sig Start Date End Date Taking? Authorizing Provider  Ascorbic Acid (VITAMIN C) 100 MG tablet Take 100 mg by mouth daily.    [provider]  cholecalciferol (VITAMIN D) 1000 units tablet Take 1,000 Units by mouth daily.    [provider]  gabapentin (NEURONTIN) 100 MG capsule Take 300 mg by mouth at bedtime.      [provider]  hydroxychloroquine (PLAQUENIL) 200 MG tablet Take 200 mg by mouth daily.    [provider]  Misc Natural Products (ADV TURMERIC CURCUMIN COMPLEX PO) Take 600 mg by mouth daily.    [provider]  Multiple Vitamin (MULTIVITAMIN) capsule Take by mouth.    [provider]  pantoprazole (PROTONIX) 40 MG tablet TAKE 1 TABLET BY MOUTH ONCE DAILY. 05/12/15   [provider]  riluzole (RILUTEK) 50 MG tablet Take 50 mg by mouth every 12 (twelve) hours.    [provider]    Allergies  Allergen Reactions  . Sulfa Antibiotics   . Cefdinir Rash  . Oxycodone Rash    PER PATIENT ON 7.25.18    Family History  Problem Relation Age of Onset  . Heart disease Mother   . Heart attack Mother   . Rheum arthritis Mother   . Stroke Mother   . Arthritis Mother        RA  . Hypertension Mother   . Stroke Father   . Diabetes Father   . Cancer Sister        lymphoma  . Heart attack Maternal Uncle   . Cancer Maternal Grandmother        leukemia  . Arthritis Maternal Grandmother   . Depression Maternal Grandfather   . Stroke Paternal Grandmother   . Cancer Paternal Grandfather   . Arthritis Sister        rheumatitis  . Thyroid disease Sister   . Hypertension Sister   . Cancer Sister        lung  . Depression Sister   . Breast cancer Paternal Aunt 69  . Kidney cancer Neg Hx   . Bladder Cancer Neg Hx     Social History Social History   Tobacco Use  . Smoking status: Former Smoker    Packs/day: 1.00    Years: 3.00    Pack years: 3.00    Types: Cigarettes    Start date: 05/08/1966    Quit date: 05/08/1969    Years since quitting: 50.9  . Smokeless tobacco: Never Used  Vaping Use  . Vaping Use: Never used  Substance Use Topics  . Alcohol use: No    Alcohol/week: 0.0 standard drinks  . Drug use: No    Review of Systems Constitutional: Negative for fever. Cardiovascular: Negative for chest pain. Respiratory:  Negative for shortness of breath. Gastrointestinal: Difficulty swallowing secretions. Genitourinary: Negative for urinary compaints Musculoskeletal: Weakness throughout body, muscle/body aches All other ROS negative  ____________________________________________   PHYSICAL EXAM:  VITAL SIGNS: ED Triage Vitals  Enc Vitals Group     BP 04/24/20 1630 (!) 209/69     Pulse Rate 04/24/20 1630 89     Resp --      Temp 04/24/20 1630 99.7 F (37.6 C)     Temp Source 04/24/20 1630 Oral     SpO2 04/24/20 1630 98 %     Weight --      Height --      Head Circumference --  Peak Flow --      Pain Score 04/24/20 1632 7     Pain Loc --      Pain Edu? --      Excl. in GC? --    Constitutional: Patient is awake and alert, lying in bed. Answers questions appropriately but difficult to understand. Eyes: Normal exam ENT      Head: Normocephalic and atraumatic.      Mouth/Throat: Dry appearing mucous membranes Cardiovascular: Normal rate, regular rhythm.  Respiratory: Normal respiratory effort without tachypnea nor retractions. Breath sounds are clear Gastrointestinal: Soft and nontender. No distention.  Musculoskeletal: Nontender extremities. Patient unable to move extremities. Neurologic: Patient is able to speak and respond but difficulty speaking due to ALS. Unable to move her extremities due to ALS which she states is chronic Skin:  Skin is warm, dry   ____________________________________________    EKG  EKG viewed and interpreted by myself shows a normal sinus rhythm at 90 bpm with a narrow QRS, left axis deviation, largely normal intervals with nonspecific ST changes.  ____________________________________________    RADIOLOGY  X-ray concerning for possible right midlung and base opacity.  ____________________________________________   INITIAL IMPRESSION / ASSESSMENT AND PLAN / ED COURSE  Pertinent labs & imaging results that were available during my care of the patient  were reviewed by me and considered in my medical decision making (see chart for details).   Patient presents emergency department having difficulty swallowing secretions at home. Patient with ALS and worsening progressive weakness has no use of her arms or legs minimal use of her head and difficulty speaking at baseline. They have been attempting to get hospice care without success. I have placed a social work consultation we will also obtain lab work to evaluate given her low-grade temperature 99.7 in the emergency department.  Patient's urinalysis consistent with fairly significant urinary tract infection.  Patient's lab work does show mild leukocytosis.  Mild hypokalemia we will dose potassium..  Chest x-ray showing possibility of right-sided pneumonia or aspiration.  We will send blood cultures, urine culture.  We will dose broad-spectrum antibiotics of Levaquin which should cover both UTI and pneumonia.    Amy Gregory was evaluated in Emergency Department on 04/24/2020 for the symptoms described in the history of present illness. She was evaluated in the context of the global COVID-19 pandemic, which necessitated consideration that the patient might be at risk for infection with the SARS-CoV-2 virus that causes COVID-19. Institutional protocols and algorithms that pertain to the evaluation of patients at risk for COVID-19 are in a state of rapid change based on information released by regulatory bodies including the CDC and federal and state organizations. These policies and algorithms were followed during the patient's care in the ED.  ____________________________________________   FINAL CLINICAL IMPRESSION(S) / ED DIAGNOSES  Weakness Pneumonia Urinary tract infection Amey atrophic lateral sclerosis   Minna Antis, MD 04/24/20 1907

## 2020-04-24 NOTE — ED Notes (Signed)
Spoke with Dr. Lenard Lance, MD regarding pt's BP. See orders.

## 2020-04-24 NOTE — ED Triage Notes (Signed)
Patient BIB EMS with chief complaint of weakness. Per EMS patient having excessive secretions and unable to swallow. Patient with hx of ALS. Family is attempting to put patient in Hospice care.

## 2020-04-24 NOTE — ED Notes (Signed)
Unable to obtain blood cultures through IV or straight stick.

## 2020-04-25 DIAGNOSIS — N390 Urinary tract infection, site not specified: Secondary | ICD-10-CM | POA: Diagnosis not present

## 2020-04-25 DIAGNOSIS — Z87442 Personal history of urinary calculi: Secondary | ICD-10-CM | POA: Diagnosis not present

## 2020-04-25 DIAGNOSIS — G1221 Amyotrophic lateral sclerosis: Secondary | ICD-10-CM | POA: Diagnosis present

## 2020-04-25 DIAGNOSIS — Z66 Do not resuscitate: Secondary | ICD-10-CM | POA: Diagnosis present

## 2020-04-25 DIAGNOSIS — A419 Sepsis, unspecified organism: Secondary | ICD-10-CM | POA: Diagnosis present

## 2020-04-25 DIAGNOSIS — M1990 Unspecified osteoarthritis, unspecified site: Secondary | ICD-10-CM | POA: Diagnosis present

## 2020-04-25 DIAGNOSIS — N201 Calculus of ureter: Secondary | ICD-10-CM | POA: Diagnosis not present

## 2020-04-25 DIAGNOSIS — R531 Weakness: Secondary | ICD-10-CM | POA: Diagnosis present

## 2020-04-25 DIAGNOSIS — J690 Pneumonitis due to inhalation of food and vomit: Secondary | ICD-10-CM | POA: Diagnosis present

## 2020-04-25 DIAGNOSIS — J9621 Acute and chronic respiratory failure with hypoxia: Secondary | ICD-10-CM | POA: Diagnosis present

## 2020-04-25 DIAGNOSIS — R4702 Dysphasia: Secondary | ICD-10-CM | POA: Diagnosis present

## 2020-04-25 DIAGNOSIS — I10 Essential (primary) hypertension: Secondary | ICD-10-CM | POA: Diagnosis present

## 2020-04-25 DIAGNOSIS — E870 Hyperosmolality and hypernatremia: Secondary | ICD-10-CM | POA: Diagnosis present

## 2020-04-25 DIAGNOSIS — H9193 Unspecified hearing loss, bilateral: Secondary | ICD-10-CM | POA: Diagnosis present

## 2020-04-25 DIAGNOSIS — Z7189 Other specified counseling: Secondary | ICD-10-CM | POA: Diagnosis not present

## 2020-04-25 DIAGNOSIS — K219 Gastro-esophageal reflux disease without esophagitis: Secondary | ICD-10-CM | POA: Diagnosis present

## 2020-04-25 DIAGNOSIS — R17 Unspecified jaundice: Secondary | ICD-10-CM | POA: Diagnosis present

## 2020-04-25 DIAGNOSIS — E8770 Fluid overload, unspecified: Secondary | ICD-10-CM | POA: Diagnosis not present

## 2020-04-25 DIAGNOSIS — Z20822 Contact with and (suspected) exposure to covid-19: Secondary | ICD-10-CM | POA: Diagnosis present

## 2020-04-25 DIAGNOSIS — B9620 Unspecified Escherichia coli [E. coli] as the cause of diseases classified elsewhere: Secondary | ICD-10-CM | POA: Diagnosis present

## 2020-04-25 DIAGNOSIS — R4701 Aphasia: Secondary | ICD-10-CM | POA: Diagnosis present

## 2020-04-25 DIAGNOSIS — E871 Hypo-osmolality and hyponatremia: Secondary | ICD-10-CM | POA: Diagnosis present

## 2020-04-25 DIAGNOSIS — M069 Rheumatoid arthritis, unspecified: Secondary | ICD-10-CM | POA: Diagnosis present

## 2020-04-25 DIAGNOSIS — E876 Hypokalemia: Secondary | ICD-10-CM | POA: Diagnosis present

## 2020-04-25 DIAGNOSIS — R1310 Dysphagia, unspecified: Secondary | ICD-10-CM | POA: Diagnosis present

## 2020-04-25 DIAGNOSIS — N136 Pyonephrosis: Secondary | ICD-10-CM | POA: Diagnosis present

## 2020-04-25 DIAGNOSIS — Z515 Encounter for palliative care: Secondary | ICD-10-CM | POA: Diagnosis not present

## 2020-04-25 DIAGNOSIS — Z7401 Bed confinement status: Secondary | ICD-10-CM | POA: Diagnosis not present

## 2020-04-25 LAB — CBC WITH DIFFERENTIAL/PLATELET
Abs Immature Granulocytes: 0.06 10*3/uL (ref 0.00–0.07)
Basophils Absolute: 0 10*3/uL (ref 0.0–0.1)
Basophils Relative: 0 %
Eosinophils Absolute: 0 10*3/uL (ref 0.0–0.5)
Eosinophils Relative: 0 %
HCT: 38.6 % (ref 36.0–46.0)
Hemoglobin: 13.2 g/dL (ref 12.0–15.0)
Immature Granulocytes: 1 %
Lymphocytes Relative: 5 %
Lymphs Abs: 0.6 10*3/uL — ABNORMAL LOW (ref 0.7–4.0)
MCH: 30.5 pg (ref 26.0–34.0)
MCHC: 34.2 g/dL (ref 30.0–36.0)
MCV: 89.1 fL (ref 80.0–100.0)
Monocytes Absolute: 0.8 10*3/uL (ref 0.1–1.0)
Monocytes Relative: 6 %
Neutro Abs: 11.7 10*3/uL — ABNORMAL HIGH (ref 1.7–7.7)
Neutrophils Relative %: 88 %
Platelets: 160 10*3/uL (ref 150–400)
RBC: 4.33 MIL/uL (ref 3.87–5.11)
RDW: 13.6 % (ref 11.5–15.5)
WBC: 13.2 10*3/uL — ABNORMAL HIGH (ref 4.0–10.5)
nRBC: 0 % (ref 0.0–0.2)

## 2020-04-25 LAB — BLOOD GAS, ARTERIAL
Acid-base deficit: 0.4 mmol/L (ref 0.0–2.0)
Bicarbonate: 21.5 mmol/L (ref 20.0–28.0)
Expiratory PAP: 6
FIO2: 0.5
Inspiratory PAP: 12
Mode: POSITIVE
O2 Saturation: 99.7 %
Patient temperature: 37
RATE: 12 resp/min
pCO2 arterial: 27 mmHg — ABNORMAL LOW (ref 32.0–48.0)
pH, Arterial: 7.51 — ABNORMAL HIGH (ref 7.350–7.450)
pO2, Arterial: 193 mmHg — ABNORMAL HIGH (ref 83.0–108.0)

## 2020-04-25 LAB — COMPREHENSIVE METABOLIC PANEL
ALT: 27 U/L (ref 0–44)
AST: 26 U/L (ref 15–41)
Albumin: 3.2 g/dL — ABNORMAL LOW (ref 3.5–5.0)
Alkaline Phosphatase: 99 U/L (ref 38–126)
Anion gap: 13 (ref 5–15)
BUN: 19 mg/dL (ref 8–23)
CO2: 22 mmol/L (ref 22–32)
Calcium: 8.7 mg/dL — ABNORMAL LOW (ref 8.9–10.3)
Chloride: 100 mmol/L (ref 98–111)
Creatinine, Ser: <0.3 mg/dL — ABNORMAL LOW (ref 0.44–1.00)
Glucose, Bld: 103 mg/dL — ABNORMAL HIGH (ref 70–99)
Potassium: 3 mmol/L — ABNORMAL LOW (ref 3.5–5.1)
Sodium: 135 mmol/L (ref 135–145)
Total Bilirubin: 2.4 mg/dL — ABNORMAL HIGH (ref 0.3–1.2)
Total Protein: 6.9 g/dL (ref 6.5–8.1)

## 2020-04-25 LAB — HIV ANTIBODY (ROUTINE TESTING W REFLEX): HIV Screen 4th Generation wRfx: NONREACTIVE

## 2020-04-25 LAB — MAGNESIUM: Magnesium: 1.6 mg/dL — ABNORMAL LOW (ref 1.7–2.4)

## 2020-04-25 LAB — MRSA PCR SCREENING: MRSA by PCR: NEGATIVE

## 2020-04-25 MED ORDER — GLYCOPYRROLATE 0.2 MG/ML IJ SOLN
0.1000 mg | Freq: Two times a day (BID) | INTRAMUSCULAR | Status: DC
  Administered 2020-04-25 – 2020-05-03 (×15): 0.1 mg via INTRAVENOUS
  Filled 2020-04-25 (×10): qty 1
  Filled 2020-04-25 (×2): qty 0.5
  Filled 2020-04-25 (×5): qty 1

## 2020-04-25 MED ORDER — CHLORHEXIDINE GLUCONATE CLOTH 2 % EX PADS
6.0000 | MEDICATED_PAD | Freq: Every day | CUTANEOUS | Status: DC
  Administered 2020-04-26 – 2020-05-03 (×8): 6 via TOPICAL

## 2020-04-25 MED ORDER — MAGNESIUM SULFATE 2 GM/50ML IV SOLN
2.0000 g | Freq: Once | INTRAVENOUS | Status: AC
  Administered 2020-04-25: 2 g via INTRAVENOUS
  Filled 2020-04-25: qty 50

## 2020-04-25 MED ORDER — METOPROLOL TARTRATE 5 MG/5ML IV SOLN
5.0000 mg | INTRAVENOUS | Status: AC | PRN
  Administered 2020-04-25 – 2020-04-26 (×2): 5 mg via INTRAVENOUS
  Filled 2020-04-25 (×2): qty 5

## 2020-04-25 MED ORDER — SODIUM CHLORIDE 0.9 % IV SOLN: INTRAVENOUS | Status: DC

## 2020-04-25 MED ORDER — PROCHLORPERAZINE EDISYLATE 10 MG/2ML IJ SOLN
10.0000 mg | INTRAMUSCULAR | Status: DC | PRN
  Filled 2020-04-25: qty 2

## 2020-04-25 MED ORDER — SODIUM CHLORIDE 0.9 % IV SOLN
3.0000 g | Freq: Four times a day (QID) | INTRAVENOUS | Status: DC
  Administered 2020-04-25 – 2020-04-30 (×19): 3 g via INTRAVENOUS
  Filled 2020-04-25 (×7): qty 8
  Filled 2020-04-25: qty 3
  Filled 2020-04-25: qty 8
  Filled 2020-04-25: qty 3
  Filled 2020-04-25 (×3): qty 8
  Filled 2020-04-25 (×4): qty 3
  Filled 2020-04-25: qty 8
  Filled 2020-04-25 (×2): qty 3
  Filled 2020-04-25: qty 8
  Filled 2020-04-25 (×3): qty 3

## 2020-04-25 MED ORDER — MORPHINE SULFATE (PF) 2 MG/ML IV SOLN
2.0000 mg | INTRAVENOUS | Status: DC | PRN
  Administered 2020-04-25 – 2020-05-03 (×18): 2 mg via INTRAVENOUS
  Filled 2020-04-25 (×18): qty 1

## 2020-04-25 MED ORDER — POTASSIUM CHLORIDE 10 MEQ/100ML IV SOLN
10.0000 meq | INTRAVENOUS | Status: AC
  Administered 2020-04-25 (×5): 10 meq via INTRAVENOUS
  Filled 2020-04-25 (×6): qty 100

## 2020-04-25 NOTE — Progress Notes (Signed)
Pharmacy Antibiotic Note  Amy Gregory is a 64 y.o. female admitted on 04/24/2020 with aspiration pneumonia.  Pharmacy has been consulted for Unasyn dosing.  Patient does have a reported allergy of Rash with Cefdinir. No history of any reactions with a PCN.   Plan: Will start Unasyn 3g IV every 6 hours.   Height: 5\' 6"  (167.6 cm) Weight: 81 kg (178 lb 9.6 oz) IBW/kg (Calculated) : 59.3  Temp (24hrs), Avg:99.7 F (37.6 C), Min:99.7 F (37.6 C), Max:99.7 F (37.6 C)  Recent Labs  Lab 04/24/20 1640 04/25/20 0514  WBC 13.2* 13.2*  CREATININE 0.40* <0.30*    CrCl cannot be calculated (This lab value cannot be used to calculate CrCl because it is not a number: <0.30).    Allergies  Allergen Reactions  . Sulfa Antibiotics   . Cefdinir Rash  . Oxycodone Rash    PER PATIENT ON 7.25.18    Antimicrobials this admission: 12/18 levofloxacin >> x1  12/19 Unasyn  >>   Microbiology results: 12/18 BCx: pending  12/18 UCx: pending    Thank you for allowing pharmacy to be a part of this patient's care.  1/19, PharmD, BCPS Clinical Pharmacist 04/25/2020 8:50 AM

## 2020-04-25 NOTE — ED Notes (Addendum)
Bipap removed per order from attending. Nasal cannula applied at 4L/min. SPO2 currently 99%. Mouth swabbed per pt request. Pt denies pain but says that mouth is very dry. Moisturizer applied to lips.

## 2020-04-25 NOTE — ED Notes (Signed)
Attending provider at bedside

## 2020-04-25 NOTE — ED Notes (Signed)
Called RT to inform of ABG order.

## 2020-04-25 NOTE — ED Notes (Signed)
Oxygen flow rate turned from 3L per [place] to 2L per [place]. SPO2 remains 98-100%. Pt sleeping, husband at bedside.

## 2020-04-25 NOTE — ED Notes (Signed)
RT at bedside.

## 2020-04-25 NOTE — Progress Notes (Signed)
Pt. suctionned for mod. Amt. Of thin white secretions. Pt. Tolerated well.

## 2020-04-25 NOTE — ED Notes (Signed)
Asked attending provider if could forego second blood culture as first was drawn this morning and pt has been receiving IV antibiotics. Attending agrees that second culture can be discontinued.

## 2020-04-25 NOTE — ED Notes (Signed)
Performed oral suctioning again. Pt unable to cough up secretions. RT on way. SPO2 is 98% on 1L [place]. Pt in distress because of secretions and is saying "help". This RN staying in room, RT on way.

## 2020-04-25 NOTE — ED Notes (Signed)
Pt repeatedly saying she is having difficulty breathing.  Respiratory notified and evaluated pt.  ER MD Don Perking consulted and advised pt be placed on BIPap.  Pt placed on BiPap at this time and respiratory status noted to have improved.  Amy Schwartz, NP notified and will come to evaluate pt.

## 2020-04-25 NOTE — ED Notes (Signed)
RT performing suctioning.

## 2020-04-25 NOTE — ED Notes (Signed)
Pharmacy is sending more potassium per bags for ED pyxis. Will administer last dose once pyxis restocked.

## 2020-04-25 NOTE — Evaluation (Signed)
Physical Therapy Evaluation Patient Details Name: Amy Gregory MRN: [id number] DOB: 09-29-1955 Today's Date: 04/25/2020   History of Present Illness  64 yo female with ALS was admitted for PNA with aspiration suspected as source.  Pt is paralyzed in LE's at baseline, but now requires O2 4L, having diarrhea.  PMHx:  ALS, L foot drop, RA, HTN, B hearing loss, dysphagia, GERD, diverticulosis, memory changes, nephrolithiasis, OA, osteoporosis,  Clinical Impression  Pt was seen for test of ROM and strength of neck and LE's, noting dense weakness LE's and limited L SB to neutral and L rotation to 10 degrees.  Pt will work on recovery of head control, range of motion of neck and sitting balance control with better tolerance for being up in a wheelchair.  Hoyer lift is required for movement, and will continue to be the appropriate level of assistance needed.  Follow for goals of acute PT.    Follow Up Recommendations SNF    Equipment Recommendations  None recommended by PT    Recommendations for Other Services       Precautions / Restrictions Precautions Precautions: Fall Precaution Comments: hoyer at baseline home with husband Restrictions Weight Bearing Restrictions: No Other Position/Activity Restrictions: elevation of bed to avoid aspiration      Mobility  Bed Mobility Overal bed mobility: Needs Assistance Bed Mobility: Supine to Sit;Sit to Supine     Supine to sit: Max assist Sit to supine: Max assist   General bed mobility comments: max assist wtih support to neck    Transfers Overall transfer level: Needs assistance               General transfer comment: dependent  Ambulation/Gait                Stairs            Wheelchair Mobility    Modified Rankin (Stroke Patients Only)       Balance Overall balance assessment: Needs assistance Sitting-balance support: Bilateral upper extremity supported;Feet supported Sitting balance-Leahy Scale:  Poor                                       Pertinent Vitals/Pain Pain Assessment: No/denies pain    Home Living Family/patient expects to be discharged to:: Private residence Living Arrangements: Spouse/significant other Available Help at Discharge: Family;Available 24 hours/day Type of Home: House Home Access: Ramped entrance     Home Layout: One level Home Equipment: Hospital bed;Wheelchair - power;Other (comment) (Hoyer lift) Additional Comments: pt has been cared for by husband, has PT ordered as well    Prior Function Level of Independence: Needs assistance   Gait / Transfers Assistance Needed: mobile with hoyer to power wheelchair  ADL's / Homemaking Assistance Needed: has assistance for dressing and bathing        Hand Dominance   Dominant Hand:  (BUE weakness)    Extremity/Trunk Assessment   Upper Extremity Assessment Upper Extremity Assessment: Generalized weakness    Lower Extremity Assessment Lower Extremity Assessment: Generalized weakness    Cervical / Trunk Assessment Cervical / Trunk Assessment: Other exceptions (weakness on cervical spin) Cervical / Trunk Exceptions: struggling to control neck  Communication   Communication: Expressive difficulties  Cognition Arousal/Alertness: Awake/alert Behavior During Therapy: Flat affect Overall Cognitive Status: Difficult to assess  General Comments: pt is generally weak, no active LE movement observed and stiffness/weakness in cervical spine      General Comments General comments (skin integrity, edema, etc.): pt is up to center of bed with max assist, no control of neck to help counterbalance    Exercises     Assessment/Plan    PT Assessment Patient needs continued PT services  PT Problem List Decreased strength;Decreased range of motion;Decreased activity tolerance;Decreased balance;Decreased mobility;Decreased coordination;Decreased  safety awareness;Cardiopulmonary status limiting activity       PT Treatment Interventions DME instruction;Functional mobility training;Therapeutic activities;Therapeutic exercise;Balance training;Neuromuscular re-education;Patient/family education    PT Goals (Current goals can be found in the Care Plan section)  Acute Rehab PT Goals Patient Stated Goal: none stated PT Goal Formulation: With patient/family Time For Goal Achievement: 05/09/20 Potential to Achieve Goals: Good Additional Goals Additional Goal #1: Increase control of neck flexion and extension to 4/5    Frequency Min 2X/week   Barriers to discharge Decreased caregiver support requires total assist to move    Co-evaluation               AM-PAC PT "6 Clicks" Mobility  Outcome Measure Help needed turning from your back to your side while in a flat bed without using bedrails?: A Lot Help needed moving from lying on your back to sitting on the side of a flat bed without using bedrails?: A Lot Help needed moving to and from a bed to a chair (including a wheelchair)?: A Lot Help needed standing up from a chair using your arms (e.g., wheelchair or bedside chair)?: Total Help needed to walk in hospital room?: Total Help needed climbing 3-5 steps with a railing? : Total 6 Click Score: 9    End of Session Equipment Utilized During Treatment: Oxygen Activity Tolerance: Treatment limited secondary to medical complications (Comment) Patient left: in bed;with call bell/phone within reach;with bed alarm set;with family/visitor present Nurse Communication: Mobility status PT Visit Diagnosis: Unsteadiness on feet (R26.81);Muscle weakness (generalized) (M62.81);Adult, failure to thrive (R62.7)    Time: 3662-9476 PT Time Calculation (min) (ACUTE ONLY): 17 min   Charges:   PT Evaluation $PT Eval Moderate Complexity: 1 Mod         Ivar Drape 04/25/2020, 8:09 PM  Samul Dada, PT MS Acute Rehab Dept. Number: [facility]  [id number] and [facility] [phone]

## 2020-04-25 NOTE — ED Notes (Signed)
Attending provider requested for pt to be taken off bipap per ABG results. Called RT. Asked provider to discontinue order if pt is to be taken off bipap.

## 2020-04-25 NOTE — ED Notes (Signed)
Provided ice water per pt request. Husband at bedside.

## 2020-04-25 NOTE — ED Provider Notes (Signed)
Patient with a history of ALS here with aspiration pneumonia and a UTI.  Patient has been admitted by Dr. Lenard Lance before my shift started.  Unfortunately patient has been boarding in the emergency room due to lack of beds.  This morning I was asked by her nurse to come and evaluate her respiratory status in the room as well as deteriorating.  He was unsuccessful in reaching the admitting team.  Patient was with increased work of breathing, increased secretions, and becoming progressively more hypoxic.  Secretions were suctioned and patient was placed on BiPAP.  Review of chart shows that patient is a full code.  I was able to reach her husband over the phone who prefers to come into the hospital to talk to her before making any decisions about changing her CODE STATUS.  I have had the conversation of intubation and life support with the patient twice but at this point she is slightly delirious and cannot really fully comprehend what is going on.  Her respiratory status has improved significantly.  She is much more comfortable right now with her sats in the upper 90s.  Her husband said he would be here around 7:30 AM.  Until then patient is to remain full code.   Nita Sickle, MD 04/25/20 [phone]

## 2020-04-25 NOTE — Progress Notes (Signed)
PROGRESS NOTE    Amy Gregory  GGY:[id number] DOB: [birth year] DOA: 04/24/2020 PCP: Danelle Berry, PA-C   Chief Complain: Weakness, diarrhea  Brief Narrative: Patient is a 64 year old female with history of left foot drop, ALS, bilateral hearing loss, dysphagia, GERD, hiatal hernia, impaired memory, nephrolithiasis, osteoarthritis, rheumatoid arthritis who presents to the emergency room with complaints of generalized weakness, diarrhea, increased secretions and dysphagia.  Patient has a chronic indwelling Foley catheter.On presentation she was hypertensive.  She was noted to have hypokalemia.  Urinalysis was suspicious for UTI.  She had mild leukocytosis.  Chest x-ray also showed right lung base consolidation.  Assessment & Plan:   Principal Problem:   Aspiration pneumonia (HCC) Active Problems:   Rheumatoid arthritis (HCC)   Essential hypertension, benign   Amyotrophic lateral sclerosis (ALS) (HCC)   Hypokalemia   Hyperbilirubinemia   Acute on chronic respiratory failure with hypoxia (HCC)   Aspiration pneumonia: Chest x-ray showed right lung base  consolidation.  We will continue Unasyn.  We have  requested a speech therapy evaluation. She has mild leukocytosis.  Hypokalemia/Hypokalemia: Continue supplementation and monitoring  UTI: Urinalysis suspicious for UTI.  Follow-up urine culture.  Continue current antibiotics  Hypertension: Currently not on therapy at home.  BP currently stable  ALS: PT consulted.Follows   Supportive care.  She is bedbound and ambulates with the help of electric wheelchair.  Lives with her husband           DVT prophylaxis:Lovenox Code Status: DNR Family Communication: Husband at bedside Status is: Inpatient  Remains inpatient appropriate because:Inpatient level of care appropriate due to severity of illness   Dispo: The patient is from: Home              Anticipated d/c is to: Home              Anticipated d/c date is: 2 days               Patient currently is not medically stable to d/c.     Consultants: None  Procedures:None  Antimicrobials:  Anti-infectives (From admission, onward)   Start     Dose/Rate Route Frequency Ordered Stop   04/24/20 2200  metroNIDAZOLE (FLAGYL) IVPB 500 mg        500 mg 100 mL/hr over 60 Minutes Intravenous Every 8 hours 04/24/20 2152     04/24/20 1915  levofloxacin (LEVAQUIN) IVPB 750 mg        750 mg 100 mL/hr over 90 Minutes Intravenous  Once 04/24/20 1901 04/24/20 2219      Subjective:  Patient seen and examined at the bedside this morning.  Hemodynamically stable.  Weak.  She was not in any respiratory distress during my evaluation and was on BiPAP.  Objective: Vitals:   04/25/20 0600 04/25/20 0630 04/25/20 0700 04/25/20 0730  BP:  (!) 188/86 (!) 144/65 135/62  Pulse: (!) 106 (!) 108 80 74  Resp: (!) 33 (!) 24 20 (!) 22  Temp:      TempSrc:      SpO2: 90% 92% 98% 99%  Weight:      Height:        Intake/Output Summary (Last 24 hours) at 04/25/2020 0833 Last data filed at 04/25/2020 0350 Gross per 24 hour  Intake 940.63 ml  Output --  Net 940.63 ml   Filed Weights   04/24/20 1632  Weight: 81 kg    Examination:  General exam: Chronically ill looking female, weak, bedbound Respiratory system:  no wheezes or crackles  Cardiovascular system: S1 & S2 heard, RRR. No JVD, murmurs, rubs, gallops or clicks. No pedal edema. Gastrointestinal system: Abdomen is nondistended, soft and nontender. No organomegaly or masses felt. Normal bowel sounds heard. Central nervous system: Alert and awake,quadriplegia. Extremities: No edema, no clubbing ,no cyanosis Skin: No rashes, lesions or ulcers,no icterus ,no pallor   Data Reviewed: I have personally reviewed following labs and imaging studies  CBC: Recent Labs  Lab 04/24/20 1640 04/25/20 0514  WBC 13.2* 13.2*  NEUTROABS  --  11.7*  HGB 13.8 13.2  HCT 40.8 38.6  MCV 90.9 89.1  PLT 164 160   Basic Metabolic  Panel: Recent Labs  Lab 04/24/20 1640 04/25/20 0514  NA 135 135  K 3.0* 3.0*  CL 97* 100  CO2 26 22  GLUCOSE 119* 103*  BUN 20 19  CREATININE 0.40* <0.30*  CALCIUM 8.7* 8.7*   GFR: CrCl cannot be calculated (This lab value cannot be used to calculate CrCl because it is not a number: <0.30). Liver Function Tests: Recent Labs  Lab 04/24/20 1640 04/25/20 0514  AST 29 26  ALT 34 27  ALKPHOS 97 99  BILITOT 2.6* 2.4*  PROT 6.9 6.9  ALBUMIN 3.5 3.2*   No results for input(s): LIPASE, AMYLASE in the last 168 hours. No results for input(s): AMMONIA in the last 168 hours. Coagulation Profile: No results for input(s): INR, PROTIME in the last 168 hours. Cardiac Enzymes: No results for input(s): CKTOTAL, CKMB, CKMBINDEX, TROPONINI in the last 168 hours. BNP (last 3 results) No results for input(s): PROBNP in the last 8760 hours. HbA1C: No results for input(s): HGBA1C in the last 72 hours. CBG: No results for input(s): GLUCAP in the last 168 hours. Lipid Profile: No results for input(s): CHOL, HDL, LDLCALC, TRIG, CHOLHDL, LDLDIRECT in the last 72 hours. Thyroid Function Tests: No results for input(s): TSH, T4TOTAL, FREET4, T3FREE, THYROIDAB in the last 72 hours. Anemia Panel: No results for input(s): VITAMINB12, FOLATE, FERRITIN, TIBC, IRON, RETICCTPCT in the last 72 hours. Sepsis Labs: No results for input(s): PROCALCITON, LATICACIDVEN in the last 168 hours.  Recent Results (from the past 240 hour(s))  Resp Panel by RT-PCR (Flu A&B, Covid) Nasopharyngeal Swab     Status: None   Collection Time: 04/24/20  4:40 PM   Specimen: Nasopharyngeal Swab; Nasopharyngeal(NP) swabs in vial transport medium  Result Value Ref Range Status   SARS Coronavirus 2 by RT PCR NEGATIVE NEGATIVE Final    Comment: (NOTE) SARS-CoV-2 target nucleic acids are NOT DETECTED.  The SARS-CoV-2 RNA is generally detectable in upper respiratory specimens during the acute phase of infection. The  lowest concentration of SARS-CoV-2 viral copies this assay can detect is 138 copies/mL. A negative result does not preclude SARS-Cov-2 infection and should not be used as the sole basis for treatment or other patient management decisions. A negative result may occur with  improper specimen collection/handling, submission of specimen other than nasopharyngeal swab, presence of viral mutation(s) within the areas targeted by this assay, and inadequate number of viral copies(<138 copies/mL). A negative result must be combined with clinical observations, patient history, and epidemiological information. The expected result is Negative.  Fact Sheet for Patients:  BloggerCourse.com  Fact Sheet for Healthcare Providers:  SeriousBroker.it  This test is no t yet approved or cleared by the [place] FDA and  has been authorized for detection and/or diagnosis of SARS-CoV-2 by FDA under an Emergency Use Authorization (EUA). This EUA will remain  in effect (meaning this test can be used) for the duration of the COVID-19 declaration under Section 564(b)(1) of the Act, 21 U.S.C.section 360bbb-3(b)(1), unless the authorization is terminated  or revoked sooner.       Influenza A by PCR NEGATIVE NEGATIVE Final   Influenza B by PCR NEGATIVE NEGATIVE Final    Comment: (NOTE) The Xpert Xpress SARS-CoV-2/FLU/RSV plus assay is intended as an aid in the diagnosis of influenza from Nasopharyngeal swab specimens and should not be used as a sole basis for treatment. Nasal washings and aspirates are unacceptable for Xpert Xpress SARS-CoV-2/FLU/RSV testing.  Fact Sheet for Patients: BloggerCourse.com  Fact Sheet for Healthcare Providers: SeriousBroker.it  This test is not yet approved or cleared by the [place] FDA and has been authorized for detection and/or diagnosis of SARS-CoV-2 by FDA under  an Emergency Use Authorization (EUA). This EUA will remain in effect (meaning this test can be used) for the duration of the COVID-19 declaration under Section 564(b)(1) of the Act, 21 U.S.C. section 360bbb-3(b)(1), unless the authorization is terminated or revoked.  Performed at [facility], [address], [place], [place] [address]          Radiology Studies: DG Chest Portable 1 View  Result Date: 04/24/2020 CLINICAL DATA:  Weakness.  Patient with history of ALS. EXAM: PORTABLE CHEST 1 VIEW COMPARISON:  October 16, 2016 FINDINGS: Opacity in the medial right lung base. The lungs are otherwise clear. No pneumothorax. The heart, hila, and mediastinum are normal. No other acute abnormalities. IMPRESSION: Opacity in the medial right lung base could represent atelectasis or infiltrate such as pneumonia or aspiration. Recommend clinical correlation and short-term follow-up imaging to ensure resolution. Electronically Signed   By: Gerome Sam III M.D   On: 04/24/2020 17:06        Scheduled Meds: . enoxaparin (LOVENOX) injection  40 mg Subcutaneous Q24H  . glycopyrrolate  0.1 mg Intravenous Once   Continuous Infusions: . metronidazole Stopped (04/25/20 0651)  . potassium chloride       LOS: 0 days    Time spent: 35 mins.More than 50% of that time was spent in counseling and/or coordination of care.      Burnadette Pop, MD [facility] P12/19/2021, 8:33 AM

## 2020-04-25 NOTE — ED Notes (Signed)
Performed oral suctioning per pt request.

## 2020-04-25 NOTE — ED Notes (Signed)
Husband at bedside with pt. EDP at bedside speaking with husband and pt.

## 2020-04-25 NOTE — ED Notes (Addendum)
Pt requested ice water sips. Provided ice water for husband to give sips of water. Pt denies further needs at this time. Resting in bed.

## 2020-04-25 NOTE — ED Notes (Signed)
Performed more oral suction on pt for a few minutes.

## 2020-04-25 NOTE — ED Notes (Signed)
Oxygen decreased to 1L/min per [place]. Pt remains 99% SPO2.

## 2020-04-25 NOTE — Progress Notes (Signed)
Swing shift floor coverage note.   Amy Gregory HQI:[id number] DOB: December 26, 1955 DOA: 04/24/2020  The patient was evaluated after her husband asked the staff to reverse her DNR status to full code.  I spoke to the patient and her husband in their ED room about the request.  I explained to them in simple terms that the natural progression of ALS will eventually cause diaphragmatic weakness and this in turn will cause natural death.  I also explained to them that if the patient has to be intubated  she would eventually require a tracheostomy and a PEG tube.  There was some hesitation as to how to proceed since he seemed like the patient wanted a feeding tube, but was not initially inclined to tracheostomy/mechanical ventilation.  It is difficult to understand the patient's wishes due to her inability to speak clearly the stage in her ALS.  I explained the need for these procedures several times given the nature of the disease.  However, although hesitant, she was subsequently able to signal that she would be in agreement to proceed with full code and eventual procedures at this time.  We agreed to revisit the CODE STATUS again in the morning.  Past Medical History:  Diagnosis Date  . Acquired left foot drop 02/22/2017  . ALS (amyotrophic lateral sclerosis) (HCC)   . Bilateral hearing loss   . Dysphagia   . GERD (gastroesophageal reflux disease)   . Hiatal hernia    small  . History of diverticulitis   . Impaired memory   . Nephrolithiasis   . OA (osteoarthritis)   . Osteoporosis 02/07/2017  . Rheumatoid arthritis (HCC)    managed by Dr. Gavin Potters    Past Surgical History:  Procedure Laterality Date  . ABDOMINAL HYSTERECTOMY  2002   for fibroids, non cancerous reasons, ovaries remain  . BREAST BIOPSY Left 2005-ish   benign  . CATARACT EXTRACTION  2014   OU  . COLONOSCOPY  04/26/04  . ESOPHAGEAL DILATION    . ESOPHAGOGASTRODUODENOSCOPY  02/01/05 and 07/13/14  . INTRAMEDULLARY (IM) NAIL  INTERTROCHANTERIC Right 10/17/2016   Procedure: INTRAMEDULLARY (IM) NAIL INTERTROCHANTRIC;  Surgeon: Christena Flake, MD;  Location: [facility] ORS;  Service: Orthopedics;  Laterality: Right;  . KNEE SURGERY Right 03/2014   partial medial and lateral meniscectomy and medial condryle debridement  . LEG SURGERY  2013   fractured left leg, toes, hospitalized for 2 weeks  . ORIF TIBIA FRACTURE     remote ORIF proximal left tiba fracture    Social History  reports that she quit smoking about 51 years ago. Her smoking use included cigarettes. She started smoking about 54 years ago. She has a 3.00 pack-year smoking history. She has never used smokeless tobacco. She reports that she does not drink alcohol and does not use drugs.  Allergies  Allergen Reactions  . Sulfa Antibiotics   . Cefdinir Rash  . Oxycodone Rash    PER PATIENT ON 7.25.18    Family History  Problem Relation Age of Onset  . Heart disease Mother   . Heart attack Mother   . Rheum arthritis Mother   . Stroke Mother   . Arthritis Mother        RA  . Hypertension Mother   . Stroke Father   . Diabetes Father   . Cancer Sister        lymphoma  . Heart attack Maternal Uncle   . Cancer Maternal Grandmother  leukemia  . Arthritis Maternal Grandmother   . Depression Maternal Grandfather   . Stroke Paternal Grandmother   . Cancer Paternal Grandfather   . Arthritis Sister        rheumatitis  . Thyroid disease Sister   . Hypertension Sister   . Cancer Sister        lung  . Depression Sister   . Breast cancer Paternal Aunt 65  . Kidney cancer Neg Hx   . Bladder Cancer Neg Hx    Prior to Admission medications   Medication Sig Start Date End Date Taking? Authorizing Provider  Ascorbic Acid (VITAMIN C) 100 MG tablet Take 100 mg by mouth daily. Patient not taking: Reported on 04/24/2020    [provider]  cholecalciferol (VITAMIN D) 1000 units tablet Take 1,000 Units by mouth daily. Patient not taking: Reported  on 04/24/2020    [provider]  gabapentin (NEURONTIN) 100 MG capsule Take 300 mg by mouth at bedtime.  Patient not taking: Reported on 04/24/2020    [provider]  hydroxychloroquine (PLAQUENIL) 200 MG tablet Take 200 mg by mouth daily. Patient not taking: Reported on 04/24/2020    [provider]  Misc Natural Products (ADV TURMERIC CURCUMIN COMPLEX PO) Take 600 mg by mouth daily. Patient not taking: Reported on 04/24/2020    [provider]  Multiple Vitamin (MULTIVITAMIN) capsule Take by mouth. Patient not taking: Reported on 04/24/2020    [provider]  pantoprazole (PROTONIX) 40 MG tablet TAKE 1 TABLET BY MOUTH ONCE DAILY. Patient not taking: Reported on 04/24/2020 05/12/15   [provider]  riluzole (RILUTEK) 50 MG tablet Take 50 mg by mouth every 12 (twelve) hours. Patient not taking: Reported on 04/24/2020    [provider]    Physical Exam: Vitals:   04/25/20 1515 04/25/20 1630 04/25/20 1700 04/25/20 1930  BP: 132/63 (!) 163/89 (!) 160/78 (!) 152/65  Pulse: 78 (!) 120 (!) 109 76  Resp: (!) 21 (!) 22 (!) 22 18  Temp: 98.4 F (36.9 C)   98.8 F (37.1 C)  TempSrc: Oral   Oral  SpO2: 99% 97% 97% 100%  Weight:      Height:        Constitutional: NAD, calm, comfortable Eyes: PERRL, lids and conjunctivae mildly injected.  ENMT: Mucous membranes are dry. Posterior pharynx clear of any exudate or lesions. Neck: normal, supple, no masses, no thyromegaly Respiratory: Decreased breath sounds on bases, no wheezing, no crackles. Normal respiratory effort. No accessory muscle use.  Cardiovascular: Regular rate and rhythm, no murmurs / rubs / gallops. No extremity edema. 2+ pedal pulses. No carotid bruits.  Abdomen: Obese, nondistended. Bowel sounds positive.  No tenderness, no masses palpated. No hepatosplenomegaly.  Musculoskeletal: no clubbing / cyanosis.  Good ROM, no contractures. Normal muscle tone.  Skin: no  rashes, lesions, ulcers on very limited dermatological examination. Neurologic: CN 2-12 grossly intact.  Functional quadriplegia. Psychiatric: Normal judgment and insight. Alert and oriented x 3. Normal mood.   Labs on Admission: I have personally reviewed following labs and imaging studies  CBC: Recent Labs  Lab 04/24/20 1640 04/25/20 0514  WBC 13.2* 13.2*  NEUTROABS  --  11.7*  HGB 13.8 13.2  HCT 40.8 38.6  MCV 90.9 89.1  PLT 164 160    Basic Metabolic Panel: Recent Labs  Lab 04/24/20 1640 04/25/20 0514  NA 135 135  K 3.0* 3.0*  CL 97* 100  CO2 26 22  GLUCOSE 119* 103*  BUN 20 19  CREATININE 0.40* <0.30*  CALCIUM 8.7* 8.7*  MG  --  1.6*    GFR: CrCl cannot be calculated (This lab value cannot be used to calculate CrCl because it is not a number: <0.30).  Liver Function Tests: Recent Labs  Lab 04/24/20 1640 04/25/20 0514  AST 29 26  ALT 34 27  ALKPHOS 97 99  BILITOT 2.6* 2.4*  PROT 6.9 6.9  ALBUMIN 3.5 3.2*    Radiological Exams on Admission: DG Chest Portable 1 View  Result Date: 04/24/2020 CLINICAL DATA:  Weakness.  Patient with history of ALS. EXAM: PORTABLE CHEST 1 VIEW COMPARISON:  October 16, 2016 FINDINGS: Opacity in the medial right lung base. The lungs are otherwise clear. No pneumothorax. The heart, hila, and mediastinum are normal. No other acute abnormalities. IMPRESSION: Opacity in the medial right lung base could represent atelectasis or infiltrate such as pneumonia or aspiration. Recommend clinical correlation and short-term follow-up imaging to ensure resolution. Electronically Signed   By: Gerome Sam III M.D   On: 04/24/2020 17:06   Assessment/Plan Principal Problem:   Aspiration pneumonia (HCC)   Amyotrophic lateral sclerosis (ALS) (HCC) CODE STATUS changed back to full code. This will be revisited tomorrow. Continue current therapy. Palliative care consult was requested.  Sanda Klein, MD

## 2020-04-25 NOTE — ED Notes (Signed)
Oxygen turned to 3L per [place] from 4 L per [place]. SPO2 remains 98-100%.

## 2020-04-26 ENCOUNTER — Inpatient Hospital Stay: Payer: Medicare HMO

## 2020-04-26 DIAGNOSIS — Z7189 Other specified counseling: Secondary | ICD-10-CM

## 2020-04-26 DIAGNOSIS — Z515 Encounter for palliative care: Secondary | ICD-10-CM

## 2020-04-26 LAB — CBC WITH DIFFERENTIAL/PLATELET
Abs Immature Granulocytes: 0.08 10*3/uL — ABNORMAL HIGH (ref 0.00–0.07)
Basophils Absolute: 0 10*3/uL (ref 0.0–0.1)
Basophils Relative: 0 %
Eosinophils Absolute: 0 10*3/uL (ref 0.0–0.5)
Eosinophils Relative: 0 %
HCT: 39.4 % (ref 36.0–46.0)
Hemoglobin: 13.8 g/dL (ref 12.0–15.0)
Immature Granulocytes: 1 %
Lymphocytes Relative: 5 %
Lymphs Abs: 0.8 10*3/uL (ref 0.7–4.0)
MCH: 31.3 pg (ref 26.0–34.0)
MCHC: 35 g/dL (ref 30.0–36.0)
MCV: 89.3 fL (ref 80.0–100.0)
Monocytes Absolute: 1.4 10*3/uL — ABNORMAL HIGH (ref 0.1–1.0)
Monocytes Relative: 8 %
Neutro Abs: 14 10*3/uL — ABNORMAL HIGH (ref 1.7–7.7)
Neutrophils Relative %: 86 %
Platelets: 182 10*3/uL (ref 150–400)
RBC: 4.41 MIL/uL (ref 3.87–5.11)
RDW: 14.3 % (ref 11.5–15.5)
WBC: 16.4 10*3/uL — ABNORMAL HIGH (ref 4.0–10.5)
nRBC: 0 % (ref 0.0–0.2)

## 2020-04-26 LAB — BASIC METABOLIC PANEL
Anion gap: 14 (ref 5–15)
BUN: 18 mg/dL (ref 8–23)
CO2: 19 mmol/L — ABNORMAL LOW (ref 22–32)
Calcium: 8.5 mg/dL — ABNORMAL LOW (ref 8.9–10.3)
Chloride: 106 mmol/L (ref 98–111)
Creatinine, Ser: <0.3 mg/dL — ABNORMAL LOW (ref 0.44–1.00)
Glucose, Bld: 86 mg/dL (ref 70–99)
Potassium: 3.2 mmol/L — ABNORMAL LOW (ref 3.5–5.1)
Sodium: 139 mmol/L (ref 135–145)

## 2020-04-26 LAB — GLUCOSE, CAPILLARY
Glucose-Capillary: 82 mg/dL (ref 70–99)
Glucose-Capillary: 85 mg/dL (ref 70–99)
Glucose-Capillary: 91 mg/dL (ref 70–99)

## 2020-04-26 LAB — PHOSPHORUS: Phosphorus: 1.6 mg/dL — ABNORMAL LOW (ref 2.5–4.6)

## 2020-04-26 MED ORDER — LABETALOL HCL 5 MG/ML IV SOLN
10.0000 mg | INTRAVENOUS | Status: DC | PRN
  Administered 2020-04-26 – 2020-05-01 (×6): 10 mg via INTRAVENOUS
  Filled 2020-04-26 (×6): qty 4

## 2020-04-26 MED ORDER — DILTIAZEM HCL 25 MG/5ML IV SOLN
10.0000 mg | Freq: Once | INTRAVENOUS | Status: AC
  Administered 2020-04-26: 10 mg via INTRAVENOUS
  Filled 2020-04-26: qty 5

## 2020-04-26 MED ORDER — DIGOXIN 0.25 MG/ML IJ SOLN
0.2500 mg | Freq: Once | INTRAMUSCULAR | Status: AC
  Administered 2020-04-26: 0.25 mg via INTRAVENOUS
  Filled 2020-04-26: qty 2

## 2020-04-26 MED ORDER — DEXTROSE-NACL 5-0.9 % IV SOLN: INTRAVENOUS | Status: DC

## 2020-04-26 MED ORDER — POTASSIUM CHLORIDE 10 MEQ/100ML IV SOLN
10.0000 meq | INTRAVENOUS | Status: AC
  Administered 2020-04-26 (×4): 10 meq via INTRAVENOUS
  Filled 2020-04-26 (×4): qty 100

## 2020-04-26 MED ORDER — POTASSIUM PHOSPHATES 15 MMOLE/5ML IV SOLN
30.0000 mmol | Freq: Once | INTRAVENOUS | Status: AC
  Administered 2020-04-26: 30 mmol via INTRAVENOUS
  Filled 2020-04-26: qty 10

## 2020-04-26 MED ORDER — LABETALOL HCL 5 MG/ML IV SOLN: 10.0000 mg | INTRAVENOUS | Status: DC | PRN

## 2020-04-26 MED ORDER — HYDRALAZINE HCL 20 MG/ML IJ SOLN
10.0000 mg | INTRAMUSCULAR | Status: DC | PRN
  Filled 2020-04-26: qty 1

## 2020-04-26 MED ORDER — LABETALOL HCL 5 MG/ML IV SOLN
INTRAVENOUS | Status: AC
  Filled 2020-04-26: qty 4

## 2020-04-26 NOTE — Progress Notes (Signed)
PROGRESS NOTE    Amy Gregory  ZOX:[id number] DOB: Feb 11, 1956 DOA: 04/24/2020 PCP: Danelle Berry, PA-C   Chief Complain: Weakness, diarrhea  Brief Narrative: Patient is a 64 year old female with history of left foot drop, ALS, bilateral hearing loss, dysphagia, GERD, hiatal hernia, impaired memory, nephrolithiasis, osteoarthritis, rheumatoid arthritis who presents to the emergency room with complaints of generalized weakness, diarrhea, increased secretions and dysphagia.  Patient has a chronic indwelling Foley catheter.On presentation she was hypertensive.  She was noted to have hypokalemia.  Urinalysis was suspicious for UTI.  She had mild leukocytosis.  Chest x-ray also showed right lung base consolidation. Palliative care consulted for goals of care.  Husband is interested to talk about goals of care/residential hospice.  Assessment & Plan:   Principal Problem:   Aspiration pneumonia (HCC) Active Problems:   Rheumatoid arthritis (HCC)   Essential hypertension, benign   Amyotrophic lateral sclerosis (ALS) (HCC)   Hypokalemia   Hyperbilirubinemia   Acute on chronic respiratory failure with hypoxia (HCC)   Aspiration pneumonia: Chest x-ray showed right lung base  consolidation.  We will continue Unasyn.  We have  requested a speech therapy evaluation who recommended keeping NPO for severe dysphagia. She has mild leukocytosis. We discussed about initiation of artificial feedings for short-term.  Patient and husband both deny feeding tube or PEG.  Continue D5 infusion for now.  Hypokalemia/Hypokalemia: Continue supplementation and monitoring  UTI: Urinalysis suspicious for UTI.  Follow-up urine culture.  Continue current antibiotics  Hypertension: Currently not on therapy at home.  BP currently stable  ALS: PT consulted and recommended to skilled nursing facility on discharge.  Continue supportive care.  She is bedbound and ambulates with the help of electric wheelchair.  Lives  with her husband  Goals of care: Extremely poor prognosis with end-stage ALS now has severe dysphagia, unable to tolerate food or drink.  Patient and husband not sure about inserting feeding tube or PEG.  She is a DNR.  I have requested for palliative care evaluation.  She might be a candidate for residential hospice.  Husband is interested to learn about that and leaning towards hospice.           DVT prophylaxis:Lovenox Code Status: DNR Family Communication: Husband on phone on 04/26/20 Status is: Inpatient  Remains inpatient appropriate because:Inpatient level of care appropriate due to severity of illness   Dispo: The patient is from: Home              Anticipated d/c is to: Residential hospice              Anticipated d/c date is: 2 days              Patient currently is not medically stable to d/c.     Consultants: None  Procedures:None  Antimicrobials:  Anti-infectives (From admission, onward)   Start     Dose/Rate Route Frequency Ordered Stop   04/25/20 0900  Ampicillin-Sulbactam (UNASYN) 3 g in sodium chloride 0.9 % 100 mL IVPB        3 g 200 mL/hr over 30 Minutes Intravenous Every 6 hours 04/25/20 0848     04/24/20 2200  metroNIDAZOLE (FLAGYL) IVPB 500 mg  Status:  Discontinued        500 mg 100 mL/hr over 60 Minutes Intravenous Every 8 hours 04/24/20 2152 04/25/20 0838   04/24/20 1915  levofloxacin (LEVAQUIN) IVPB 750 mg        750 mg 100 mL/hr over 90 Minutes Intravenous  Once 04/24/20 1901 04/24/20 2219      Subjective:  Patient seen and examined at the bedside this morning.  Hemodynamically stable during my evaluation.  She was hardly able to speak.  Has persistent dysphagia.  Does not look in acute distress  Objective: Vitals:   04/26/20 0600 04/26/20 0700 04/26/20 0800 04/26/20 0811  BP: (!) 189/62 (!) 176/60 (!) 181/64 (!) 181/64  Pulse: 69 72 86   Resp: (!) 22 19 19    Temp:    98.8 F (37.1 C)  TempSrc:    Axillary  SpO2: 96% 97% 98%    Weight:      Height:        Intake/Output Summary (Last 24 hours) at 04/26/2020 0859 Last data filed at 04/26/2020 0600 Gross per 24 hour  Intake 1949.96 ml  Output 200 ml  Net 1749.96 ml   Filed Weights   04/24/20 1632  Weight: 81 kg    Examination:   General exam: Chronically ill looking female, weak, bedbound Respiratory system: Bilateral equal air entry, normal vesicular breath sounds, no wheezes or crackles  Cardiovascular system: S1 & S2 heard, RRR. No JVD, murmurs, rubs, gallops or clicks. Gastrointestinal system: Abdomen is nondistended, soft and nontender. No organomegaly or masses felt. Normal bowel sounds heard. Central nervous system: Alert and awake, orientation could not be evaluated, quadriplegia extremities: No edema, no clubbing ,no cyanosis Skin: No rashes, lesions or ulcers,no icterus ,no pallor   Data Reviewed: I have personally reviewed following labs and imaging studies  CBC: Recent Labs  Lab 04/24/20 1640 04/25/20 0514 04/26/20 0340  WBC 13.2* 13.2* 16.4*  NEUTROABS  --  11.7* 14.0*  HGB 13.8 13.2 13.8  HCT 40.8 38.6 39.4  MCV 90.9 89.1 89.3  PLT 164 160 182   Basic Metabolic Panel: Recent Labs  Lab 04/24/20 1640 04/25/20 0514 04/26/20 0340  NA 135 135 139  K 3.0* 3.0* 3.2*  CL 97* 100 106  CO2 26 22 19*  GLUCOSE 119* 103* 86  BUN 20 19 18   CREATININE 0.40* <0.30* <0.30*  CALCIUM 8.7* 8.7* 8.5*  MG  --  1.6*  --    GFR: CrCl cannot be calculated (This lab value cannot be used to calculate CrCl because it is not a number: <0.30). Liver Function Tests: Recent Labs  Lab 04/24/20 1640 04/25/20 0514  AST 29 26  ALT 34 27  ALKPHOS 97 99  BILITOT 2.6* 2.4*  PROT 6.9 6.9  ALBUMIN 3.5 3.2*   No results for input(s): LIPASE, AMYLASE in the last 168 hours. No results for input(s): AMMONIA in the last 168 hours. Coagulation Profile: No results for input(s): INR, PROTIME in the last 168 hours. Cardiac Enzymes: No results for  input(s): CKTOTAL, CKMB, CKMBINDEX, TROPONINI in the last 168 hours. BNP (last 3 results) No results for input(s): PROBNP in the last 8760 hours. HbA1C: No results for input(s): HGBA1C in the last 72 hours. CBG: Recent Labs  Lab 04/26/20 0805  GLUCAP 85   Lipid Profile: No results for input(s): CHOL, HDL, LDLCALC, TRIG, CHOLHDL, LDLDIRECT in the last 72 hours. Thyroid Function Tests: No results for input(s): TSH, T4TOTAL, FREET4, T3FREE, THYROIDAB in the last 72 hours. Anemia Panel: No results for input(s): VITAMINB12, FOLATE, FERRITIN, TIBC, IRON, RETICCTPCT in the last 72 hours. Sepsis Labs: No results for input(s): PROCALCITON, LATICACIDVEN in the last 168 hours.  Recent Results (from the past 240 hour(s))  Resp Panel by RT-PCR (Flu A&B, Covid) Nasopharyngeal Swab  Status: None   Collection Time: 04/24/20  4:40 PM   Specimen: Nasopharyngeal Swab; Nasopharyngeal(NP) swabs in vial transport medium  Result Value Ref Range Status   SARS Coronavirus 2 by RT PCR NEGATIVE NEGATIVE Final    Comment: (NOTE) SARS-CoV-2 target nucleic acids are NOT DETECTED.  The SARS-CoV-2 RNA is generally detectable in upper respiratory specimens during the acute phase of infection. The lowest concentration of SARS-CoV-2 viral copies this assay can detect is 138 copies/mL. A negative result does not preclude SARS-Cov-2 infection and should not be used as the sole basis for treatment or other patient management decisions. A negative result may occur with  improper specimen collection/handling, submission of specimen other than nasopharyngeal swab, presence of viral mutation(s) within the areas targeted by this assay, and inadequate number of viral copies(<138 copies/mL). A negative result must be combined with clinical observations, patient history, and epidemiological information. The expected result is Negative.  Fact Sheet for Patients:  BloggerCourse.com  Fact  Sheet for Healthcare Providers:  SeriousBroker.it  This test is no t yet approved or cleared by the [place] FDA and  has been authorized for detection and/or diagnosis of SARS-CoV-2 by FDA under an Emergency Use Authorization (EUA). This EUA will remain  in effect (meaning this test can be used) for the duration of the COVID-19 declaration under Section 564(b)(1) of the Act, 21 U.S.C.section 360bbb-3(b)(1), unless the authorization is terminated  or revoked sooner.       Influenza A by PCR NEGATIVE NEGATIVE Final   Influenza B by PCR NEGATIVE NEGATIVE Final    Comment: (NOTE) The Xpert Xpress SARS-CoV-2/FLU/RSV plus assay is intended as an aid in the diagnosis of influenza from Nasopharyngeal swab specimens and should not be used as a sole basis for treatment. Nasal washings and aspirates are unacceptable for Xpert Xpress SARS-CoV-2/FLU/RSV testing.  Fact Sheet for Patients: BloggerCourse.com  Fact Sheet for Healthcare Providers: SeriousBroker.it  This test is not yet approved or cleared by the [place] FDA and has been authorized for detection and/or diagnosis of SARS-CoV-2 by FDA under an Emergency Use Authorization (EUA). This EUA will remain in effect (meaning this test can be used) for the duration of the COVID-19 declaration under Section 564(b)(1) of the Act, 21 U.S.C. section 360bbb-3(b)(1), unless the authorization is terminated or revoked.  Performed at [facility], [address] [place], [place] [address]   Urine culture     Status: Abnormal (Preliminary result)   Collection Time: 04/24/20  5:12 PM   Specimen: Urine, Random  Result Value Ref Range Status   Specimen Description   Final    URINE, RANDOM Performed at [facility], [address], [place], [place] [address]    Special Requests   Final    NONE Performed at [facility], [address] [place], [place] [address]    Culture >=100,000 COLONIES/mL GRAM NEGATIVE RODS (A)  Final   Report Status PENDING  Incomplete  MRSA PCR Screening     Status: None   Collection Time: 04/25/20 10:26 PM   Specimen: Nasopharyngeal  Result Value Ref Range Status   MRSA by PCR NEGATIVE NEGATIVE Final    Comment:        The GeneXpert MRSA Assay (FDA approved for NASAL specimens only), is one component of a comprehensive MRSA colonization surveillance program. It is not intended to diagnose MRSA infection nor to guide or monitor treatment for MRSA infections. Performed at [facility], [address] [place], [place] [address]          Radiology Studies: DG Chest Portable 1 View  Result Date: 04/24/2020 CLINICAL DATA:  Weakness.  Patient with history of ALS. EXAM: PORTABLE CHEST 1 VIEW COMPARISON:  October 16, 2016 FINDINGS: Opacity in the medial right lung base. The lungs are otherwise clear. No pneumothorax. The heart, hila, and mediastinum are normal. No other acute abnormalities. IMPRESSION: Opacity in the medial right lung base could represent atelectasis or infiltrate such as pneumonia or aspiration. Recommend clinical correlation and short-term follow-up imaging to ensure resolution. Electronically Signed   By: Gerome Sam III M.D   On: 04/24/2020 17:06        Scheduled Meds: . Chlorhexidine Gluconate Cloth  6 each Topical Daily  . enoxaparin (LOVENOX) injection  40 mg Subcutaneous Q24H  . glycopyrrolate  0.1 mg Intravenous BID   Continuous Infusions: . sodium chloride 100 mL/hr at 04/25/20 1955  . ampicillin-sulbactam (UNASYN) IV Stopped (04/26/20 0322)  . potassium chloride       LOS: 1 day    Time spent: 35 mins.More than 50% of that time was spent in counseling and/or coordination of care.      Burnadette Pop, MD [facility] P12/20/2021, 8:59 AM

## 2020-04-26 NOTE — Evaluation (Signed)
Clinical/Bedside Swallow Evaluation Patient Details  Name: Amy Gregory MRN: [id number] Date of Birth: Feb 05, 1956  Today's Date: 04/26/2020 Time: SLP Start Time (ACUTE ONLY): 4098 SLP Stop Time (ACUTE ONLY): 0953 SLP Time Calculation (min) (ACUTE ONLY): 20 min  Past Medical History:  Past Medical History:  Diagnosis Date  . Acquired left foot drop 02/22/2017  . ALS (amyotrophic lateral sclerosis) (HCC)   . Bilateral hearing loss   . Dysphagia   . GERD (gastroesophageal reflux disease)   . Hiatal hernia    small  . History of diverticulitis   . Impaired memory   . Nephrolithiasis   . OA (osteoarthritis)   . Osteoporosis 02/07/2017  . Rheumatoid arthritis (HCC)    managed by Dr. Gavin Potters   Past Surgical History:  Past Surgical History:  Procedure Laterality Date  . ABDOMINAL HYSTERECTOMY  2002   for fibroids, non cancerous reasons, ovaries remain  . BREAST BIOPSY Left 2005-ish   benign  . CATARACT EXTRACTION  2014   OU  . COLONOSCOPY  04/26/04  . ESOPHAGEAL DILATION    . ESOPHAGOGASTRODUODENOSCOPY  02/01/05 and 07/13/14  . INTRAMEDULLARY (IM) NAIL INTERTROCHANTERIC Right 10/17/2016   Procedure: INTRAMEDULLARY (IM) NAIL INTERTROCHANTRIC;  Surgeon: Christena Flake, MD;  Location: [facility] ORS;  Service: Orthopedics;  Laterality: Right;  . KNEE SURGERY Right 03/2014   partial medial and lateral meniscectomy and medial condryle debridement  . LEG SURGERY  2013   fractured left leg, toes, hospitalized for 2 weeks  . ORIF TIBIA FRACTURE     remote ORIF proximal left tiba fracture   HPI:  Pt is a 64 y/o female experiencing the end stages of ALS who presented to the ED 04/24/2020 d/t SOB, difficulty with secretion managment d/t inability to swallow. Chest x-ray revealed opacity in the medial right lung base could represent atelectasis or infiltrate such as pneumonia or aspiration. Recommend clinical correlation and short-term follow-up imaging to ensure resolution. Esophagram ordered to  assess for any structural issues but was not able to be completed as pt was not able to produce a swallow.   Assessment / Plan / Recommendation Clinical Impression  Pt's speech is unintelligible and she is not able to express her wants and needs. Even with maximal questions (yes/no) and use of environment, pt was not able to provide an intelligible response to indicate wants and needs. Pt unable to keep head upright even with the assistance of multiple pillows.   Pt presents with profound sensorimotor oropharyngeal dysphagia and is at a very risk of aspiration. When consuming ice chips, pt had only a trace oral motor response and a trace superior movement when palpating her hyoid. Pt indicated that she had swallowed despite any lack of movement or visualization of pharyngeal movement. Pt was free of overt s/s of aspiration therefore suspect a sensory component in addition to a motor component. At this time, recommend NPO with strict oral care to reduce bacterial load of pt's own salvia. At this point, prognosis for improvement is very poor. While a PEG is certainly a possible means of enteral nutrition, a it WILL NOT PREVENT ASPIRATION OF PT'S OWN SALVIA and thus a PEG WILL NOT PREVENT FURTHER HOSPITALIZATIONS FOR ASPIRATION PNEUMONIA. lt not prevent aspiration of pt's salvia and thus a PEG will not prevent further hospitalizations for aspiration pneumonia. SLP Visit Diagnosis: Dysphagia, oropharyngeal phase (R13.12)    Aspiration Risk  Severe aspiration risk;Risk for inadequate nutrition/hydration    Diet Recommendation NPO   Medication Administration: Via  alternative means    Other  Recommendations Oral Care Recommendations: Oral care QID Other Recommendations: Prohibited food (jello, ice cream, thin soups);Remove water pitcher;Have oral suction available   Follow up Recommendations  (Palliative Care and Hospice)      Frequency and Duration   N/A         Prognosis Prognosis for Safe Diet  Advancement:  (poor) Barriers to Reach Goals:  (end stage ALS)      Swallow Study   General Date of Onset: 04/25/20 HPI: Pt is a 64 y/o female experiencing the end stages of ALS who presented to the ED 04/24/2020 d/t SOB, difficulty with secretion managment d/t inability to swallow. Chest x-ray revealed opacity in the medial right lung base could represent atelectasis or infiltrate such as pneumonia or aspiration. Recommend clinical correlation and short-term follow-up imaging to ensure resolution. Esophagram ordered to assess for any structural issues but was not able to be completed as pt was not able to produce a swallow. Type of Study: Bedside Swallow Evaluation Previous Swallow Assessment: has received ST services uncertain if it included a swallow assessment Diet Prior to this Study:  (clear liquids) Temperature Spikes Noted: No Respiratory Status: Room air History of Recent Intubation: No Behavior/Cognition: Alert (she appeared very sad) Oral Cavity Assessment: Within Functional Limits Oral Care Completed by SLP: Recent completion by staff Oral Cavity - Dentition: Adequate natural dentition Self-Feeding Abilities: Total assist Patient Positioning: Upright in bed Baseline Vocal Quality: Low vocal intensity Volitional Cough:  (inability to cough d/t severity of neuromuscular deficits) Volitional Swallow:  (unable d/t severity of neuromuscular deficits)    Oral/Motor/Sensory Function Overall Oral Motor/Sensory Function: Severe impairment (all oral motor abilities impaired d/t end stage ALS)   Ice Chips Ice chips: Impaired Presentation: Spoon Oral Phase Impairments: Reduced lingual movement/coordination;Impaired mastication;Reduced labial seal Oral Phase Functional Implications: Prolonged oral transit;Oral holding Pharyngeal Phase Impairments: Unable to trigger swallow   Thin Liquid Thin Liquid: Not tested    Nectar Thick Nectar Thick Liquid: Not tested   Honey Thick Honey Thick  Liquid: Not tested   Puree Puree: Not tested   Solid     Solid: Not tested     Amy Gregory B. Dreama Saa, M.S., CCC-SLP, [facility] Speech-Language Pathologist Rehabilitation Services Office [phone]  Rikita Grabert Dreama Saa 04/26/2020,12:42 PM

## 2020-04-26 NOTE — Progress Notes (Signed)
Spoke with husband via telephone concerning NGT placement and code status. He tells this RN that hospice is involved in care at home. He states he would like to change her code status back to DNR. He also states he wants her to be comfortable and does not want the NGT placed at this time. MD notified, will continue to monitor.

## 2020-04-26 NOTE — Progress Notes (Signed)
SLP Cancellation Note  Patient Details Name: Amy Gregory MRN: [id number] DOB: 1955/07/23   Cancelled treatment:       Reason Eval/Treat Not Completed: Patient at procedure or test/unavailable   Pt out of room at esophagram with orders for dilation at this time.   ST will reattempt at next available time.   [name] B. Dreama Saa M.S., CCC-SLP, [facility] Speech-Language Pathologist Rehabilitation Services Office [phone]   [name] Dreama Saa 04/26/2020, 9:20 AM

## 2020-04-27 ENCOUNTER — Inpatient Hospital Stay: Payer: Medicare HMO | Admitting: Anesthesiology

## 2020-04-27 ENCOUNTER — Encounter
Admission: EM | Disposition: A | Discharge status: Home Health | Payer: Self-pay | Source: Home / Self Care | Attending: Internal Medicine

## 2020-04-27 ENCOUNTER — Inpatient Hospital Stay: Payer: Medicare HMO

## 2020-04-27 ENCOUNTER — Telehealth: Payer: Self-pay | Admitting: Family Medicine

## 2020-04-27 DIAGNOSIS — N201 Calculus of ureter: Secondary | ICD-10-CM

## 2020-04-27 DIAGNOSIS — A419 Sepsis, unspecified organism: Secondary | ICD-10-CM

## 2020-04-27 HISTORY — PX: CYSTOSCOPY/URETEROSCOPY/HOLMIUM LASER/STENT PLACEMENT: SHX6546

## 2020-04-27 LAB — CBC WITH DIFFERENTIAL/PLATELET
Abs Immature Granulocytes: 0.1 10*3/uL — ABNORMAL HIGH (ref 0.00–0.07)
Basophils Absolute: 0.1 10*3/uL (ref 0.0–0.1)
Basophils Relative: 0 %
Eosinophils Absolute: 0.1 10*3/uL (ref 0.0–0.5)
Eosinophils Relative: 0 %
HCT: 36.1 % (ref 36.0–46.0)
Hemoglobin: 12.3 g/dL (ref 12.0–15.0)
Immature Granulocytes: 1 %
Lymphocytes Relative: 9 %
Lymphs Abs: 1.4 10*3/uL (ref 0.7–4.0)
MCH: 30.2 pg (ref 26.0–34.0)
MCHC: 34.1 g/dL (ref 30.0–36.0)
MCV: 88.7 fL (ref 80.0–100.0)
Monocytes Absolute: 1.4 10*3/uL — ABNORMAL HIGH (ref 0.1–1.0)
Monocytes Relative: 9 %
Neutro Abs: 12.7 10*3/uL — ABNORMAL HIGH (ref 1.7–7.7)
Neutrophils Relative %: 81 %
Platelets: 176 10*3/uL (ref 150–400)
RBC: 4.07 MIL/uL (ref 3.87–5.11)
RDW: 14.6 % (ref 11.5–15.5)
WBC: 15.7 10*3/uL — ABNORMAL HIGH (ref 4.0–10.5)
nRBC: 0 % (ref 0.0–0.2)

## 2020-04-27 LAB — BASIC METABOLIC PANEL
Anion gap: 12 (ref 5–15)
BUN: 14 mg/dL (ref 8–23)
CO2: 20 mmol/L — ABNORMAL LOW (ref 22–32)
Calcium: 8.5 mg/dL — ABNORMAL LOW (ref 8.9–10.3)
Chloride: 110 mmol/L (ref 98–111)
Creatinine, Ser: 0.35 mg/dL — ABNORMAL LOW (ref 0.44–1.00)
GFR, Estimated: >60 mL/min (ref >60)
Glucose, Bld: 118 mg/dL — ABNORMAL HIGH (ref 70–99)
Potassium: 4 mmol/L (ref 3.5–5.1)
Sodium: 142 mmol/L (ref 135–145)

## 2020-04-27 LAB — GLUCOSE, CAPILLARY
Glucose-Capillary: 145 mg/dL — ABNORMAL HIGH (ref 70–99)
Glucose-Capillary: 132 mg/dL — ABNORMAL HIGH (ref 70–99)
Glucose-Capillary: 117 mg/dL — ABNORMAL HIGH (ref 70–99)

## 2020-04-27 LAB — URINE CULTURE: Culture: >=100000 — AB

## 2020-04-27 LAB — MAGNESIUM
Magnesium: 1.8 mg/dL (ref 1.7–2.4)
Magnesium: 1.7 mg/dL (ref 1.7–2.4)

## 2020-04-27 LAB — PHOSPHORUS
Phosphorus: 3.6 mg/dL (ref 2.5–4.6)
Phosphorus: 2.4 mg/dL — ABNORMAL LOW (ref 2.5–4.6)

## 2020-04-27 SURGERY — CYSTOSCOPY/URETEROSCOPY/HOLMIUM LASER/STENT PLACEMENT
Anesthesia: Monitor Anesthesia Care | Laterality: Left

## 2020-04-27 MED ORDER — ESMOLOL HCL 100 MG/10ML IV SOLN
INTRAVENOUS | Status: AC
  Filled 2020-04-27: qty 10

## 2020-04-27 MED ORDER — ESMOLOL HCL 100 MG/10ML IV SOLN
INTRAVENOUS | Status: DC | PRN
  Administered 2020-04-27: 30 mg via INTRAVENOUS

## 2020-04-27 MED ORDER — FENTANYL CITRATE (PF) 100 MCG/2ML IJ SOLN: 25.0000 ug | INTRAMUSCULAR | Status: DC | PRN

## 2020-04-27 MED ORDER — MIDAZOLAM HCL 2 MG/2ML IJ SOLN
INTRAMUSCULAR | Status: AC
  Filled 2020-04-27: qty 2

## 2020-04-27 MED ORDER — VITAL HIGH PROTEIN PO LIQD: 1000.0000 mL | ORAL | Status: DC

## 2020-04-27 MED ORDER — PROPOFOL 10 MG/ML IV BOLUS
INTRAVENOUS | Status: AC
  Filled 2020-04-27: qty 20

## 2020-04-27 MED ORDER — MIDAZOLAM HCL 2 MG/2ML IJ SOLN
INTRAMUSCULAR | Status: DC | PRN
  Administered 2020-04-27 (×2): 1 mg via INTRAVENOUS

## 2020-04-27 MED ORDER — FENTANYL CITRATE (PF) 100 MCG/2ML IJ SOLN
INTRAMUSCULAR | Status: AC
  Filled 2020-04-27: qty 2

## 2020-04-27 MED ORDER — FENTANYL CITRATE (PF) 100 MCG/2ML IJ SOLN
INTRAMUSCULAR | Status: DC | PRN
  Administered 2020-04-27 (×2): 50 ug via INTRAVENOUS

## 2020-04-27 MED ORDER — ONDANSETRON HCL 4 MG/2ML IJ SOLN: 4.0000 mg | Freq: Once | INTRAMUSCULAR | Status: DC | PRN

## 2020-04-27 MED ORDER — IOHEXOL 180 MG/ML  SOLN
INTRAMUSCULAR | Status: DC | PRN
  Administered 2020-04-27: 20 mL

## 2020-04-27 MED ORDER — LACTATED RINGERS IV SOLN: INTRAVENOUS | Status: DC | PRN

## 2020-04-27 MED ORDER — JEVITY 1.5 CAL/FIBER PO LIQD: 1000.0000 mL | ORAL | Status: DC

## 2020-04-27 MED ORDER — ACETAMINOPHEN 10 MG/ML IV SOLN: 1000.0000 mg | Freq: Once | INTRAVENOUS | Status: DC | PRN

## 2020-04-27 SURGICAL SUPPLY — 28 items
BAG DRAIN CYSTO-URO LG1000N (MISCELLANEOUS) IMPLANT
BRUSH SCRUB EZ 1% IODOPHOR (MISCELLANEOUS) ×2 IMPLANT
CATH URETL 5X70 OPEN END (CATHETERS) IMPLANT
CNTNR SPEC 2.5X3XGRAD LEK (MISCELLANEOUS)
CONT SPEC 4OZ STER OR WHT (MISCELLANEOUS)
CONTAINER SPEC 2.5X3XGRAD LEK (MISCELLANEOUS) IMPLANT
DRAPE UTILITY 15X26 TOWEL STRL (DRAPES) ×2 IMPLANT
DRSG TEGADERM 2-3/8X2-3/4 SM (GAUZE/BANDAGES/DRESSINGS) IMPLANT
GLOVE BIOGEL PI IND STRL 7.5 (GLOVE) ×1 IMPLANT
GLOVE BIOGEL PI INDICATOR 7.5 (GLOVE) ×1
GOWN STRL REUS W/ TWL LRG LVL3 (GOWN DISPOSABLE) ×1 IMPLANT
GOWN STRL REUS W/ TWL XL LVL3 (GOWN DISPOSABLE) ×1 IMPLANT
GOWN STRL REUS W/TWL LRG LVL3 (GOWN DISPOSABLE) ×1
GOWN STRL REUS W/TWL XL LVL3 (GOWN DISPOSABLE) ×1
GUIDEWIRE STR DUAL SENSOR (WIRE) ×2 IMPLANT
INFUSOR MANOMETER BAG 3000ML (MISCELLANEOUS) IMPLANT
INTRODUCER DILATOR DOUBLE (INTRODUCER) IMPLANT
KIT TURNOVER CYSTO (KITS) ×2 IMPLANT
PACK CYSTO AR (MISCELLANEOUS) ×2 IMPLANT
SET CYSTO W/LG BORE CLAMP LF (SET/KITS/TRAYS/PACK) ×2 IMPLANT
SHEATH URETERAL 12FRX35CM (MISCELLANEOUS) IMPLANT
SOL .9 NS 3000ML IRR  AL (IV SOLUTION) ×1
SOL .9 NS 3000ML IRR UROMATIC (IV SOLUTION) ×1 IMPLANT
STENT URO INLAY 6FRX26CM (STENTS) ×2 IMPLANT
SURGILUBE 2OZ TUBE FLIPTOP (MISCELLANEOUS) ×2 IMPLANT
SYR 10ML LL (SYRINGE) ×2 IMPLANT
VALVE UROSEAL ADJ ENDO (VALVE) IMPLANT
WATER STERILE IRR 1000ML POUR (IV SOLUTION) ×2 IMPLANT

## 2020-04-27 NOTE — Anesthesia Postprocedure Evaluation (Signed)
Anesthesia Post Note  Patient: Amy Gregory  Procedure(s) Performed: CYSTOSCOPY/URETEROSCOPY/STENT PLACEMENT (Left )  Patient location during evaluation: PACU Anesthesia Type: MAC Level of consciousness: awake and alert Pain management: pain level controlled Vital Signs Assessment: post-procedure vital signs reviewed and stable Respiratory status: spontaneous breathing, nonlabored ventilation, respiratory function stable and patient connected to nasal cannula oxygen Cardiovascular status: stable and blood pressure returned to baseline Postop Assessment: no apparent nausea or vomiting Anesthetic complications: no   No complications documented.   Last Vitals:  Vitals:   04/27/20 2030 04/27/20 2045  BP: (!) 179/79 (!) 186/84  Pulse: 84 93  Resp: 20 20  Temp:  (!) 36.2 C  SpO2: 100% 97%    Last Pain:  Vitals:   04/27/20 2045  TempSrc:   PainSc: 0-No pain                 Arita Miss

## 2020-04-27 NOTE — Progress Notes (Signed)
Initial Nutrition Assessment  DOCUMENTATION CODES:   Not applicable  INTERVENTION:   Once NGT placed and confirmed, recommend:  - Initiate Jevity 1.5 cal at 20 mL/hr and advance by 20 mL/hr q 12 hours until goal rate of 60 mL/hr.   - Provides 2160 kcal, 92 grams protein, 1094 mL H2O. reccommend free water flush 120 mL q 4 hours, which provides a total of 1814 mL H2O daily including water in TF.   Once G-tube placed and ready to use recommend transitioning to bolus regimen:  -Day 1: provide Jevity 1.5 cal 1/2 carton (120 mL) 6 times daily per tube.  -Day 2: Advance to goal regimen of Jevity 1.5 cal 1 carton (237 mL) 6 times daily per tube.  -Goal regimen provides 2130 kcal, 91 grams protein, 1080 mL H2O -Recommend flushing with 60 mL before and after each bolus feeding which provides 1800 H2O daily including water in TF.    Patient is at risk for refeeding syndrome. Monitor magnesium, phosphorus, and potassium twice daily while advancing to goal. MD to replace as needed.   NUTRITION DIAGNOSIS:   Inadequate oral intake related to inability to eat,dysphagia as evidenced by NPO status.   GOAL:   Patient will meet greater than or equal to 90% of their needs   MONITOR:   TF tolerance,Labs,Weight trends,I & O's  REASON FOR ASSESSMENT:   Consult Enteral/tube feeding initiation and management  ASSESSMENT:   64 y.o. female with medical history significant of acquired left foot drop, ALS, bilateral hearing loss, dysphagia, GERD, hiatal hernia, diverticulosis/diverticulitis, impaired memory, nephrolithiasis, osteoarthritis, osteoporosis, rheumatoid arthritis who is coming to the emergency department due to generalized weakness, associated with diarrhea, increased secretions and inability to swallow safely.  Spoke with pt. Husband was at bedside. Pt's speech is unintelligible. Husband was able to provide some answers during RD visit.   Husband reports pt's swallowing ability has  decreased severely over the last month. He states it has been a week since her last good meal. Per SLP, recommend that pt remains NPO d/t increased aspiration risk.   Pt stated she has not lost weight. She stated her usual weight is between 170-180 lbs. Per chart review, pt has not had significant changes in her weight but she has moderate fluid accumulation. Unable to determine dry weight at this time.   Husband states that she is wheelchair bound for 1 year.   Pt discussed with palliative medicine and would like to proceed with G-tube placement Per RN, plan is currently for NG tube placement today to hold pt over until G-tube is placed.   Labs reviewed: CO2 20, CBG's x 24 hours: 91-145  Medications reviewed and include: Lovenox, Robinul   NUTRITION - FOCUSED PHYSICAL EXAM:  Flowsheet Row Most Recent Value  Orbital Region Mild depletion  Upper Arm Region No depletion  Thoracic and Lumbar Region No depletion  Buccal Region No depletion  Temple Region Mild depletion  Clavicle Bone Region Moderate depletion  Clavicle and Acromion Bone Region Moderate depletion  Scapular Bone Region Unable to assess  Dorsal Hand No depletion  Patellar Region Unable to assess  Anterior Thigh Region Unable to assess  Posterior Calf Region Unable to assess  Edema (RD Assessment) Moderate  Hair Reviewed  Eyes Reviewed  Mouth Unable to assess  Skin Reviewed  Nails Reviewed       Diet Order:   Diet Order            Diet NPO time specified  Diet effective now                 EDUCATION NEEDS:   No education needs have been identified at this time  Skin:  Skin Assessment: Reviewed RN Assessment  Last BM:  04/25/20  Height:   Ht Readings from Last 1 Encounters:  04/24/20 5\' 6"  (1.676 m)    Weight:   Wt Readings from Last 1 Encounters:  04/24/20 81 kg    Ideal Body Weight:     BMI:  Body mass index is 28.83 kg/m.  Estimated Nutritional Needs:   Kcal:  1900-2100  kcal  Protein:  95-105 grams  Fluid:  1.8-2.0 L/day    04/26/20, Dietetic Intern Pager: [phone] If unavailable: [phone]

## 2020-04-27 NOTE — Progress Notes (Signed)
[facility] Liaison note:  New referral for [facility] outpatient palliative to follow at home post discharge received from [facility]. Patient information given to referral.  Dayna Barker BSN, RN, [facility] Liaison [facility] [phone]

## 2020-04-27 NOTE — Consult Note (Signed)
Consultation Note Date: 04/27/2020   Patient Name: Amy Gregory  DOB: 1955-11-18  MRN: [id number]  Age / Sex: 64 y.o., female  PCP: Danelle Berry, PA-C Referring Physician: Burnadette Pop, MD  Reason for Consultation: Establishing goals of care  HPI/Patient Profile: JOELI FENNER is a 64 y.o. female with medical history significant of acquired left foot drop, ALS, bilateral hearing loss, dysphagia, GERD, hiatal hernia, diverticulosis/diverticulitis, impaired memory, nephrolithiasis, osteoarthritis, osteoporosis, rheumatoid arthritis who is coming to the emergency department due to generalized weakness, associated with diarrhea, increased secretions and inability to swallow safely.   Clinical Assessment and Goals of Care: Patient is resting in bed. Husband is at bedside. He discusses her diagnosis of ALS in 2019, and her slow steady decline until around 3 months ago. He states she has become weaker.  He states hospice has assessed her for candidacy multiple times and did not feel she was ready. He states he does not feel she would want a feeding tube, ventilator support, or CPR. Discussed this with patient, and she nods when discussing a comfort focus, and nodded when discussing a continued aggressive path. Husband notes that she has been nodding and saying yes to any question asked.  Patient attempts to speak, and it is difficult for husband and I to understand.  She states she wants to live for her grandchildren. She states she would want a feeding tube to try to extend her life.   Discussed that we could speak again tomorrow.      SUMMARY OF RECOMMENDATIONS    Will speak further tomorrow.       Primary Diagnoses: Present on Admission: . Aspiration pneumonia (HCC) . Rheumatoid arthritis (HCC) . Essential hypertension, benign . Amyotrophic lateral sclerosis (ALS) (HCC) . Hypokalemia .  Hyperbilirubinemia . Acute on chronic respiratory failure with hypoxia (HCC)   I have reviewed the medical record, interviewed the patient and family, and examined the patient. The following aspects are pertinent.  Past Medical History:  Diagnosis Date  . Acquired left foot drop 02/22/2017  . ALS (amyotrophic lateral sclerosis) (HCC)   . Bilateral hearing loss   . Dysphagia   . GERD (gastroesophageal reflux disease)   . Hiatal hernia    small  . History of diverticulitis   . Impaired memory   . Nephrolithiasis   . OA (osteoarthritis)   . Osteoporosis 02/07/2017  . Rheumatoid arthritis (HCC)    managed by Dr. Gavin Potters   Social History   Socioeconomic History  . Marital status: Married    Spouse name: Not on file  . Number of children: Not on file  . Years of education: Not on file  . Highest education level: Not on file  Occupational History  . Not on file  Tobacco Use  . Smoking status: Former Smoker    Packs/day: 1.00    Years: 3.00    Pack years: 3.00    Types: Cigarettes    Start date: 05/08/1966    Quit date: 05/08/1969    Years since  quitting: 51.0  . Smokeless tobacco: Never Used  Vaping Use  . Vaping Use: Never used  Substance and Sexual Activity  . Alcohol use: No    Alcohol/week: 0.0 standard drinks  . Drug use: No  . Sexual activity: Yes    Birth control/protection: Post-menopausal  Other Topics Concern  . Not on file  Social History Narrative  . Not on file   Social Determinants of Health   Financial Resource Strain: Not on file  Food Insecurity: Not on file  Transportation Needs: Not on file  Physical Activity: Not on file  Stress: Not on file  Social Connections: Not on file   Family History  Problem Relation Age of Onset  . Heart disease Mother   . Heart attack Mother   . Rheum arthritis Mother   . Stroke Mother   . Arthritis Mother        RA  . Hypertension Mother   . Stroke Father   . Diabetes Father   . Cancer Sister         lymphoma  . Heart attack Maternal Uncle   . Cancer Maternal Grandmother        leukemia  . Arthritis Maternal Grandmother   . Depression Maternal Grandfather   . Stroke Paternal Grandmother   . Cancer Paternal Grandfather   . Arthritis Sister        rheumatitis  . Thyroid disease Sister   . Hypertension Sister   . Cancer Sister        lung  . Depression Sister   . Breast cancer Paternal Aunt 56  . Kidney cancer Neg Hx   . Bladder Cancer Neg Hx    Scheduled Meds: . Chlorhexidine Gluconate Cloth  6 each Topical Daily  . enoxaparin (LOVENOX) injection  40 mg Subcutaneous Q24H  . glycopyrrolate  0.1 mg Intravenous BID   Continuous Infusions: . ampicillin-sulbactam (UNASYN) IV 3 g (04/27/20 1042)  . dextrose 5 % and 0.9% NaCl 75 mL/hr at 04/27/20 0523   PRN Meds:.hydrALAZINE, labetalol, morphine injection, prochlorperazine Medications Prior to Admission:  Prior to Admission medications   Medication Sig Start Date End Date Taking? Authorizing Provider  Ascorbic Acid (VITAMIN C) 100 MG tablet Take 100 mg by mouth daily. Patient not taking: Reported on 04/24/2020    [provider]  cholecalciferol (VITAMIN D) 1000 units tablet Take 1,000 Units by mouth daily. Patient not taking: Reported on 04/24/2020    [provider]  gabapentin (NEURONTIN) 100 MG capsule Take 300 mg by mouth at bedtime.  Patient not taking: Reported on 04/24/2020    [provider]  hydroxychloroquine (PLAQUENIL) 200 MG tablet Take 200 mg by mouth daily. Patient not taking: Reported on 04/24/2020    [provider]  Misc Natural Products (ADV TURMERIC CURCUMIN COMPLEX PO) Take 600 mg by mouth daily. Patient not taking: Reported on 04/24/2020    [provider]  Multiple Vitamin (MULTIVITAMIN) capsule Take by mouth. Patient not taking: Reported on 04/24/2020    [provider]  pantoprazole (PROTONIX) 40 MG tablet TAKE 1 TABLET BY MOUTH ONCE DAILY. Patient  not taking: Reported on 04/24/2020 05/12/15   [provider]  riluzole (RILUTEK) 50 MG tablet Take 50 mg by mouth every 12 (twelve) hours. Patient not taking: Reported on 04/24/2020    [provider]   Allergies  Allergen Reactions  . Sulfa Antibiotics   . Cefdinir Rash  . Oxycodone Rash    PER PATIENT ON 7.25.18  Review of Systems  All other systems reviewed and are negative.   Physical Exam Pulmonary:     Effort: Pulmonary effort is normal.  Neurological:     Mental Status: She is alert.     Vital Signs: BP (!) 159/76 (BP Location: Right Arm)   Pulse 84   Temp (!) 97.5 F (36.4 C) (Oral)   Resp 18   Ht 5\' 6"  (1.676 m)   Wt 81 kg   SpO2 96%   BMI 28.83 kg/m  Pain Scale: 0-10 POSS *See Group Information*: S-Acceptable,Sleep, easy to arouse Pain Score: 0-No pain   SpO2: SpO2: 96 % O2 Device:SpO2: 96 % O2 Flow Rate: .O2 Flow Rate (L/min): 1 L/min  IO: Intake/output summary:   Intake/Output Summary (Last 24 hours) at 04/27/2020 1058 Last data filed at 04/26/2020 1536 Gross per 24 hour  Intake 529.59 ml  Output --  Net 529.59 ml    LBM: Last BM Date: 04/25/20 Baseline Weight: Weight: 81 kg Most recent weight: Weight: 81 kg     Palliative Assessment/Data:     Time In: 3:40 Time Out: 4:10 Time Total: 30 min Greater than 50%  of this time was spent counseling and coordinating care related to the above assessment and plan.  Signed by: Morton Stall, NP   Please contact Palliative Medicine Team phone at [phone] for questions and concerns.  For individual provider: See Loretha Stapler

## 2020-04-27 NOTE — Progress Notes (Addendum)
Daily Progress Note   Patient Name: Amy Gregory       Date: 04/27/2020 DOB: 1955-07-03  Age: 64 y.o. MRN#: [id number] Attending Physician: Burnadette Pop, MD Primary Care Physician: Danelle Berry, PA-C Admit Date: 04/24/2020  Reason for Consultation/Follow-up: Establishing goals of care  Subjective: Patient is resting in bed. Speech is more clear at this time. She states she remembers our conversation from yesterday. She states she does want a PEG which has been discussed with her previously. She states her mouth gets dry, and as long as her mouth can be moistened, she is okay with being NPO. She wants the PEG so she can have time with her grandchild whom she has never seen. She states she has spoken with her ALS providers about various interventions and risks of them given her ALS. She states she is willing to take the risk of death with receiving the intervention as she knows she will die without it. She states she just wants to be home for Christmas.   She confirms DNR/DNI. I completed a MOST form today and the signed original was placed in the chart. A photocopy was placed in the chart to be scanned into EMR. The patient outlined their wishes for the following treatment decisions:  Cardiopulmonary Resuscitation: Do Not Attempt Resuscitation (DNR/No CPR)  Medical Interventions: Limited Additional Interventions: Use medical treatment, IV fluids and cardiac monitoring as indicated, DO NOT USE intubation or mechanical ventilation. May consider use of less invasive airway support such as BiPAP or CPAP. Also provide comfort measures. Transfer to the hospital if indicated. Avoid intensive care.   Antibiotics: Antibiotics if indicated  IV Fluids: IV fluids if indicated  Feeding Tube: Feeding tube  long-term if indicated    Length of Stay: 2  Current Medications: Scheduled Meds:  . Chlorhexidine Gluconate Cloth  6 each Topical Daily  . enoxaparin (LOVENOX) injection  40 mg Subcutaneous Q24H  . glycopyrrolate  0.1 mg Intravenous BID    Continuous Infusions: . ampicillin-sulbactam (UNASYN) IV 3 g (04/27/20 1042)  . dextrose 5 % and 0.9% NaCl 75 mL/hr at 04/27/20 0523    PRN Meds: hydrALAZINE, labetalol, morphine injection, prochlorperazine  Physical Exam Pulmonary:     Effort: Pulmonary effort is normal.  Neurological:     Mental Status: She is alert.  Vital Signs: BP (!) 186/74 (BP Location: Left Arm)   Pulse 90   Temp (!) 97.5 F (36.4 C)   Resp 16   Ht 5\' 6"  (1.676 m)   Wt 81 kg   SpO2 98%   BMI 28.83 kg/m  SpO2: SpO2: 98 % O2 Device: O2 Device: Room Air O2 Flow Rate: O2 Flow Rate (L/min): 1 L/min  Intake/output summary:   Intake/Output Summary (Last 24 hours) at 04/27/2020 1350 Last data filed at 04/26/2020 1536 Gross per 24 hour  Intake 529.59 ml  Output --  Net 529.59 ml   LBM: Last BM Date: 04/25/20 Baseline Weight: Weight: 81 kg Most recent weight: Weight: 81 kg       Palliative Assessment/Data:      Patient Active Problem List   Diagnosis Date Noted  . Acute on chronic respiratory failure with hypoxia (HCC) 04/25/2020  . Aspiration pneumonia (HCC) 04/24/2020  . Hypokalemia 04/24/2020  . Hyperbilirubinemia 04/24/2020  . Amyotrophic lateral sclerosis (ALS) (HCC) 03/03/2019  . Medication monitoring encounter 07/23/2017  . Essential hypertension, benign 07/09/2017  . Elevated glucose 07/09/2017  . Pessary maintenance 04/03/2017  . Acquired left foot drop 02/22/2017  . Cystocele, midline 02/08/2017  . Vaginal atrophy 02/08/2017  . Menopause 02/08/2017  . Status post laparoscopic supracervical hysterectomy 02/08/2017  . Nocturia 02/08/2017  . Urinary incontinence without sensory awareness 02/08/2017  . Osteoporosis  02/07/2017  . Closed displaced spiral fracture of shaft of right femur (HCC) 10/16/2016  . Status post total right knee replacement using cement 08/22/2016  . 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 Left leg weakness 02/03/2016  . Rheumatoid arthritis (HCC) 01/20/2016  . Vitamin D deficiency 07/13/2015  . Preventative health care 07/13/2015  . Right upper quadrant abdominal pain 07/13/2015  . Bilateral hearing loss   . Dysphagia   . Impaired memory   . History of diverticulitis   . Arthritis of knee, degenerative 03/04/2015  . Primary osteoarthritis of left knee 03/04/2015  . Gonalgia 02/18/2015  . Can't get food down 07/02/2014  . History of arthroscopy of knee 05/07/2014  . Calculus of kidney 10/02/2013  . Arthritis, degenerative 08/21/2013    Palliative Care Assessment & Plan    Recommendations/Plan: Recommend PEG placement. DNR/DNI.  Recommend palliative at D/C.    Code Status:    Code Status Orders  (From admission, onward)         Start     Ordered   04/26/20 1720  Do not attempt resuscitation (DNR)  Continuous       Question Answer Comment  In the event of cardiac or respiratory ARREST Do not call a "code blue"   In the event of cardiac or respiratory ARREST Do not perform Intubation, CPR, defibrillation or ACLS   In the event of cardiac or respiratory ARREST Use medication by any route, position, wound care, and other measures to relive pain and suffering. May use oxygen, suction and manual treatment of airway obstruction as needed for comfort.      04/26/20 1719        Code Status History    Date Active Date Inactive Code Status Order ID Comments User Context   04/26/2020 1716 04/26/2020 1719 Full Code 04/28/2020  [id number], MD Inpatient   04/26/2020 1523 04/26/2020 1715 DNR 04/28/2020  [id number], RN Inpatient   04/25/2020 2116 04/26/2020 1522 Full Code 04/28/2020  [id number], MD  ED   04/25/2020 1326 04/25/2020  2116 DNR [id number]  Burnadette Pop, MD ED   04/24/2020 2144 04/25/2020 1326 Full Code [id number]  Bobette Mo, MD ED   10/16/2016 1854 10/19/2016 2127 Full Code [id number]  Milagros Loll, MD ED   Advance Care Planning Activity      Prognosis: Poor overall     Care plan was discussed with attending.   Thank you for allowing the Palliative Medicine Team to assist in the care of this patient.   Total Time 35 min Prolonged Time Billed  no      Greater than 50%  of this time was spent counseling and coordinating care related to the above assessment and plan.  Morton Stall, NP  Please contact Palliative Medicine Team phone at [phone] for questions and concerns.

## 2020-04-27 NOTE — Plan of Care (Signed)

## 2020-04-27 NOTE — Telephone Encounter (Signed)
Home Health Verbal Orders - Caller/Agency:Denise with [facility]  Needing an order for palliative care   Callback Number: [phone] option 2

## 2020-04-27 NOTE — Progress Notes (Addendum)
PROGRESS NOTE    Amy Gregory  ESL:[id number] DOB: 06/08/1955 DOA: 04/24/2020 PCP: Danelle Berry, PA-C   Chief Complain: Weakness, diarrhea  Brief Narrative: Patient is a 64 year old female with history of left foot drop, ALS, bilateral hearing loss, dysphagia, GERD, hiatal hernia, impaired memory, nephrolithiasis, osteoarthritis, rheumatoid arthritis who presents to the emergency room with complaints of generalized weakness, diarrhea, increased secretions and dysphagia.Marland KitchenOn presentation she was hypertensive.  She was noted to have hypokalemia.  Urinalysis was suspicious for UTI.  She had mild leukocytosis.  Chest x-ray also showed right lung base consolidation. Palliative care consulted for goals of care.  Plan is to put PEG for permanent dysphagia.  Assessment & Plan:   Principal Problem:   Aspiration pneumonia (HCC) Active Problems:   Rheumatoid arthritis (HCC)   Essential hypertension, benign   Amyotrophic lateral sclerosis (ALS) (HCC)   Hypokalemia   Hyperbilirubinemia   Acute on chronic respiratory failure with hypoxia (HCC)   Aspiration pneumonia/Dysphagia : Chest x-ray showed right lung base  consolidation.  We will continue Unasyn,.  We   requested a speech therapy evaluation who recommended keeping NPO for severe dysphagia. She has mild leukocytosis. After discussion with palliative care, she is agreeable for PEG .  We will inset a gastric tube and start tube feeding until PEG is placed.   Hypokalemia/Hypokalemia: Continue supplementation and monitoring  UTI: Urinalysis suspicious for UTI.  E coli as per urine culture.  Continue current antibiotics  Hypertension: Currently not on therapy at home.  BP fluctuating,continue PRN meds  ALS: PT consulted and recommended  skilled nursing facility on discharge.  Continue supportive care.  She is bedbound and ambulates with the help of electric wheelchair.  Lives with her husband.  Patient and family likely  want to go to home  with home health when ready,will discuss after PEG.  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   She is a DNR now . She is a  candidate for residential hospice but she says she wants to live to spend sometime with her new grandchild.  After extensive discussion, patient is agreeable for PEG placement.  Plan is to discharge her to home with  palliative care follow-up after PEG tube placement.   Addendum: CT abdomen done for PEG plan showed left sided ureteral calculi with mild to moderate hydroureteronephrosis.I have left message to Dr Azzie Almas.         DVT prophylaxis:Lovenox Code Status: DNR Family Communication: Husband on phone on 04/26/20.Called again today not received Status is: Inpatient  Remains inpatient appropriate because:Inpatient level of care appropriate due to severity of illness   Dispo: The patient is from: Home              Anticipated d/c is to: Residential hospice              Anticipated d/c date is: 2 days              Patient currently is not medically stable to d/c.     Consultants: None  Procedures:None  Antimicrobials:  Anti-infectives (From admission, onward)   Start     Dose/Rate Route Frequency Ordered Stop   04/25/20 0900  Ampicillin-Sulbactam (UNASYN) 3 g in sodium chloride 0.9 % 100 mL IVPB        3 g 200 mL/hr over 30 Minutes Intravenous Every 6 hours 04/25/20 0848     04/24/20 2200  metroNIDAZOLE (FLAGYL)  IVPB 500 mg  Status:  Discontinued        500 mg 100 mL/hr over 60 Minutes Intravenous Every 8 hours 04/24/20 2152 04/25/20 0838   04/24/20 1915  levofloxacin (LEVAQUIN) IVPB 750 mg        750 mg 100 mL/hr over 90 Minutes Intravenous  Once 04/24/20 1901 04/24/20 2219      Subjective:  Patient seen and examined the bedside this morning.  Hypertensive during my evaluation, severely dysphasic and hardly able to speak.  Bedbound.  Mouth  dry  Objective: Vitals:   04/26/20 1300 04/26/20 1654 04/26/20 2354 04/27/20 0402  BP: (!) 166/81 (!) 168/70 (!) 99/58 (!) 170/62  Pulse: 86 70 87 80  Resp: (!) 22 14 18 17   Temp:  97.8 F (36.6 C) (!) 97.4 F (36.3 C) 98 F (36.7 C)  TempSrc:   Oral Oral  SpO2: 99% 99% 96% 95%  Weight:      Height:        Intake/Output Summary (Last 24 hours) at 04/27/2020 04/29/2020 Last data filed at 04/26/2020 1536 Gross per 24 hour  Intake 529.59 ml  Output --  Net 529.59 ml   Filed Weights   04/24/20 1632  Weight: 81 kg    Examination:   General exam: Chronically ill looking female, weak, bedbound HEENT:PERRL,Oral mucosa moist, Ear/Nose normal on gross exam Respiratory system: Bilateral equal air entry, normal vesicular breath sounds, no wheezes or crackles  Cardiovascular system: S1 & S2 heard, RRR. No JVD, murmurs, rubs, gallops or clicks. Gastrointestinal system: Abdomen is nondistended, soft and nontender. No organomegaly or masses felt. Normal bowel sounds heard. Central nervous system: Alert and awake, orientation could not be evaluated, quadriplegia  extremities: Trace bilateral lower extremity edema, no clubbing ,no cyanosis,  Skin: No rashes, lesions or ulcers,no icterus ,no pallor  Data Reviewed: I have personally reviewed following labs and imaging studies  CBC: Recent Labs  Lab 04/24/20 1640 04/25/20 0514 04/26/20 0340 04/27/20 0423  WBC 13.2* 13.2* 16.4* 15.7*  NEUTROABS  --  11.7* 14.0* 12.7*  HGB 13.8 13.2 13.8 12.3  HCT 40.8 38.6 39.4 36.1  MCV 90.9 89.1 89.3 88.7  PLT 164 160 182 176   Basic Metabolic Panel: Recent Labs  Lab 04/24/20 1640 04/25/20 0514 04/26/20 0340 04/27/20 0423  NA 135 135 139 142  K 3.0* 3.0* 3.2* 4.0  CL 97* 100 106 110  CO2 26 22 19* 20*  GLUCOSE 119* 103* 86 118*  BUN 20 19 18 14   CREATININE 0.40* <0.30* <0.30* 0.35*  CALCIUM 8.7* 8.7* 8.5* 8.5*  MG  --  1.6*  --  1.8  PHOS  --   --  1.6* 3.6   GFR: Estimated  Creatinine Clearance: 77.3 mL/min (A) (by C-G formula based on SCr of 0.35 mg/dL (L)). Liver Function Tests: Recent Labs  Lab 04/24/20 1640 04/25/20 0514  AST 29 26  ALT 34 27  ALKPHOS 97 99  BILITOT 2.6* 2.4*  PROT 6.9 6.9  ALBUMIN 3.5 3.2*   No results for input(s): LIPASE, AMYLASE in the last 168 hours. No results for input(s): AMMONIA in the last 168 hours. Coagulation Profile: No results for input(s): INR, PROTIME in the last 168 hours. Cardiac Enzymes: No results for input(s): CKTOTAL, CKMB, CKMBINDEX, TROPONINI in the last 168 hours. BNP (last 3 results) No results for input(s): PROBNP in the last 8760 hours. HbA1C: No results for input(s): HGBA1C in the last 72 hours. CBG: Recent Labs  Lab 04/25/20  2158 04/26/20 0805 04/26/20 1118  GLUCAP 82 85 91   Lipid Profile: No results for input(s): CHOL, HDL, LDLCALC, TRIG, CHOLHDL, LDLDIRECT in the last 72 hours. Thyroid Function Tests: No results for input(s): TSH, T4TOTAL, FREET4, T3FREE, THYROIDAB in the last 72 hours. Anemia Panel: No results for input(s): VITAMINB12, FOLATE, FERRITIN, TIBC, IRON, RETICCTPCT in the last 72 hours. Sepsis Labs: No results for input(s): PROCALCITON, LATICACIDVEN in the last 168 hours.  Recent Results (from the past 240 hour(s))  Resp Panel by RT-PCR (Flu A&B, Covid) Nasopharyngeal Swab     Status: None   Collection Time: 04/24/20  4:40 PM   Specimen: Nasopharyngeal Swab; Nasopharyngeal(NP) swabs in vial transport medium  Result Value Ref Range Status   SARS Coronavirus 2 by RT PCR NEGATIVE NEGATIVE Final    Comment: (NOTE) SARS-CoV-2 target nucleic acids are NOT DETECTED.  The SARS-CoV-2 RNA is generally detectable in upper respiratory specimens during the acute phase of infection. The lowest concentration of SARS-CoV-2 viral copies this assay can detect is 138 copies/mL. A negative result does not preclude SARS-Cov-2 infection and should not be used as the sole basis for  treatment or other patient management decisions. A negative result may occur with  improper specimen collection/handling, submission of specimen other than nasopharyngeal swab, presence of viral mutation(s) within the areas targeted by this assay, and inadequate number of viral copies(<138 copies/mL). A negative result must be combined with clinical observations, patient history, and epidemiological information. The expected result is Negative.  Fact Sheet for Patients:  BloggerCourse.com  Fact Sheet for Healthcare Providers:  SeriousBroker.it  This test is no t yet approved or cleared by the [place] FDA and  has been authorized for detection and/or diagnosis of SARS-CoV-2 by FDA under an Emergency Use Authorization (EUA). This EUA will remain  in effect (meaning this test can be used) for the duration of the COVID-19 declaration under Section 564(b)(1) of the Act, 21 U.S.C.section 360bbb-3(b)(1), unless the authorization is terminated  or revoked sooner.       Influenza A by PCR NEGATIVE NEGATIVE Final   Influenza B by PCR NEGATIVE NEGATIVE Final    Comment: (NOTE) The Xpert Xpress SARS-CoV-2/FLU/RSV plus assay is intended as an aid in the diagnosis of influenza from Nasopharyngeal swab specimens and should not be used as a sole basis for treatment. Nasal washings and aspirates are unacceptable for Xpert Xpress SARS-CoV-2/FLU/RSV testing.  Fact Sheet for Patients: BloggerCourse.com  Fact Sheet for Healthcare Providers: SeriousBroker.it  This test is not yet approved or cleared by the [place] FDA and has been authorized for detection and/or diagnosis of SARS-CoV-2 by FDA under an Emergency Use Authorization (EUA). This EUA will remain in effect (meaning this test can be used) for the duration of the COVID-19 declaration under Section 564(b)(1) of the Act, 21  U.S.C. section 360bbb-3(b)(1), unless the authorization is terminated or revoked.  Performed at [facility], [address] [place], [place] [address]   Urine culture     Status: Abnormal (Preliminary result)   Collection Time: 04/24/20  5:12 PM   Specimen: Urine, Random  Result Value Ref Range Status   Specimen Description   Final    URINE, RANDOM Performed at [facility], [address] [place], [place] [address]    Special Requests   Final    NONE Performed at [facility], [address] [place], [place] [address]    Culture >=100,000 COLONIES/mL GRAM NEGATIVE RODS (A)  Final  Report Status PENDING  Incomplete  Blood culture (routine x 2)     Status: None (Preliminary result)   Collection Time: 04/25/20  8:37 AM   Specimen: BLOOD  Result Value Ref Range Status   Specimen Description BLOOD LH  Final   Special Requests   Final    BOTTLES DRAWN AEROBIC AND ANAEROBIC Blood Culture adequate volume   Culture   Final    NO GROWTH 2 DAYS Performed at [facility], [address], [place], [place] [address]    Report Status PENDING  Incomplete  MRSA PCR Screening     Status: None   Collection Time: 04/25/20 10:26 PM   Specimen: Nasopharyngeal  Result Value Ref Range Status   MRSA by PCR NEGATIVE NEGATIVE Final    Comment:        The GeneXpert MRSA Assay (FDA approved for NASAL specimens only), is one component of a comprehensive MRSA colonization surveillance program. It is not intended to diagnose MRSA infection nor to guide or monitor treatment for MRSA infections. Performed at [facility], [address] [place], [place] [address]          Radiology Studies: DG ESOPHAGUS W SINGLE CM (SOL OR THIN BA)  Result Date: 04/26/2020 CLINICAL DATA:  Patient unable to swallow any liquids EXAM: ESOPHOGRAM/BARIUM SWALLOW TECHNIQUE: Single contrast examination was performed using  thin barium. FLUOROSCOPY TIME:   Fluoroscopy Time:  0.4 minute Radiation Exposure Index (if provided by the fluoroscopic device): 0.9 mGy Number of Acquired Spot Images: 0 COMPARISON:  None. FINDINGS: Real-time fluoroscopy was utilized for evaluation of the swallow function and esophagus. Patient was position in the semi upright position. Upon multiple attempts, the patient was unable to ingest any barium. Evaluation with speech pathology may be helpful. IMPRESSION: Patient was position in the semi upright position. Upon multiple attempts, the patient was unable to ingest any barium. Evaluation with speech pathology may be helpful. Electronically Signed   By: Elige Ko   On: 04/26/2020 11:00        Scheduled Meds: . Chlorhexidine Gluconate Cloth  6 each Topical Daily  . enoxaparin (LOVENOX) injection  40 mg Subcutaneous Q24H  . glycopyrrolate  0.1 mg Intravenous BID   Continuous Infusions: . ampicillin-sulbactam (UNASYN) IV 3 g (04/27/20 0249)  . dextrose 5 % and 0.9% NaCl 75 mL/hr at 04/27/20 0523     LOS: 2 days    Time spent: 35 mins.More than 50% of that time was spent in counseling and/or coordination of care.      Burnadette Pop, MD [facility] P12/21/2021, 7:31 AM

## 2020-04-27 NOTE — TOC Initial Note (Signed)
Transition of Care [facility]) - Initial/Assessment Note    Patient Details  Name: Amy Gregory MRN: [id number] Date of Birth: Oct 22, 1955  Transition of Care [facility]) CM/SW Contact:    Shelbie Hutching, RN Phone Number: 04/27/2020, 3:23 PM  Clinical Narrative:                 RNCM met with patient and patient's husband at the bedisde.  Patient is from home with her husband and has been getting home health services through [employer].  Patient has been declining over the past 3 months to end stage ALS.  Patient is not ready for hospice services she wants to spend time with her family and hopes to be home by Christmas.  Patient agrees to have a feeding tube placed and return home with home health and OP Palliative.  OP Palliative referral given to Santiago Glad and Kieth Brightly with [facility].  Patient has a hospital bed and hoyer lift at home and will need EMS for transport at discharge.  Patient will need to be set up for Enteral nutrition once PEG placed.  TOC to cont to follow.   Expected Discharge Plan: [place] Barriers to Discharge: Continued Medical Work up   Patient Goals and CMS Choice Patient states their goals for this hospitalization and ongoing recovery are:: To be home for Christmas CMS Medicare.gov Compare Post Acute Care list provided to:: Patient Choice offered to / list presented to : [facility]  Expected Discharge Plan and Services Expected Discharge Plan: [place]   Discharge Planning Services: CM Consult Post Acute Care Choice: [place] arrangements for the past 2 months: Single Family Home                           HH Arranged: RN,PT,OT,Nurse's Aide,Social Work [employer] Agency: [place] Date [place]: 04/27/20 Time [place]: 1520 Representative spoke with at [place]: Malachy Mood  Prior Living Arrangements/Services Living arrangements for the past 2 months: [place] Lives with::  Spouse Patient language and need for interpreter reviewed:: Yes Do you feel safe going back to the place where you live?: Yes      Need for Family Participation in Patient Care: Yes (Comment) (ALS) Care giver support system in place?: Yes (comment) (husband) Current home services: DME (hospital bed, hoyer lift) Criminal Activity/Legal Involvement Pertinent to Current Situation/Hospitalization: No - Comment as needed  Activities of Daily Living      Permission Sought/Granted Permission sought to share information with : Case Manager,Facility Contact Representative,Family Supports Permission granted to share information with : Yes, Verbal Permission Granted  Share Information with NAME: Kirk Ruths  Permission granted to share info w AGENCY: [facility]  Permission granted to share info w Relationship: husband     Emotional Assessment Appearance:: Appears stated age Attitude/Demeanor/Rapport: Unable to Assess Affect (typically observed): Unable to Assess Orientation: : Oriented to Self Alcohol / Substance Use: Not Applicable Psych Involvement: No (comment)  Admission diagnosis:  Lower urinary tract infectious disease [N39.0] Aspiration pneumonia ([place]) [J69.0] Weakness [R53.1] Dysphagia [R13.10] Acute on chronic respiratory failure with hypoxia ([place]) [J96.21] Community acquired pneumonia of right lung, unspecified part of lung [J18.9] Patient Active Problem List   Diagnosis Date Noted  . Acute on chronic respiratory failure with hypoxia ([place]) 04/25/2020  . Aspiration pneumonia ([place]) 04/24/2020  . Hypokalemia 04/24/2020  . Hyperbilirubinemia 04/24/2020  . Amyotrophic lateral sclerosis (ALS) ([place])  03/03/2019  . Medication monitoring encounter 07/23/2017  . Essential hypertension, benign 07/09/2017  . Elevated glucose 07/09/2017  . Pessary maintenance 04/03/2017  . Acquired left foot drop 02/22/2017  . Cystocele, midline 02/08/2017  . Vaginal atrophy 02/08/2017  . Menopause  02/08/2017  . Status post laparoscopic supracervical hysterectomy 02/08/2017  . Nocturia 02/08/2017  . Urinary incontinence without sensory awareness 02/08/2017  . Osteoporosis 02/07/2017  . Closed displaced spiral fracture of shaft of right femur ([place]) 10/16/2016  . Status post total right knee replacement using cement 08/22/2016  . 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 Left leg weakness 02/03/2016  . Rheumatoid arthritis ([place]) 01/20/2016  . Vitamin D deficiency 07/13/2015  . Preventative health care 07/13/2015  . Right upper quadrant abdominal pain 07/13/2015  . Bilateral hearing loss   . Dysphagia   . Impaired memory   . History of diverticulitis   . Arthritis of knee, degenerative 03/04/2015  . Primary osteoarthritis of left knee 03/04/2015  . Gonalgia 02/18/2015  . Can't get food down 07/02/2014  . History of arthroscopy of knee 05/07/2014  . Calculus of kidney 10/02/2013  . Arthritis, degenerative 08/21/2013   PCP:  Delsa Grana, PA-C Pharmacy:   [place], [address] [place] [address] Phone: [phone] Fax: [phone]     Social Determinants of Health (SDOH) Interventions    Readmission Risk Interventions No flowsheet data found.

## 2020-04-27 NOTE — Transfer of Care (Signed)
Immediate Anesthesia Transfer of Care Note  Patient: Amy Gregory  Procedure(s) Performed: CYSTOSCOPY/URETEROSCOPY/STENT PLACEMENT (Left )  Patient Location: PACU  Anesthesia Type:MAC  Level of Consciousness: awake  Airway & Oxygen Therapy: Patient Spontanous Breathing and Patient connected to face mask oxygen  Post-op Assessment: Report given to RN and Post -op Vital signs reviewed and stable  Post vital signs: Reviewed and stable  Last Vitals:  Vitals Value Taken Time  BP 185/87 04/27/20 2016  Temp 36.1 C 04/27/20 2015  Pulse 91 04/27/20 2024  Resp 21 04/27/20 2024  SpO2 100 % 04/27/20 2024  Vitals shown include unvalidated device data.  Last Pain:  Vitals:   04/27/20 1206  TempSrc:   PainSc: 0-No pain         Complications: No complications documented.

## 2020-04-27 NOTE — Anesthesia Preprocedure Evaluation (Addendum)
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  Reviewed: Allergy & Precautions, H&P , NPO status , Patient's Chart, lab work & pertinent test results, reviewed documented beta blocker date and time   History of Anesthesia Complications Negative for: history of anesthetic complications  Airway Mallampati: III  TM Distance: >3 FB Neck ROM: full    Dental  (+) Caps, Missing, Teeth Intact   Pulmonary neg sleep apnea, pneumonia, unresolved, neg COPD, Patient abstained from smoking.Not current smoker, former smoker,  Possible lung infiltrate, 2/2 aspiration vs PNA    + decreased breath sounds      Cardiovascular Exercise Tolerance: Good METS(-) hypertension(-) CAD and (-) Past MI negative cardio ROS  (-) dysrhythmias  Rhythm:Regular Rate:Normal     Neuro/Psych Advanced ALS  Neuromuscular disease negative psych ROS   GI/Hepatic Neg liver ROS, hiatal hernia, GERD  Poorly Controlled,Dysphagia 2/2 ALS   Endo/Other  negative endocrine ROSneg diabetes  Renal/GU Renal disease (kidney stones)negative Renal ROS  negative genitourinary   Musculoskeletal   Abdominal   Peds  Hematology negative hematology ROS (+)   Anesthesia Other Findings Past Medical History: No date: Bilateral hearing loss No date: Dysphagia No date: GERD (gastroesophageal reflux disease) No date: Hiatal hernia     Comment: small No date: History of diverticulitis No date: Impaired memory No date: Nephrolithiasis No date: OA (osteoarthritis) No date: Rheumatoid arthritis (HCC)     Comment: managed by Dr. Jefm Bryant   Reproductive/Obstetrics negative OB ROS                             Anesthesia Physical  Anesthesia Plan  ASA: IV  Anesthesia Plan: MAC   Post-op Pain Management:     Induction: Intravenous  PONV Risk Score and Plan: 3 and Ondansetron, TIVA and Midazolam  Airway Management Planned: Nasal Cannula and Natural Airway  Additional Equipment: None  Intra-op Plan:   Post-operative Plan:   Informed Consent: I have reviewed the patients History and Physical, chart, labs and discussed the procedure including the risks, benefits and alternatives for the proposed anesthesia with the patient or authorized representative who has indicated his/her understanding and acceptance.   Patient has DNR.  Discussed DNR with patient and Suspend DNR.   Dental Advisory Given  Plan Discussed with: Anesthesiologist, CRNA and Surgeon  Anesthesia Plan Comments: (Procedure is of an urgent nature given infected obstructed stone. Patient's respiratory status suboptimal given likely underlying aspiration pneuomonia. Will give minimal sedation. Discussed risks of anesthesia with patient, including possibility of difficulty with spontaneous ventilation under anesthesia necessitating airway intervention, PONV, and rare risks such as cardiac or respiratory or neurological events. Patient understands.  )       Anesthesia Quick Evaluation

## 2020-04-27 NOTE — Consult Note (Signed)
Urology Consult   I have been asked to see the patient by Dr.Adhikari , for evaluation and management of UTI and left ureteral stone.  Chief Complaint: UTI and left ureteral stone  HPI:  Amy Gregory is a 64 y.o. year old ALS who was admitted on 04/24/2020 with weakness and diarrhea.  Work-up suggested pneumonia and UTI, and she has been on antibiotics.  A CT this afternoon for potential PEG tube placement showed left-sided hydronephrosis with a 7 mm left mid ureteral stone and 4 mm left mid ureteral stone.  Urine culture from 12/18 grew E. coli.  The patient is very difficult to communicate with with her ALS, and I was able to get her husband on speaker phone in the room this evening to fill out details of the history.  She reports she has had stones before and required a stent.  She also has had some left-sided abdominal pain over the last few weeks.  She has persistent leukocytosis to 16 K despite antibiotics.  CODE STATUS is DNR/DNI.  Palliative care is also involved, but she is hoping to be able to spend some time with her family for the holiday.  PMH: Past Medical History:  Diagnosis Date  . Acquired left foot drop 02/22/2017  . ALS (amyotrophic lateral sclerosis) (HCC)   . Bilateral hearing loss   . Dysphagia   . GERD (gastroesophageal reflux disease)   . Hiatal hernia    small  . History of diverticulitis   . Impaired memory   . Nephrolithiasis   . OA (osteoarthritis)   . Osteoporosis 02/07/2017  . Rheumatoid arthritis (HCC)    managed by Dr. Gavin Potters    Surgical History: Past Surgical History:  Procedure Laterality Date  . ABDOMINAL HYSTERECTOMY  2002   for fibroids, non cancerous reasons, ovaries remain  . BREAST BIOPSY Left 2005-ish   benign  . CATARACT EXTRACTION  2014   OU  . COLONOSCOPY  04/26/04  . ESOPHAGEAL DILATION    . ESOPHAGOGASTRODUODENOSCOPY  02/01/05 and 07/13/14  . INTRAMEDULLARY (IM) NAIL INTERTROCHANTERIC Right 10/17/2016   Procedure:  INTRAMEDULLARY (IM) NAIL INTERTROCHANTRIC;  Surgeon: Christena Flake, MD;  Location: [facility] ORS;  Service: Orthopedics;  Laterality: Right;  . KNEE SURGERY Right 03/2014   partial medial and lateral meniscectomy and medial condryle debridement  . LEG SURGERY  2013   fractured left leg, toes, hospitalized for 2 weeks  . ORIF TIBIA FRACTURE     remote ORIF proximal left tiba fracture     Allergies:  Allergies  Allergen Reactions  . Sulfa Antibiotics   . Cefdinir Rash  . Oxycodone Rash    PER PATIENT ON 7.25.18    Family History: Family History  Problem Relation Age of Onset  . Heart disease Mother   . Heart attack Mother   . Rheum arthritis Mother   . Stroke Mother   . Arthritis Mother        RA  . Hypertension Mother   . Stroke Father   . Diabetes Father   . Cancer Sister        lymphoma  . Heart attack Maternal Uncle   . Cancer Maternal Grandmother        leukemia  . Arthritis Maternal Grandmother   . Depression Maternal Grandfather   . Stroke Paternal Grandmother   . Cancer Paternal Grandfather   . Arthritis Sister        rheumatitis  . Thyroid disease Sister   .  Hypertension Sister   . Cancer Sister        lung  . Depression Sister   . Breast cancer Paternal Aunt 64  . Kidney cancer Neg Hx   . Bladder Cancer Neg Hx     Social History:  reports that she quit smoking about 51 years ago. Her smoking use included cigarettes. She started smoking about 54 years ago. She has a 3.00 pack-year smoking history. She has never used smokeless tobacco. She reports that she does not drink alcohol and does not use drugs.  ROS: Negative aside from those stated in the HPI.  Physical Exam: BP (!) 184/74 (BP Location: Left Arm)   Pulse 76   Temp 98 F (36.7 C)   Resp 20   Ht 5\' 6"  (1.676 m)   Wt 81 kg   SpO2 95%   BMI 28.83 kg/m    Constitutional: Ill-appearing, difficult to communicate with Cardiovascular: Regular rate and rhythm Respiratory: Decreased breath sounds  bilaterally GI: Abdomen is soft, nontender, nondistended, no abdominal masses GU: Left CVA tenderness Lymph: No cervical or inguinal lymphadenopathy. Skin: No rashes, bruises or suspicious lesions.  Laboratory Data: Reviewed, see HPI  Pertinent Imaging: I have personally reviewed the CT today showing to left mid ureteral stones with upstream hydronephrosis  Assessment & Plan:   63 year old female with end-stage ALS admitted for pneumonia/UTI and incidentally found to have 7 mm left mid ureteral stone with upstream hydronephrosis on CT for PEG tube planning.  We discussed the need for drainage in the setting of an infected and obstructed system.  A ureteral stent is a small plastic tube that is placed cystoscopically with one end in the kidney and the other end in the bladder that allows the infection from the kidney to drain, and relieves pain from the obstructing stone.  We discussed the risks at length including bleeding, infection, sepsis, death, ureteral injury, and stent related symptoms including urgency/frequency/dysuria/flank pain/gross hematuria.  There is a low, but not 0, risk of inability to pass the ureteral stent alongside the stone from below which would require percutaneous nephrostomy tube by interventional radiology.  Finally, we discussed possible prolonged hospitalization and recovery, possible temporary Foley catheter placement, and 10 to 14-day course of antibiotics.  We reviewed the need for a follow-up procedure for definitive management of their stone when the infection has been treated in 2 to 3 weeks with ureteroscopy/laser lithotripsy.  With her severe ALS, she may transition to hospice and definitive stone management may not be practical.  After long discussion with the patient and her husband, they are in agreement to proceed with left ureteral stent placement tonight.  We will attempt to perform under sedation to avoid intubation with her ALS.  Recommendations: OR  tonight for left ureteral stent placement Continue antibiotics  65, MD  [facility] [address], [address] [place], [place] [phone]

## 2020-04-27 NOTE — Op Note (Signed)
Date of procedure: 04/27/20  Preoperative diagnosis:  1. Left mid ureteral stone 2. Sepsis from urinary source  Postoperative diagnosis:  1. Same  Procedure: 1. Cystoscopy, left retrograde pyelogram with intraoperative interpretation, left ureteral stent placement  Surgeon: Legrand Rams, MD  Anesthesia: General  Complications: None  Intraoperative findings:  1. Bladder with cystitis, no obvious tumors 2. Uncomplicated left ureteral stent placement, purulent drainage from left kidney  EBL: Minimal  Specimens: None  Drains: Left 6 French by 26 cm Bard Optima stent  Indication: Amy Gregory is a 64 y.o. patient with ALS who has been hospitalized for the last 3 days with sepsis, pneumonia, UTI, and was found to have a 7 mm left mid ureteral stone on CT this afternoon.  After reviewing the management options for treatment, they elected to proceed with the above surgical procedure(s). We have discussed the potential benefits and risks of the procedure, side effects of the proposed treatment, the likelihood of the patient achieving the goals of the procedure, and any potential problems that might occur during the procedure or recuperation. Informed consent has been obtained.  Description of procedure:  The patient was taken to the operating room and light sedation was induced. The patient was placed in the dorsal lithotomy position, prepped and draped in the usual sterile fashion, and preoperative antibiotics(Unasyn on the floor) were administered. A preoperative time-out was performed.   A 21 French rigid cystoscope was used to intubate the urethra. Brief cystoscopy showed no suspicious tumors or lesions, and the bladder was erythematous consistent with acute cystitis. A 5 French access catheter was used to perform a left retrograde pyelogram and this showed mild to moderate hydronephrosis. A sensor wire advanced easily alongside the stone up into the left kidney under fluoroscopic  vision, and a 6 [place] by 26 cm Bard Optima stent was placed with excellent curl in the renal pelvis, as well as under direct vision the bladder. There was purulent drainage of urine through the side ports of the stent.  A 16 French Foley was placed to maximize drainage, and 10 cc were placed in the balloon.  Disposition: Stable to PACU  Plan: Continue antibiotics, recommend 2 weeks total Foley can be removed when clinically improving We will arrange follow-up in clinic in 3 to 4 weeks to discuss long-term management of stones  Legrand Rams, MD

## 2020-04-28 ENCOUNTER — Encounter: Payer: Self-pay | Admitting: Urology

## 2020-04-28 ENCOUNTER — Inpatient Hospital Stay: Payer: Medicare HMO

## 2020-04-28 ENCOUNTER — Other Ambulatory Visit: Payer: Self-pay | Admitting: Radiology

## 2020-04-28 DIAGNOSIS — N201 Calculus of ureter: Secondary | ICD-10-CM

## 2020-04-28 DIAGNOSIS — B9620 Unspecified Escherichia coli [E. coli] as the cause of diseases classified elsewhere: Secondary | ICD-10-CM | POA: Diagnosis present

## 2020-04-28 LAB — BASIC METABOLIC PANEL
Anion gap: 11 (ref 5–15)
BUN: 8 mg/dL (ref 8–23)
CO2: 23 mmol/L (ref 22–32)
Calcium: 8.4 mg/dL — ABNORMAL LOW (ref 8.9–10.3)
Chloride: 110 mmol/L (ref 98–111)
Creatinine, Ser: <0.3 mg/dL — ABNORMAL LOW (ref 0.44–1.00)
Glucose, Bld: 124 mg/dL — ABNORMAL HIGH (ref 70–99)
Potassium: 3 mmol/L — ABNORMAL LOW (ref 3.5–5.1)
Sodium: 144 mmol/L (ref 135–145)

## 2020-04-28 LAB — GLUCOSE, CAPILLARY
Glucose-Capillary: 182 mg/dL — ABNORMAL HIGH (ref 70–99)
Glucose-Capillary: 126 mg/dL — ABNORMAL HIGH (ref 70–99)
Glucose-Capillary: 117 mg/dL — ABNORMAL HIGH (ref 70–99)
Glucose-Capillary: 141 mg/dL — ABNORMAL HIGH (ref 70–99)

## 2020-04-28 LAB — MAGNESIUM: Magnesium: 1.7 mg/dL (ref 1.7–2.4)

## 2020-04-28 LAB — PHOSPHORUS: Phosphorus: 2.4 mg/dL — ABNORMAL LOW (ref 2.5–4.6)

## 2020-04-28 MED ORDER — IOHEXOL 300 MG/ML  SOLN
50.0000 mL | Freq: Once | INTRAMUSCULAR | Status: AC | PRN
  Administered 2020-04-28: 10:00:00 50 mL

## 2020-04-28 MED ORDER — POTASSIUM & SODIUM PHOSPHATES 280-160-250 MG PO PACK
1.0000 | PACK | Freq: Four times a day (QID) | ORAL | Status: DC
  Administered 2020-04-28 – 2020-04-29 (×3): 1 via NASOGASTRIC
  Filled 2020-04-28 (×4): qty 1

## 2020-04-28 MED ORDER — MAGIC MOUTHWASH W/LIDOCAINE
5.0000 mL | Freq: Four times a day (QID) | ORAL | Status: AC
  Administered 2020-04-28 – 2020-04-30 (×4): 5 mL via ORAL
  Filled 2020-04-28 (×5): qty 5

## 2020-04-28 MED ORDER — POTASSIUM CHLORIDE 10 MEQ/100ML IV SOLN
10.0000 meq | INTRAVENOUS | Status: AC
  Administered 2020-04-28 (×2): 10 meq via INTRAVENOUS
  Filled 2020-04-28: qty 100

## 2020-04-28 MED ORDER — FREE WATER
30.0000 mL | Status: DC
  Administered 2020-04-28 – 2020-04-29 (×6): 30 mL

## 2020-04-28 MED ORDER — PHENOL 1.4 % MT LIQD
1.0000 | OROMUCOSAL | Status: DC | PRN
  Filled 2020-04-28: qty 177

## 2020-04-28 NOTE — Progress Notes (Signed)
   Subjective Status post left ureteral stent placement last night for UTI with left mid ureteral stones and hydronephrosis No complaints this afternoon, left-sided pain resolved  Physical Exam: BP (!) 180/88 (BP Location: Left Arm)   Pulse (!) 106   Temp 97.6 F (36.4 C) (Axillary)   Resp 20   Ht 5\' 6"  (1.676 m)   Wt 81 kg   SpO2 94%   BMI 28.83 kg/m    Constitutional: Chronically ill-appearing, difficult to communicate with, able to nod yes or now Foley with amber-colored urine  Laboratory Data: Reviewed in epic  Assessment & Plan:   64 year old female with ALS who has been hospitalized since 12/18 with weakness and initially diagnosed with pneumonia and UTI.  CT performed yesterday in anticipation of PEG tube placement showed left hydronephrosis with a 7 mm left mid ureteral stone and urology was consulted.  POD#1 cystoscopy and left ureteral stent placement.  With her significant comorbidities and progressive ALS, will plan for clinic follow-up in 3 to 4 weeks to discuss options for stone and stent management.  Continue antibiotics, recommend 10 to 14-day total course Will arrange urology follow-up in clinic in 3 to 4 weeks Okay to remove Foley from urology perspective  1/19, MD

## 2020-04-28 NOTE — Progress Notes (Signed)
Jevity 1.5 started at 44ml/hr with 63ml q4 free water flushes. Pt encouraged to let nurse know if nausea, vomiting or abdominal cramping occur. Husband updated at bedside on POC. All questions answered and both husband and patient agreeable to care.

## 2020-04-28 NOTE — Progress Notes (Signed)
Patient transported off unit to xray in stable condition.

## 2020-04-28 NOTE — TOC Progression Note (Addendum)
Transition of Care [facility]) - Progression Note    Patient Details  Name: Amy Gregory MRN: [id number] Date of Birth: 1955/07/25  Transition of [facility]) CM/SW Contact  Liliana Cline, LCSW Phone Number: 04/28/2020, 10:50 AM  Clinical Narrative:   CSW reached out to [facility] with [facility] about need for enteral feedings once PEG tube is placed tomorrow. [name] reported she will need orders for enteral feedings today due to upcoming holiday. Updated MD and RN, MD asked CSW to talk with Dietitian.   Sent secure chat to Dietitians and asked them to follow up with [name] with [facility]. [name] to follow up with patient/family to set up enteral feedings for discharge.   1:05- CSW informed by RN that family is asking about hospice services. RNCM made referral for [facility] yesterday. CSW spoke with [facility] Representative Clydie Braun who will check if patient would be hospice appropriate. CSW spoke with Palliative NP who reported her recommendation is Palliative due to patient's care wishes, but fine with Hospice if that is what is decided. Updated [name] with [facility].   1:57- Update from [facility] Representative Clydie Braun that patient will remain Palliative. Updated [name] with [facility].     Expected Discharge Plan: Home w Home Health Services Barriers to Discharge: Continued Medical Work up  Expected Discharge Plan and Services Expected Discharge Plan: Home w Home Health Services   Discharge Planning Services: CM Consult Post Acute Care Choice: Home Health Living arrangements for the past 2 months: Single Family Home                           HH Arranged: RN,PT,OT,Nurse's Aide,Social Work [employer] Agency: [facility] Date HH Agency Contacted: 04/27/20 Time HH Agency Contacted: 1520 Representative spoke with at [facility] Agency: Elnita Maxwell   Social Determinants of Health (SDOH) Interventions    Readmission  Risk Interventions No flowsheet data found.

## 2020-04-28 NOTE — Progress Notes (Signed)
Pt returned from radiology in stable condition with complaints of pain. New 10 french dobhoff placed to left nare patent. Instructions received from radiology to refrain from hooking patient to suction as dye was placed in tube for g-tube procedure tomorrow. Pt medicated for pain per MAR.

## 2020-04-28 NOTE — Progress Notes (Signed)
PROGRESS NOTE    Amy Gregory  JXB:[id number] DOB: [birth year] DOA: 04/24/2020 PCP: Danelle Berry, PA-C   Chief complaint.  Generalized weakness. Brief Narrative:  Patient is a 64 year old female with history of left foot drop, ALS, bilateral hearing loss, dysphagia, GERD, hiatal hernia, impaired memory, nephrolithiasis, osteoarthritis, rheumatoid arthritis who presents to the emergency room with complaints of generalized weakness, diarrhea, increased secretions and dysphagia.Marland KitchenOn presentation she was hypertensive.  She was noted to have hypokalemia.  Urinalysis was suspicious for UTI.  She had mild leukocytosis.  Chest x-ray also showed right lung base consolidation. Patient had a Dobbhoff placed on 12/22, started on tube feeding.   Assessment & Plan:   Principal Problem:   Aspiration pneumonia (HCC) Active Problems:   Rheumatoid arthritis (HCC)   Essential hypertension, benign   Amyotrophic lateral sclerosis (ALS) (HCC)   Hypokalemia   Hyperbilirubinemia   Acute on chronic respiratory failure with hypoxia (HCC)   Left ureteral stone  #1.  Aspiration pneumonia secondary to dysphagia. Continue antibiotics with Unasyn. Patient will be n.p.o.  Tube feeding started. PEG tube will be placed tomorrow.  2.  Hypokalemia and hypophosphatemia. Supplemented.  3.  E. coli urinary tract infection. Left hydronephrosis secondary to obstruction from kidney stone. Status post ureteral stent. Continue antibiotics with Unasyn.  #4.  ALS. Poor prognosis.  Continue palliative care.     DVT prophylaxis: Lovenox Code Status: DNR Family Communication: Husband at bedside Disposition Plan:  .   Status is: Inpatient  Remains inpatient appropriate because:Inpatient level of care appropriate due to severity of illness   Dispo: The patient is from: Home              Anticipated d/c is to: Home              Anticipated d/c date is: 2 days              Patient currently is not medically  stable to d/c.        I/O last 3 completed shifts: In: 200 [I.V.:200] Out: 1500 [Urine:1500] Total I/O In: -  Out: 400 [Urine:400]     Consultants:   Urology  Procedures: Ureteral stent.  Antimicrobials:  Unasyn. Subjective: Patient still feels very tired.  No significant confusion. Denies any short of breath or cough. No fever or chills.   Objective: Vitals:   04/28/20 0815 04/28/20 1049 04/28/20 1222 04/28/20 1500  BP: (!) 180/88 (!) 164/80 (!) 166/88 (!) 160/88  Pulse: (!) 106 96 (!) 101 92  Resp: 20 20 16 20   Temp:  97.6 F (36.4 C) 98.1 F (36.7 C) 98.2 F (36.8 C)  TempSrc:  Axillary Axillary Axillary  SpO2: 94% 97% 96% 96%  Weight:      Height:        Intake/Output Summary (Last 24 hours) at 04/28/2020 1613 Last data filed at 04/28/2020 1056 Gross per 24 hour  Intake 200 ml  Output 1900 ml  Net -1700 ml   Filed Weights   04/24/20 1632  Weight: 81 kg    Examination:  General exam: Appear chronically ill Respiratory system: Clear to auscultation. Respiratory effort normal. Cardiovascular system: S1 & S2 heard, RRR. No JVD, murmurs, rubs, gallops or clicks. No pedal edema. Gastrointestinal system: Abdomen is nondistended, soft and nontender. No organomegaly or masses felt. Normal bowel sounds heard. Central nervous system: Alert and oriented x2. No focal neurological deficits. Extremities: Symmetric Skin: No rashes, lesions or ulcers Psychiatry:  Mood & affect appropriate.  Data Reviewed: I have personally reviewed following labs and imaging studies  CBC: Recent Labs  Lab 04/24/20 1640 04/25/20 0514 04/26/20 0340 04/27/20 0423  WBC 13.2* 13.2* 16.4* 15.7*  NEUTROABS  --  11.7* 14.0* 12.7*  HGB 13.8 13.2 13.8 12.3  HCT 40.8 38.6 39.4 36.1  MCV 90.9 89.1 89.3 88.7  PLT 164 160 182 176   Basic Metabolic Panel: Recent Labs  Lab 04/24/20 1640 04/25/20 0514 04/26/20 0340 04/27/20 0423 04/27/20 1823 04/28/20 0455  NA 135  135 139 142  --  144  K 3.0* 3.0* 3.2* 4.0  --  3.0*  CL 97* 100 106 110  --  110  CO2 26 22 19* 20*  --  23  GLUCOSE 119* 103* 86 118*  --  124*  BUN 20 19 18 14   --  8  CREATININE 0.40* <0.30* <0.30* 0.35*  --  <0.30*  CALCIUM 8.7* 8.7* 8.5* 8.5*  --  8.4*  MG  --  1.6*  --  1.8 1.7 1.7  PHOS  --   --  1.6* 3.6 2.4* 2.4*   GFR: CrCl cannot be calculated (This lab value cannot be used to calculate CrCl because it is not a number: <0.30). Liver Function Tests: Recent Labs  Lab 04/24/20 1640 04/25/20 0514  AST 29 26  ALT 34 27  ALKPHOS 97 99  BILITOT 2.6* 2.4*  PROT 6.9 6.9  ALBUMIN 3.5 3.2*   No results for input(s): LIPASE, AMYLASE in the last 168 hours. No results for input(s): AMMONIA in the last 168 hours. Coagulation Profile: No results for input(s): INR, PROTIME in the last 168 hours. Cardiac Enzymes: No results for input(s): CKTOTAL, CKMB, CKMBINDEX, TROPONINI in the last 168 hours. BNP (last 3 results) No results for input(s): PROBNP in the last 8760 hours. HbA1C: No results for input(s): HGBA1C in the last 72 hours. CBG: Recent Labs  Lab 04/27/20 0901 04/27/20 1205 04/27/20 1655 04/28/20 0807 04/28/20 1225  GLUCAP 145* 132* 117* 182* 126*   Lipid Profile: No results for input(s): CHOL, HDL, LDLCALC, TRIG, CHOLHDL, LDLDIRECT in the last 72 hours. Thyroid Function Tests: No results for input(s): TSH, T4TOTAL, FREET4, T3FREE, THYROIDAB in the last 72 hours. Anemia Panel: No results for input(s): VITAMINB12, FOLATE, FERRITIN, TIBC, IRON, RETICCTPCT in the last 72 hours. Sepsis Labs: No results for input(s): PROCALCITON, LATICACIDVEN in the last 168 hours.  Recent Results (from the past 240 hour(s))  Resp Panel by RT-PCR (Flu A&B, Covid) Nasopharyngeal Swab     Status: None   Collection Time: 04/24/20  4:40 PM   Specimen: Nasopharyngeal Swab; Nasopharyngeal(NP) swabs in vial transport medium  Result Value Ref Range Status   SARS Coronavirus 2 by RT PCR  NEGATIVE NEGATIVE Final    Comment: (NOTE) SARS-CoV-2 target nucleic acids are NOT DETECTED.  The SARS-CoV-2 RNA is generally detectable in upper respiratory specimens during the acute phase of infection. The lowest concentration of SARS-CoV-2 viral copies this assay can detect is 138 copies/mL. A negative result does not preclude SARS-Cov-2 infection and should not be used as the sole basis for treatment or other patient management decisions. A negative result may occur with  improper specimen collection/handling, submission of specimen other than nasopharyngeal swab, presence of viral mutation(s) within the areas targeted by this assay, and inadequate number of viral copies(<138 copies/mL). A negative result must be combined with clinical observations, patient history, and epidemiological information. The expected result is Negative.  Fact Sheet for Patients:  04/26/20  Fact Sheet for Healthcare Providers:  SeriousBroker.it  This test is no t yet approved or cleared by the [place] FDA and  has been authorized for detection and/or diagnosis of SARS-CoV-2 by FDA under an Emergency Use Authorization (EUA). This EUA will remain  in effect (meaning this test can be used) for the duration of the COVID-19 declaration under Section 564(b)(1) of the Act, 21 U.S.C.section 360bbb-3(b)(1), unless the authorization is terminated  or revoked sooner.       Influenza A by PCR NEGATIVE NEGATIVE Final   Influenza B by PCR NEGATIVE NEGATIVE Final    Comment: (NOTE) The Xpert Xpress SARS-CoV-2/FLU/RSV plus assay is intended as an aid in the diagnosis of influenza from Nasopharyngeal swab specimens and should not be used as a sole basis for treatment. Nasal washings and aspirates are unacceptable for Xpert Xpress SARS-CoV-2/FLU/RSV testing.  Fact Sheet for Patients: BloggerCourse.com  Fact Sheet for  Healthcare Providers: SeriousBroker.it  This test is not yet approved or cleared by the [place] FDA and has been authorized for detection and/or diagnosis of SARS-CoV-2 by FDA under an Emergency Use Authorization (EUA). This EUA will remain in effect (meaning this test can be used) for the duration of the COVID-19 declaration under Section 564(b)(1) of the Act, 21 U.S.C. section 360bbb-3(b)(1), unless the authorization is terminated or revoked.  Performed at [facility], [address] [place], [place] [address]   Urine culture     Status: Abnormal   Collection Time: 04/24/20  5:12 PM   Specimen: Urine, Random  Result Value Ref Range Status   Specimen Description   Final    URINE, RANDOM Performed at [facility], [address] [place], [place] [address]    Special Requests   Final    NONE Performed at [facility], [address] [place], [place] [address]    Culture >=100,000 COLONIES/mL ESCHERICHIA COLI (A)  Final   Report Status 04/27/2020 FINAL  Final   Organism ID, Bacteria ESCHERICHIA COLI (A)  Final      Susceptibility   Escherichia coli - MIC*    AMPICILLIN >=32 RESISTANT Resistant     CEFAZOLIN <=4 SENSITIVE Sensitive     CEFEPIME <=0.12 SENSITIVE Sensitive     CEFTRIAXONE <=0.25 SENSITIVE Sensitive     CIPROFLOXACIN <=0.25 SENSITIVE Sensitive     GENTAMICIN <=1 SENSITIVE Sensitive     IMIPENEM <=0.25 SENSITIVE Sensitive     NITROFURANTOIN <=16 SENSITIVE Sensitive     TRIMETH/SULFA >=320 RESISTANT Resistant     AMPICILLIN/SULBACTAM 4 SENSITIVE Sensitive     PIP/TAZO <=4 SENSITIVE Sensitive     * >=100,000 COLONIES/mL ESCHERICHIA COLI  Blood culture (routine x 2)     Status: None (Preliminary result)   Collection Time: 04/25/20  8:37 AM   Specimen: BLOOD  Result Value Ref Range Status   Specimen Description BLOOD LH  Final   Special Requests   Final    BOTTLES DRAWN AEROBIC AND ANAEROBIC Blood  Culture adequate volume   Culture   Final    NO GROWTH 3 DAYS Performed at [facility], [address] [place], [place] [address]    Report Status PENDING  Incomplete  MRSA PCR Screening     Status: None   Collection Time: 04/25/20 10:26 PM   Specimen: Nasopharyngeal  Result Value Ref Range Status   MRSA by PCR NEGATIVE NEGATIVE Final    Comment:        The GeneXpert MRSA Assay (  FDA approved for NASAL specimens only), is one component of a comprehensive MRSA colonization surveillance program. It is not intended to diagnose MRSA infection nor to guide or monitor treatment for MRSA infections. Performed at [facility], [address], [place], [place] [address]          Radiology Studies: CT ABDOMEN WO CONTRAST  Result Date: 04/27/2020 CLINICAL DATA:  Abdominal distension in a 64 year old female. EXAM: CT ABDOMEN WITHOUT CONTRAST TECHNIQUE: Multidetector CT imaging of the abdomen was performed following the standard protocol without IV contrast. COMPARISON:  Sep 20, 2015 and July 15, 2014 FINDINGS: Lower chest: Basilar airspace disease bilaterally. Trace pleural fluid. Hepatobiliary: Sludge in the gallbladder. Lobular hepatic contours, suggestion of background hepatic steatosis. Pancreas: Normal appearance of the pancreas with mild fatty atrophy about the pancreatic head. Spleen: Normal Adrenals/Urinary Tract: Adrenal glands are normal. Nephrolithiasis bilaterally. The LEFT proximal ureteral calculi the largest measuring 5 x 7 mm, a smaller calculus just inferior to this approximately 3 x 4 mm. Mild-to-moderate LEFT-sided hydroureteronephrosis and signs of LEFT-sided perinephric and Peri ureteral stranding. Intrarenal calculi on the LEFT, 2 remaining in the kidney largest approximately 3 mm. Approximately 2 mm calculus in the interpolar RIGHT kidney and a 3-4 mm lower pole calculus on the RIGHT. Stomach/Bowel: Normal appendix. Colonic diverticulosis. Question  mild perienteric stranding in the RIGHT lower quadrant. Some signs of mural stratification are also suggested on noncontrast imaging which limits assessment. Vascular/Lymphatic: Normal caliber abdominal aorta. There is no gastrohepatic or hepatoduodenal ligament lymphadenopathy. No retroperitoneal or mesenteric lymphadenopathy. Other: No visible ascites.  No signs of free air. Musculoskeletal: No acute bone finding or destructive bone process. IMPRESSION: 1. LEFT ureteral calculi with signs of obstruction. 2. Nephrolithiasis bilaterally. 3. Question of mild enteritis or sequela of prior enteritis in the RIGHT lower quadrant, not well evaluated. The appendix is visualized and is normal. There are signs of enteritis noted on in April 26 evaluation from 2018. Correlate with any ongoing symptoms. 4. Basilar airspace disease bilaterally, concerning for pneumonia, would also consider aspiration. 5. Lobular hepatic contours, suggestion of background hepatic steatosis. 6. Sludge in the gallbladder. 7. Aortic atherosclerosis. Aortic Atherosclerosis (ICD10-I70.0). Electronically Signed   By: Donzetta Kohut M.D.   On: 04/27/2020 16:41   DG Naso G Tube Plc W/Fl W/Rad  Result Date: 04/28/2020 CLINICAL DATA:  NG tube placement prior to percutaneous gastrostomy tube placement EXAM: NASO G TUBE PLACEMENT WITH FL AND WITH RAD CONTRAST:  50 mL Omnipaque 300 FLUOROSCOPY TIME:  Fluoroscopy Time:  12 seconds Radiation Exposure Index (if provided by the fluoroscopic device): 0.6 mGy Number of Acquired Spot Images: 0 COMPARISON:  None. FINDINGS: A Dobbhoff tube was inserted through the left nare and advanced into the stomach. Position was confirmed with fluoroscopy. 50 mL of Omnipaque 300 was hand injected through the Dobbhoff tube opacifying the stomach and proximal small bowel. IMPRESSION: Successful placement of a Dobbhoff tube with the tip in the stomach. Electronically Signed   By: Elige Ko   On: 04/28/2020 10:41   DG OR  UROLOGY CYSTO IMAGE ([facility] ONLY)  Result Date: 04/27/2020 There is no interpretation for this exam.  This order is for images obtained during a surgical procedure.  Please See "Surgeries" Tab for more information regarding the procedure.        Scheduled Meds: . Chlorhexidine Gluconate Cloth  6 each Topical Daily  . enoxaparin (LOVENOX) injection  40 mg Subcutaneous Q24H  . free water  30 mL Per Tube  Q4H  . glycopyrrolate  0.1 mg Intravenous BID  . magic mouthwash w/lidocaine  5 mL Oral QID  . potassium & sodium phosphates  1 packet Per NG tube Q6H   Continuous Infusions: . ampicillin-sulbactam (UNASYN) IV 3 g (04/28/20 1048)  . feeding supplement (JEVITY 1.5 CAL/FIBER)       LOS: 3 days    Time spent: 28 minutes    Marrion Coy, MD [facility]   To contact the attending provider between 7A-7P or the covering provider during after hours 7P-7A, please log into the web site [email or web address] and access using universal [place] password for that web site. If you do not have the password, please call the hospital operator.  04/28/2020, 4:13 PM

## 2020-04-28 NOTE — Progress Notes (Signed)
RD recommending no titration of tube feeding tonight as tube feeds will stop at midnight for anticipated procedure in AM. Plan for bolus feeds once peg is placed. MD agreeable to plan. Tube feeding will remain at 22ml/hr until midnight. Pt currently tolerating well.

## 2020-04-28 NOTE — Progress Notes (Signed)
PT Cancellation Note  Patient Details Name: Amy Gregory MRN: [id number] DOB: 1955/12/30   Cancelled Treatment:    Reason Eval/Treat Not Completed: Other (comment). Reviewed chart and discussed with primary RN. Pt with plans for PEG placement tomorrow and then discharge home. Hoyer lift at baseline. No acute therapy needs at this time. Will dc in house. Please re-order if needs change.   Kairon Shock 04/28/2020, 5:13 PM  Elizabeth Palau, PT, DPT [phone]

## 2020-04-29 ENCOUNTER — Inpatient Hospital Stay: Payer: Medicare HMO

## 2020-04-29 ENCOUNTER — Other Ambulatory Visit: Payer: Self-pay | Admitting: *Deleted

## 2020-04-29 DIAGNOSIS — B9620 Unspecified Escherichia coli [E. coli] as the cause of diseases classified elsewhere: Secondary | ICD-10-CM

## 2020-04-29 DIAGNOSIS — N390 Urinary tract infection, site not specified: Secondary | ICD-10-CM

## 2020-04-29 LAB — PROTIME-INR
INR: 1 (ref 0.8–1.2)
Prothrombin Time: 13.1 seconds (ref 11.4–15.2)

## 2020-04-29 LAB — BASIC METABOLIC PANEL WITH GFR
Anion gap: 8 (ref 5–15)
BUN: 10 mg/dL (ref 8–23)
CO2: 29 mmol/L (ref 22–32)
Calcium: 8.3 mg/dL — ABNORMAL LOW (ref 8.9–10.3)
Chloride: 110 mmol/L (ref 98–111)
Creatinine, Ser: <0.3 mg/dL — ABNORMAL LOW (ref 0.44–1.00)
Glucose, Bld: 123 mg/dL — ABNORMAL HIGH (ref 70–99)
Potassium: 2.9 mmol/L — ABNORMAL LOW (ref 3.5–5.1)
Sodium: 147 mmol/L — ABNORMAL HIGH (ref 135–145)

## 2020-04-29 LAB — CBC
HCT: 39.6 % (ref 36.0–46.0)
Hemoglobin: 13 g/dL (ref 12.0–15.0)
MCH: 30 pg (ref 26.0–34.0)
MCHC: 32.8 g/dL (ref 30.0–36.0)
MCV: 91.2 fL (ref 80.0–100.0)
Platelets: 210 10*3/uL (ref 150–400)
RBC: 4.34 MIL/uL (ref 3.87–5.11)
RDW: 15.3 % (ref 11.5–15.5)
WBC: 13.3 10*3/uL — ABNORMAL HIGH (ref 4.0–10.5)
nRBC: 0 % (ref 0.0–0.2)

## 2020-04-29 LAB — MAGNESIUM: Magnesium: 1.8 mg/dL (ref 1.7–2.4)

## 2020-04-29 LAB — PHOSPHORUS: Phosphorus: 2.8 mg/dL (ref 2.5–4.6)

## 2020-04-29 LAB — GLUCOSE, CAPILLARY
Glucose-Capillary: 111 mg/dL — ABNORMAL HIGH (ref 70–99)
Glucose-Capillary: 112 mg/dL — ABNORMAL HIGH (ref 70–99)
Glucose-Capillary: 114 mg/dL — ABNORMAL HIGH (ref 70–99)
Glucose-Capillary: 119 mg/dL — ABNORMAL HIGH (ref 70–99)
Glucose-Capillary: 128 mg/dL — ABNORMAL HIGH (ref 70–99)
Glucose-Capillary: 140 mg/dL — ABNORMAL HIGH (ref 70–99)

## 2020-04-29 MED ORDER — POTASSIUM CHLORIDE 10 MEQ/100ML IV SOLN
10.0000 meq | INTRAVENOUS | Status: AC
  Administered 2020-04-29 (×4): 10 meq via INTRAVENOUS
  Filled 2020-04-29 (×2): qty 100

## 2020-04-29 MED ORDER — POTASSIUM CHLORIDE 10 MEQ/100ML IV SOLN
10.0000 meq | INTRAVENOUS | Status: DC
  Administered 2020-04-29 (×2): 10 meq via INTRAVENOUS
  Filled 2020-04-29 (×2): qty 100

## 2020-04-29 MED ORDER — JEVITY 1.5 CAL/FIBER PO LIQD
1000.0000 mL | ORAL | Status: DC
  Administered 2020-04-29: 1000 mL

## 2020-04-29 MED ORDER — VANCOMYCIN HCL IN DEXTROSE 1-5 GM/200ML-% IV SOLN
1000.0000 mg | INTRAVENOUS | Status: AC
  Administered 2020-04-30: 07:00:00 1000 mg via INTRAVENOUS
  Filled 2020-04-29: qty 200

## 2020-04-29 MED ORDER — FUROSEMIDE 10 MG/ML IJ SOLN
20.0000 mg | Freq: Once | INTRAMUSCULAR | Status: AC
  Administered 2020-04-29: 20 mg via INTRAVENOUS
  Filled 2020-04-29: qty 2

## 2020-04-29 MED ORDER — SODIUM CHLORIDE 0.9 % IV SOLN: INTRAVENOUS | Status: DC

## 2020-04-29 NOTE — OR Nursing (Signed)
Noted pt given Latimer Lovenox last pm, Dr Deanne Coffer notified. Will have to reschedule until Monday 05/03/20

## 2020-04-29 NOTE — TOC Progression Note (Addendum)
Transition of Care [facility]) - Progression Note    Patient Details  Name: Amy Gregory MRN: [id number] Date of Birth: [birth year]  Transition of Care [facility]) CM/SW Contact  Liliana Cline, LCSW Phone Number: 04/29/2020, 12:45 PM  Clinical Narrative:   Patient unable to get PEG tube today. Plan for PEG tube tomorrow per RN, plan to DC tomorrow or Saturday per MD. Updated [name] with [facility] and Cheryl with [facility]. The earliest [facility] can see patient is Sunday, CSW informed Elita Quick, MD, and RN. [name] will do teaching with the family prior to DC. CSW clarified what is needed with [name] and then asked MD for orders by 1 pm today per [name] due to the holiday.  2:30- Reminded MD of need for orders ASAP.  3:40- Asked MD again for orders. Updated [name] with [facility].   3:55- Orders in per MD. Asked [name] to check orders. She reported they may not have time to get the feeds out but if not she will get with RD to get some from the hospital for the short term until they can provide them.    Expected Discharge Plan: Home w Home Health Services Barriers to Discharge: Continued Medical Work up  Expected Discharge Plan and Services Expected Discharge Plan: Home w Home Health Services   Discharge Planning Services: CM Consult Post Acute Care Choice: Home Health Living arrangements for the past 2 months: Single Family Home                           HH Arranged: RN,PT,OT,Nurse's Aide,Social Work [employer] Agency: [facility] Date HH Agency Contacted: 04/27/20 Time HH Agency Contacted: 1520 Representative spoke with at [facility] Agency: Elnita Maxwell   Social Determinants of Health (SDOH) Interventions    Readmission Risk Interventions No flowsheet data found.

## 2020-04-29 NOTE — Progress Notes (Addendum)
PROGRESS NOTE    Amy Gregory  VWP:[id number] DOB: December 06, 1955 DOA: 04/24/2020 PCP: Danelle Berry, PA-C   Chief complaint.  Generalized weakness. Brief Narrative:  Patient is a 64 year old female with history of left foot drop, ALS, bilateral hearing loss, dysphagia, GERD, hiatal hernia, impaired memory, nephrolithiasis, osteoarthritis, rheumatoid arthritis who presents to the emergency room with complaints of generalized weakness, diarrhea, increased secretions and dysphagia.Marland KitchenOn presentation she was hypertensive. She was noted to have hypokalemia. Urinalysis was suspicious for UTI. She had mild leukocytosis. Chest x-ray also showed right lung base consolidation. Patient had a Dobbhoff placed on 12/22, started on tube feeding.    Assessment & Plan:   Principal Problem:   Aspiration pneumonia (HCC) Active Problems:   Rheumatoid arthritis (HCC)   Essential hypertension, benign   Amyotrophic lateral sclerosis (ALS) (HCC)   Hypokalemia   Hyperbilirubinemia   Acute on chronic respiratory failure with hypoxia (HCC)   Left ureteral stone   E. coli UTI  #1.  Aspiration pneumonia secondary to dysphagia. ALS. Acute hypoxemic respiratory  Failure. Patient required 3 L oxygen at time of admission, currently off oxygen. Continue antibiotics, currently on Unasyn.  Patient PEG tube is rescheduled on Monday. Continue tube feeding with Dobbhoff. Continue with the prophylactic Lovenox.  #2.  Hypokalemia and hypophosphatemia Phosphorus level normalized.  Continue supplement potassium.  3.  Mild hypernatremia. We will start tube feeding once PEG tube is in today.  #4 E. coli urinary tract infection. Left hydronephrosis secondary to obstruction from kidney stone. Continue antibiotics.  E. coli susceptible to Unasyn. Status post ureteral stent.      DVT prophylaxis: SCDs Code Status: DNR Family Communication: husband updated Disposition Plan:  .   Status is:  Inpatient  Remains inpatient appropriate because:Inpatient level of care appropriate due to severity of illness   Dispo: The patient is from: Home              Anticipated d/c is to: Home              Anticipated d/c date is: 1 day              Patient currently is not medically stable to d/c.        I/O last 3 completed shifts: In: 399.1 [I.V.:200; IV Piggyback:199.1] Out: 2500 [Urine:2500] No intake/output data recorded.     Consultants:   Urology  Procedures: None  Antimicrobials: Unasyn  Subjective: Patient pending PEG tube placement, n.p.o. Currently does not have signal short of breath or cough.  No abdominal pain or nausea vomiting. No fever or chills. No dysuria hematuria.  Objective: Vitals:   04/29/20 0016 04/29/20 0433 04/29/20 0500 04/29/20 0733  BP: (!) 177/84 (!) 174/85  (!) 160/88  Pulse: 85 72  98  Resp: 20 20  18   Temp: 98.1 F (36.7 C) (!) 97.4 F (36.3 C)  (!) 97.5 F (36.4 C)  TempSrc:    Axillary  SpO2: 97% 93%  96%  Weight:   80.6 kg   Height:        Intake/Output Summary (Last 24 hours) at 04/29/2020 0923 Last data filed at 04/29/2020 0513 Gross per 24 hour  Intake 199.11 ml  Output 1000 ml  Net -800.89 ml   Filed Weights   04/24/20 1632 04/29/20 0500  Weight: 81 kg 80.6 kg    Examination:  General exam: Appears calm and comfortable  Respiratory system: Clear to auscultation. Respiratory effort normal. Cardiovascular system: S1 & S2 heard, RRR. No  JVD, murmurs, rubs, gallops or clicks. No pedal edema. Gastrointestinal system: Abdomen is nondistended, soft and nontender. No organomegaly or masses felt. Normal bowel sounds heard. Central nervous system: Alert and oriented x3. No focal neurological deficits. Extremities: Symmetric 5 x 5 power. Skin: No rashes, lesions or ulcers Psychiatry: Mood & affect appropriate.     Data Reviewed: I have personally reviewed following labs and imaging studies  CBC: Recent Labs   Lab 04/24/20 1640 04/25/20 0514 04/26/20 0340 04/27/20 0423  WBC 13.2* 13.2* 16.4* 15.7*  NEUTROABS  --  11.7* 14.0* 12.7*  HGB 13.8 13.2 13.8 12.3  HCT 40.8 38.6 39.4 36.1  MCV 90.9 89.1 89.3 88.7  PLT 164 160 182 176   Basic Metabolic Panel: Recent Labs  Lab 04/25/20 0514 04/26/20 0340 04/27/20 0423 04/27/20 1823 04/28/20 0455 04/29/20 0412  NA 135 139 142  --  144 147*  K 3.0* 3.2* 4.0  --  3.0* 2.9*  CL 100 106 110  --  110 110  CO2 22 19* 20*  --  23 29  GLUCOSE 103* 86 118*  --  124* 123*  BUN 19 18 14   --  8 10  CREATININE <0.30* <0.30* 0.35*  --  <0.30* <0.30*  CALCIUM 8.7* 8.5* 8.5*  --  8.4* 8.3*  MG 1.6*  --  1.8 1.7 1.7 1.8  PHOS  --  1.6* 3.6 2.4* 2.4* 2.8   GFR: CrCl cannot be calculated (This lab value cannot be used to calculate CrCl because it is not a number: <0.30). Liver Function Tests: Recent Labs  Lab 04/24/20 1640 04/25/20 0514  AST 29 26  ALT 34 27  ALKPHOS 97 99  BILITOT 2.6* 2.4*  PROT 6.9 6.9  ALBUMIN 3.5 3.2*   No results for input(s): LIPASE, AMYLASE in the last 168 hours. No results for input(s): AMMONIA in the last 168 hours. Coagulation Profile: No results for input(s): INR, PROTIME in the last 168 hours. Cardiac Enzymes: No results for input(s): CKTOTAL, CKMB, CKMBINDEX, TROPONINI in the last 168 hours. BNP (last 3 results) No results for input(s): PROBNP in the last 8760 hours. HbA1C: No results for input(s): HGBA1C in the last 72 hours. CBG: Recent Labs  Lab 04/28/20 1615 04/28/20 2016 04/29/20 0025 04/29/20 0438 04/29/20 0736  GLUCAP 117* 141* 128* 119* 114*   Lipid Profile: No results for input(s): CHOL, HDL, LDLCALC, TRIG, CHOLHDL, LDLDIRECT in the last 72 hours. Thyroid Function Tests: No results for input(s): TSH, T4TOTAL, FREET4, T3FREE, THYROIDAB in the last 72 hours. Anemia Panel: No results for input(s): VITAMINB12, FOLATE, FERRITIN, TIBC, IRON, RETICCTPCT in the last 72 hours. Sepsis Labs: No  results for input(s): PROCALCITON, LATICACIDVEN in the last 168 hours.  Recent Results (from the past 240 hour(s))  Resp Panel by RT-PCR (Flu A&B, Covid) Nasopharyngeal Swab     Status: None   Collection Time: 04/24/20  4:40 PM   Specimen: Nasopharyngeal Swab; Nasopharyngeal(NP) swabs in vial transport medium  Result Value Ref Range Status   SARS Coronavirus 2 by RT PCR NEGATIVE NEGATIVE Final    Comment: (NOTE) SARS-CoV-2 target nucleic acids are NOT DETECTED.  The SARS-CoV-2 RNA is generally detectable in upper respiratory specimens during the acute phase of infection. The lowest concentration of SARS-CoV-2 viral copies this assay can detect is 138 copies/mL. A negative result does not preclude SARS-Cov-2 infection and should not be used as the sole basis for treatment or other patient management decisions. A negative result may occur with  improper specimen collection/handling, submission of specimen other than nasopharyngeal swab, presence of viral mutation(s) within the areas targeted by this assay, and inadequate number of viral copies(<138 copies/mL). A negative result must be combined with clinical observations, patient history, and epidemiological information. The expected result is Negative.  Fact Sheet for Patients:  BloggerCourse.com  Fact Sheet for Healthcare Providers:  SeriousBroker.it  This test is no t yet approved or cleared by the [place] FDA and  has been authorized for detection and/or diagnosis of SARS-CoV-2 by FDA under an Emergency Use Authorization (EUA). This EUA will remain  in effect (meaning this test can be used) for the duration of the COVID-19 declaration under Section 564(b)(1) of the Act, 21 U.S.C.section 360bbb-3(b)(1), unless the authorization is terminated  or revoked sooner.       Influenza A by PCR NEGATIVE NEGATIVE Final   Influenza B by PCR NEGATIVE NEGATIVE Final    Comment:  (NOTE) The Xpert Xpress SARS-CoV-2/FLU/RSV plus assay is intended as an aid in the diagnosis of influenza from Nasopharyngeal swab specimens and should not be used as a sole basis for treatment. Nasal washings and aspirates are unacceptable for Xpert Xpress SARS-CoV-2/FLU/RSV testing.  Fact Sheet for Patients: BloggerCourse.com  Fact Sheet for Healthcare Providers: SeriousBroker.it  This test is not yet approved or cleared by the [place] FDA and has been authorized for detection and/or diagnosis of SARS-CoV-2 by FDA under an Emergency Use Authorization (EUA). This EUA will remain in effect (meaning this test can be used) for the duration of the COVID-19 declaration under Section 564(b)(1) of the Act, 21 U.S.C. section 360bbb-3(b)(1), unless the authorization is terminated or revoked.  Performed at [facility], [address] [place], [place] [address]   Urine culture     Status: Abnormal   Collection Time: 04/24/20  5:12 PM   Specimen: Urine, Random  Result Value Ref Range Status   Specimen Description   Final    URINE, RANDOM Performed at [facility], [address] [place], [place] [address]    Special Requests   Final    NONE Performed at [facility], [address] [place], [place] [address]    Culture >=100,000 COLONIES/mL ESCHERICHIA COLI (A)  Final   Report Status 04/27/2020 FINAL  Final   Organism ID, Bacteria ESCHERICHIA COLI (A)  Final      Susceptibility   Escherichia coli - MIC*    AMPICILLIN >=32 RESISTANT Resistant     CEFAZOLIN <=4 SENSITIVE Sensitive     CEFEPIME <=0.12 SENSITIVE Sensitive     CEFTRIAXONE <=0.25 SENSITIVE Sensitive     CIPROFLOXACIN <=0.25 SENSITIVE Sensitive     GENTAMICIN <=1 SENSITIVE Sensitive     IMIPENEM <=0.25 SENSITIVE Sensitive     NITROFURANTOIN <=16 SENSITIVE Sensitive     TRIMETH/SULFA >=320 RESISTANT Resistant      AMPICILLIN/SULBACTAM 4 SENSITIVE Sensitive     PIP/TAZO <=4 SENSITIVE Sensitive     * >=100,000 COLONIES/mL ESCHERICHIA COLI  Blood culture (routine x 2)     Status: None (Preliminary result)   Collection Time: 04/25/20  8:37 AM   Specimen: BLOOD  Result Value Ref Range Status   Specimen Description BLOOD LH  Final   Special Requests   Final    BOTTLES DRAWN AEROBIC AND ANAEROBIC Blood Culture adequate volume   Culture   Final    NO GROWTH 4 DAYS Performed at [facility], [address], [place], [place] [address]  Report Status PENDING  Incomplete  MRSA PCR Screening     Status: None   Collection Time: 04/25/20 10:26 PM   Specimen: Nasopharyngeal  Result Value Ref Range Status   MRSA by PCR NEGATIVE NEGATIVE Final    Comment:        The GeneXpert MRSA Assay (FDA approved for NASAL specimens only), is one component of a comprehensive MRSA colonization surveillance program. It is not intended to diagnose MRSA infection nor to guide or monitor treatment for MRSA infections. Performed at [facility], [address], [place], [place] [address]          Radiology Studies: CT ABDOMEN WO CONTRAST  Result Date: 04/27/2020 CLINICAL DATA:  Abdominal distension in a 64 year old female. EXAM: CT ABDOMEN WITHOUT CONTRAST TECHNIQUE: Multidetector CT imaging of the abdomen was performed following the standard protocol without IV contrast. COMPARISON:  Sep 20, 2015 and July 15, 2014 FINDINGS: Lower chest: Basilar airspace disease bilaterally. Trace pleural fluid. Hepatobiliary: Sludge in the gallbladder. Lobular hepatic contours, suggestion of background hepatic steatosis. Pancreas: Normal appearance of the pancreas with mild fatty atrophy about the pancreatic head. Spleen: Normal Adrenals/Urinary Tract: Adrenal glands are normal. Nephrolithiasis bilaterally. The LEFT proximal ureteral calculi the largest measuring 5 x 7 mm, a smaller calculus just inferior to this  approximately 3 x 4 mm. Mild-to-moderate LEFT-sided hydroureteronephrosis and signs of LEFT-sided perinephric and Peri ureteral stranding. Intrarenal calculi on the LEFT, 2 remaining in the kidney largest approximately 3 mm. Approximately 2 mm calculus in the interpolar RIGHT kidney and a 3-4 mm lower pole calculus on the RIGHT. Stomach/Bowel: Normal appendix. Colonic diverticulosis. Question mild perienteric stranding in the RIGHT lower quadrant. Some signs of mural stratification are also suggested on noncontrast imaging which limits assessment. Vascular/Lymphatic: Normal caliber abdominal aorta. There is no gastrohepatic or hepatoduodenal ligament lymphadenopathy. No retroperitoneal or mesenteric lymphadenopathy. Other: No visible ascites.  No signs of free air. Musculoskeletal: No acute bone finding or destructive bone process. IMPRESSION: 1. LEFT ureteral calculi with signs of obstruction. 2. Nephrolithiasis bilaterally. 3. Question of mild enteritis or sequela of prior enteritis in the RIGHT lower quadrant, not well evaluated. The appendix is visualized and is normal. There are signs of enteritis noted on in April 26 evaluation from 2018. Correlate with any ongoing symptoms. 4. Basilar airspace disease bilaterally, concerning for pneumonia, would also consider aspiration. 5. Lobular hepatic contours, suggestion of background hepatic steatosis. 6. Sludge in the gallbladder. 7. Aortic atherosclerosis. Aortic Atherosclerosis (ICD10-I70.0). Electronically Signed   By: Donzetta Kohut M.D.   On: 04/27/2020 16:41   DG Naso G Tube Plc W/Fl W/Rad  Result Date: 04/28/2020 CLINICAL DATA:  NG tube placement prior to percutaneous gastrostomy tube placement EXAM: NASO G TUBE PLACEMENT WITH FL AND WITH RAD CONTRAST:  50 mL Omnipaque 300 FLUOROSCOPY TIME:  Fluoroscopy Time:  12 seconds Radiation Exposure Index (if provided by the fluoroscopic device): 0.6 mGy Number of Acquired Spot Images: 0 COMPARISON:  None. FINDINGS:  A Dobbhoff tube was inserted through the left nare and advanced into the stomach. Position was confirmed with fluoroscopy. 50 mL of Omnipaque 300 was hand injected through the Dobbhoff tube opacifying the stomach and proximal small bowel. IMPRESSION: Successful placement of a Dobbhoff tube with the tip in the stomach. Electronically Signed   By: Elige Ko   On: 04/28/2020 10:41   DG OR UROLOGY CYSTO IMAGE ([facility] ONLY)  Result Date: 04/27/2020 There is no interpretation for this exam.  This order is  for images obtained during a surgical procedure.  Please See "Surgeries" Tab for more information regarding the procedure.        Scheduled Meds: . Chlorhexidine Gluconate Cloth  6 each Topical Daily  . enoxaparin (LOVENOX) injection  40 mg Subcutaneous Q24H  . free water  30 mL Per Tube Q4H  . glycopyrrolate  0.1 mg Intravenous BID  . magic mouthwash w/lidocaine  5 mL Oral QID   Continuous Infusions: . ampicillin-sulbactam (UNASYN) IV Stopped (04/29/20 0349)  . feeding supplement (JEVITY 1.5 CAL/FIBER) Stopped (04/28/20 2330)  . potassium chloride       LOS: 4 days    Time spent: 27 minutes    Marrion Coy, MD [facility]   To contact the attending provider between 7A-7P or the covering provider during after hours 7P-7A, please log into the web site [email or web address] and access using universal [place] password for that web site. If you do not have the password, please call the hospital operator.  04/29/2020, 9:23 AM

## 2020-04-29 NOTE — Progress Notes (Addendum)
   04/28/20 2014  Assess: MEWS Score  Temp 98.6 F (37 C)  BP (!) 188/92  Pulse Rate (!) 115  Resp 20  SpO2 91 %  O2 Device Room Air  Assess: MEWS Score  MEWS Temp 0  MEWS Systolic 0  MEWS Pulse 2  MEWS RR 0  MEWS LOC 0  MEWS Score 2  MEWS Score Color Yellow  Assess: if the MEWS score is Yellow or Red  Were vital signs taken at a resting state? Yes  Focused Assessment No change from prior assessment  Early Detection of Sepsis Score *See Row Information* Low  MEWS guidelines implemented *See Row Information* Yes  Treat  MEWS Interventions Administered prn meds/treatments  Pain Scale 0-10  Pain Score 0  Take Vital Signs  Increase Vital Sign Frequency  Yellow: Q 2hr X 2 then Q 4hr X 2, if remains yellow, continue Q 4hrs  Escalate  MEWS: Escalate Yellow: discuss with charge nurse/RN and consider discussing with provider and RRT  Notify: Charge Nurse/RN  Name of Charge Nurse/RN Notified Phyllis, RN  Date Charge Nurse/RN Notified 04/28/20  Time Charge Nurse/RN Notified 2020  Document  Progress note created (see row info) Yes  pt has had tachycardia and elevated BP and has PRN labetolol.  Administered dose and HR improved.

## 2020-04-29 NOTE — Progress Notes (Signed)
Tube feed increased to 34ml/hr per RD recommendation. Pt encouraged to let RN know if any discomfort occurs.

## 2020-04-29 NOTE — Progress Notes (Signed)
Nutrition Follow Up Note   DOCUMENTATION CODES:   Not applicable  INTERVENTION:   Increase Jevity 1.5 cal to 40 mL/hr- Pt will need to be NPO at midnight for G-tube placement tomorrow   Once G-tube placed and ready to use recommend transitioning to bolus regimen:   -Day 1 12/25: provide Jevity 1.5 cal 1/2 carton (120 mL) 6 times daily per tube.  -Day 2 12/26: Advance to goal regimen of Jevity 1.5 cal 1 carton (237 mL) 6 times daily per tube.   -Goal regimen provides 2130 kcal, 91 grams protein, 1080 mL H2O  -Recommend flushing with 60 mL before and after each bolus feeding which provides 1800 H2O daily including water in TF.    Patient is at risk for refeeding syndrome. Monitor magnesium, phosphorus, and potassium twice daily while advancing to goal. MD to replace as needed.   NUTRITION DIAGNOSIS:   Inadequate oral intake related to inability to eat,dysphagia as evidenced by NPO status.  GOAL:   Patient will meet greater than or equal to 90% of their needs -progressing with tube feeds  MONITOR:   TF tolerance,Labs,Weight trends,I & O's  ASSESSMENT:   64 y.o. female with medical history significant of acquired left foot drop, ALS, bilateral hearing loss, dysphagia, GERD, hiatal hernia, diverticulosis/diverticulitis, impaired memory, nephrolithiasis, osteoarthritis, osteoporosis, rheumatoid arthritis who is coming to the emergency department due to generalized weakness, associated with diarrhea, increased secretions and inability to swallow safely.   Pt tolerating tube feeds at 43ml/hr yesterday up until NPO at midnight. Pt unable to have G-tube placed today as she had a lovenox dose. Plan will be for G-tube placement tomorrow morning. Pt is anxious to get home for Christmas and would like to discharge home after G-tube placement. Spoke with Dr. Deanne Coffer with IR; pt will need to wait 24 hrs after G-tube placement before starting feeds. Spoke with Dietitian with [facility]; RD  will be able to follow patient over the weekend to help her advance her tube feeds. Pt can start tube feeds on 12/25 if there is no evidence of peritoneal leak. Pt is at refeed risk; electrolytes are being monitored. Will increase Jevity 1.5 via NGT to 10ml/hr; pt will need to be NPO at midnight.   Medications reviewed and include: magic mouthwash, unasyn, vancomycin   Labs reviewed: Na 147(H), K 2.9(L), creat 0.30(L), P 2.8 wnl, Mg 1.8 wnl Wbc- 13.3(H) cbgs- 128, 119, 114, 112 x 24 hrs  Diet Order:   Diet Order            Diet NPO time specified  Diet effective midnight           Diet NPO time specified Except for: Sips with Meds  Diet effective midnight                EDUCATION NEEDS:   No education needs have been identified at this time  Skin:  Skin Assessment: Reviewed RN Assessment  Last BM:  12/23- type 6  Height:   Ht Readings from Last 1 Encounters:  04/24/20 5\' 6"  (1.676 m)    Weight:   Wt Readings from Last 1 Encounters:  04/29/20 80.6 kg   BMI:  Body mass index is 28.68 kg/m.  Estimated Nutritional Needs:   Kcal:  1900-2100 kcal  Protein:  95-105 grams  Fluid:  1.8-2.0 L/day  05/01/20 MS, RD, LDN Please refer to [facility] for RD and/or RD on-call/weekend/after hours pager

## 2020-04-29 NOTE — Progress Notes (Signed)
Jevity 1.5 ml tube feeding started at 23ml/hr per RD recommendation with plan for peg placement tomorrow AM.

## 2020-04-30 ENCOUNTER — Inpatient Hospital Stay: Payer: Medicare HMO

## 2020-04-30 DIAGNOSIS — J9621 Acute and chronic respiratory failure with hypoxia: Secondary | ICD-10-CM

## 2020-04-30 HISTORY — PX: IR GASTROSTOMY TUBE MOD SED: IMG625

## 2020-04-30 LAB — GLUCOSE, CAPILLARY
Glucose-Capillary: 110 mg/dL — ABNORMAL HIGH (ref 70–99)
Glucose-Capillary: 114 mg/dL — ABNORMAL HIGH (ref 70–99)
Glucose-Capillary: 115 mg/dL — ABNORMAL HIGH (ref 70–99)
Glucose-Capillary: 120 mg/dL — ABNORMAL HIGH (ref 70–99)
Glucose-Capillary: 142 mg/dL — ABNORMAL HIGH (ref 70–99)
Glucose-Capillary: 145 mg/dL — ABNORMAL HIGH (ref 70–99)
Glucose-Capillary: 147 mg/dL — ABNORMAL HIGH (ref 70–99)

## 2020-04-30 LAB — CBC WITH DIFFERENTIAL/PLATELET
Abs Immature Granulocytes: 0.23 10*3/uL — ABNORMAL HIGH (ref 0.00–0.07)
Basophils Absolute: 0.1 10*3/uL (ref 0.0–0.1)
Basophils Relative: 1 %
Eosinophils Absolute: 0.1 10*3/uL (ref 0.0–0.5)
Eosinophils Relative: 1 %
HCT: 43.3 % (ref 36.0–46.0)
Hemoglobin: 14 g/dL (ref 12.0–15.0)
Immature Granulocytes: 2 %
Lymphocytes Relative: 19 %
Lymphs Abs: 2.5 10*3/uL (ref 0.7–4.0)
MCH: 30 pg (ref 26.0–34.0)
MCHC: 32.3 g/dL (ref 30.0–36.0)
MCV: 92.7 fL (ref 80.0–100.0)
Monocytes Absolute: 1.2 10*3/uL — ABNORMAL HIGH (ref 0.1–1.0)
Monocytes Relative: 9 %
Neutro Abs: 9.2 10*3/uL — ABNORMAL HIGH (ref 1.7–7.7)
Neutrophils Relative %: 68 %
Platelets: 244 10*3/uL (ref 150–400)
RBC: 4.67 MIL/uL (ref 3.87–5.11)
RDW: 15.6 % — ABNORMAL HIGH (ref 11.5–15.5)
Smear Review: NORMAL
WBC: 13.3 10*3/uL — ABNORMAL HIGH (ref 4.0–10.5)
nRBC: 0 % (ref 0.0–0.2)

## 2020-04-30 LAB — MAGNESIUM: Magnesium: 1.9 mg/dL (ref 1.7–2.4)

## 2020-04-30 LAB — CULTURE, BLOOD (ROUTINE X 2)
Culture: NO GROWTH
Special Requests: ADEQUATE

## 2020-04-30 LAB — BASIC METABOLIC PANEL
Anion gap: 10 (ref 5–15)
BUN: 11 mg/dL (ref 8–23)
CO2: 33 mmol/L — ABNORMAL HIGH (ref 22–32)
Calcium: 8.3 mg/dL — ABNORMAL LOW (ref 8.9–10.3)
Chloride: 104 mmol/L (ref 98–111)
Creatinine, Ser: <0.3 mg/dL — ABNORMAL LOW (ref 0.44–1.00)
Glucose, Bld: 119 mg/dL — ABNORMAL HIGH (ref 70–99)
Potassium: 3.4 mmol/L — ABNORMAL LOW (ref 3.5–5.1)
Sodium: 147 mmol/L — ABNORMAL HIGH (ref 135–145)

## 2020-04-30 LAB — BRAIN NATRIURETIC PEPTIDE: B Natriuretic Peptide: 252.2 pg/mL — ABNORMAL HIGH (ref 0.0–100.0)

## 2020-04-30 LAB — PHOSPHORUS: Phosphorus: 2.7 mg/dL (ref 2.5–4.6)

## 2020-04-30 MED ORDER — IODIXANOL 320 MG/ML IV SOLN
50.0000 mL | Freq: Once | INTRAVENOUS | Status: AC | PRN
  Administered 2020-04-30: 10:00:00 20 mL

## 2020-04-30 MED ORDER — FREE WATER
120.0000 mL | Freq: Every day | Status: DC
  Administered 2020-05-01 – 2020-05-02 (×7): 120 mL

## 2020-04-30 MED ORDER — FUROSEMIDE 10 MG/ML IJ SOLN
20.0000 mg | Freq: Once | INTRAMUSCULAR | Status: AC
  Administered 2020-04-30: 15:00:00 20 mg via INTRAVENOUS
  Filled 2020-04-30: qty 2

## 2020-04-30 MED ORDER — FENTANYL CITRATE (PF) 100 MCG/2ML IJ SOLN
INTRAMUSCULAR | Status: AC
  Filled 2020-04-30: qty 2

## 2020-04-30 MED ORDER — MIDAZOLAM HCL 2 MG/2ML IJ SOLN
INTRAMUSCULAR | Status: DC | PRN
  Administered 2020-04-30 (×2): 1 mg via INTRAVENOUS

## 2020-04-30 MED ORDER — AMOXICILLIN-POT CLAVULANATE 875-125 MG PO TABS
1.0000 | ORAL_TABLET | Freq: Two times a day (BID) | ORAL | Status: DC
  Administered 2020-05-01 – 2020-05-03 (×5): 1
  Filled 2020-04-30 (×7): qty 1

## 2020-04-30 MED ORDER — POTASSIUM CL IN DEXTROSE 5% 20 MEQ/L IV SOLN
20.0000 meq | INTRAVENOUS | Status: DC
  Administered 2020-04-30: 20 meq via INTRAVENOUS
  Filled 2020-04-30: qty 1000

## 2020-04-30 MED ORDER — JEVITY 1.5 CAL/FIBER PO LIQD: 237.0000 mL | Freq: Every day | ORAL | Status: DC

## 2020-04-30 MED ORDER — FENTANYL CITRATE (PF) 100 MCG/2ML IJ SOLN
INTRAMUSCULAR | Status: DC | PRN
  Administered 2020-04-30 (×2): 25 ug via INTRAVENOUS

## 2020-04-30 MED ORDER — POTASSIUM CHLORIDE 10 MEQ/100ML IV SOLN
10.0000 meq | Freq: Once | INTRAVENOUS | Status: AC
  Administered 2020-04-30: 11:00:00 10 meq via INTRAVENOUS
  Filled 2020-04-30: qty 100

## 2020-04-30 MED ORDER — SCOPOLAMINE 1 MG/3DAYS TD PT72
1.0000 | MEDICATED_PATCH | TRANSDERMAL | Status: DC
  Administered 2020-04-30: 1.5 mg via TRANSDERMAL
  Filled 2020-04-30 (×2): qty 1

## 2020-04-30 MED ORDER — MIDAZOLAM HCL 2 MG/2ML IJ SOLN
INTRAMUSCULAR | Status: AC
  Filled 2020-04-30: qty 2

## 2020-04-30 MED ORDER — FUROSEMIDE 10 MG/ML IJ SOLN
40.0000 mg | Freq: Once | INTRAMUSCULAR | Status: AC
  Administered 2020-04-30: 18:00:00 40 mg via INTRAVENOUS
  Filled 2020-04-30: qty 4

## 2020-04-30 NOTE — Sedation Documentation (Signed)
No additional antibiotic needed already on vanco and unasyn

## 2020-04-30 NOTE — Procedures (Signed)
Interventional Radiology Procedure:   Indications: Dysphagia and aspiration pneumonia  Procedure: Gastrostomy tube placement  Findings: 20 Fr tube in stomach  Complications: None     EBL: less than 10 ml  Plan: May start tube feeds tomorrow.  Ok to use for medications now.    Amy Gregory R. Lowella Dandy, MD  Pager: [phone]

## 2020-04-30 NOTE — Sedation Documentation (Signed)
report called to [employer]

## 2020-04-30 NOTE — Progress Notes (Addendum)
PROGRESS NOTE    Amy Gregory  NAT:[id number] DOB: 1956-01-24 DOA: 04/24/2020 PCP: Danelle Berry, PA-C   Chief complaint.  General weakness. Brief Narrative:   Patient is a 64 year old female with history of left foot drop, ALS, bilateral hearing loss, dysphagia, GERD, hiatal hernia, impaired memory, nephrolithiasis, osteoarthritis, rheumatoid arthritis who presents to the emergency room with complaints of generalized weakness, diarrhea, increased secretions and dysphagia.Marland KitchenOn presentation she was hypertensive. She was noted to have hypokalemia. Urinalysis was suspicious for UTI. She had mild leukocytosis. Chest x-ray also showed right lung base consolidation. Patient had a Dobbhoff placed on 12/22, started on tube feeding.  12/24.  PEG tube scheduled today.  Assessment & Plan:   Principal Problem:   Aspiration pneumonia (HCC) Active Problems:   Rheumatoid arthritis (HCC)   Essential hypertension, benign   Amyotrophic lateral sclerosis (ALS) (HCC)   Hypokalemia   Hyperbilirubinemia   Acute on chronic respiratory failure with hypoxia (HCC)   Left ureteral stone   E. coli UTI  #1.  Aspiration pneumonia secondary to dysphagia ALS. Acute hypoxemic respiratory failure. Patient developed hypoxemia again last night, received 20 mg of Lasix.  Currently oxygenation is better.  Repeated chest x-ray still has some consolidation in the base consistent with aspiration pneumonia, not worsening than before.  I personally reviewed the x-ray myself. Continue wean oxygen. I will recheck a BNP today, if not elevated, I will start his lower dose IV fluids.  Patient is scheduled to have PEG tube placed today, will not be starting feeding until tomorrow. Continue Unasyn, plan to change to Augmentin tomorrow after starting tube feeding.  #2.  Hypokalemia and hypophosphatemia. Continue supplement potassium, phosphorus level has normalized.  Recheck of Phos level tomorrow.  3.  Mild  hypernatremia. Still stable.  We will give free water after tube feeding.  If her BNP is normal today, I will start half-normal saline at 50 mL/h.  4.  E. coli urinary tract infection. Left hydronephrosis with obstructing kidney stone. Continue Unasyn, changed to Augmentin for the next 2 weeks. Status post ureteral stent.  Addendum: BNP mildly elevated at 252. Will give 20mg  iv lasix. Due to hypernatremia, will also give D5 with k at 36ml/hr (10 hours).  DVT prophylaxis: SCDs Code Status: DNR Family Communication: Husband updated. Disposition Plan:  .   Status is: Inpatient  Remains inpatient appropriate because:Inpatient level of care appropriate due to severity of illness   Dispo: The patient is from: Home              Anticipated d/c is to: Home              Anticipated d/c date is: 1 day              Patient currently is not medically stable to d/c.        I/O last 3 completed shifts: In: 199.1 [IV Piggyback:199.1] Out: 1950 [Urine:1950] No intake/output data recorded.     Consultants:   IR, urology  Procedures: Ureteral stent.  Antimicrobials: Unasyn. Subjective: Patient developed hypoxemia again while asleep.  Patient did not have significant short of breath at time.  She was given 20 mg IV Lasix.  Her head of bed was elevated.  She did not have any cough. Her oxygenation is much better today.  She still has no short of breath.  No cough. No fever or chills. No abdominal pain or nausea vomiting. No dysuria hematuria  Objective: Vitals:   04/30/20 0412 04/30/20 0500  04/30/20 0507 04/30/20 0734  BP: (!) 177/87   (!) 183/91  Pulse: 80   79  Resp: 18   18  Temp: 97.9 F (36.6 C)   98.1 F (36.7 C)  TempSrc:      SpO2: 100%   100%  Weight:  80.6 kg 79.7 kg   Height:        Intake/Output Summary (Last 24 hours) at 04/30/2020 0820 Last data filed at 04/30/2020 0500 Gross per 24 hour  Intake --  Output 1350 ml  Net -1350 ml   Filed  Weights   04/29/20 0500 04/30/20 0500 04/30/20 0507  Weight: 80.6 kg 80.6 kg 79.7 kg    Examination:  General exam: Appears calm and comfortable  Respiratory system: Clear to auscultation. Respiratory effort normal. Cardiovascular system: S1 & S2 heard, RRR. No JVD, murmurs, rubs, gallops or clicks. No pedal edema. Gastrointestinal system: Abdomen is nondistended, soft and nontender. No organomegaly or masses felt. Normal bowel sounds heard. Central nervous system: Alert and oriented x3.  No focal neurological deficits. Extremities: Symmetric 5 x 5 power. Skin: No rashes, lesions or ulcers Psychiatry: Mood & affect appropriate.     Data Reviewed: I have personally reviewed following labs and imaging studies  CBC: Recent Labs  Lab 04/25/20 0514 04/26/20 0340 04/27/20 0423 04/29/20 0947 04/30/20 0502  WBC 13.2* 16.4* 15.7* 13.3* 13.3*  NEUTROABS 11.7* 14.0* 12.7*  --  9.2*  HGB 13.2 13.8 12.3 13.0 14.0  HCT 38.6 39.4 36.1 39.6 43.3  MCV 89.1 89.3 88.7 91.2 92.7  PLT 160 182 176 210 244   Basic Metabolic Panel: Recent Labs  Lab 04/26/20 0340 04/27/20 0423 04/27/20 1823 04/28/20 0455 04/29/20 0412 04/30/20 0502  NA 139 142  --  144 147* 147*  K 3.2* 4.0  --  3.0* 2.9* 3.4*  CL 106 110  --  110 110 104  CO2 19* 20*  --  23 29 33*  GLUCOSE 86 118*  --  124* 123* 119*  BUN 18 14  --  CREATININE <0.30* 0.35*  --  <0.30* <0.30* <0.30*  CALCIUM 8.5* 8.5*  --  8.4* 8.3* 8.3*  MG  --  1.8 1.7 1.7 1.8 1.9  PHOS 1.6* 3.6 2.4* 2.4* 2.8 2.7   GFR: CrCl cannot be calculated (This lab value cannot be used to calculate CrCl because it is not a number: <0.30). Liver Function Tests: Recent Labs  Lab 04/24/20 1640 04/25/20 0514  AST 29 26  ALT 34 27  ALKPHOS 97 99  BILITOT 2.6* 2.4*  PROT 6.9 6.9  ALBUMIN 3.5 3.2*   No results for input(s): LIPASE, AMYLASE in the last 168 hours. No results for input(s): AMMONIA in the last 168 hours. Coagulation  Profile: Recent Labs  Lab 04/29/20 0947  INR 1.0   Cardiac Enzymes: No results for input(s): CKTOTAL, CKMB, CKMBINDEX, TROPONINI in the last 168 hours. BNP (last 3 results) No results for input(s): PROBNP in the last 8760 hours. HbA1C: No results for input(s): HGBA1C in the last 72 hours. CBG: Recent Labs  Lab 04/29/20 2005 04/30/20 0018 04/30/20 0022 04/30/20 0415 04/30/20 0733  GLUCAP 140* 147* 142* 115* 145*   Lipid Profile: No results for input(s): CHOL, HDL, LDLCALC, TRIG, CHOLHDL, LDLDIRECT in the last 72 hours. Thyroid Function Tests: No results for input(s): TSH, T4TOTAL, FREET4, T3FREE, THYROIDAB in the last 72 hours. Anemia Panel: No results for input(s): VITAMINB12, FOLATE, FERRITIN, TIBC, IRON, RETICCTPCT in the last  72 hours. Sepsis Labs: No results for input(s): PROCALCITON, LATICACIDVEN in the last 168 hours.  Recent Results (from the past 240 hour(s))  Resp Panel by RT-PCR (Flu A&B, Covid) Nasopharyngeal Swab     Status: None   Collection Time: 04/24/20  4:40 PM   Specimen: Nasopharyngeal Swab; Nasopharyngeal(NP) swabs in vial transport medium  Result Value Ref Range Status   SARS Coronavirus 2 by RT PCR NEGATIVE NEGATIVE Final    Comment: (NOTE) SARS-CoV-2 target nucleic acids are NOT DETECTED.  The SARS-CoV-2 RNA is generally detectable in upper respiratory specimens during the acute phase of infection. The lowest concentration of SARS-CoV-2 viral copies this assay can detect is 138 copies/mL. A negative result does not preclude SARS-Cov-2 infection and should not be used as the sole basis for treatment or other patient management decisions. A negative result may occur with  improper specimen collection/handling, submission of specimen other than nasopharyngeal swab, presence of viral mutation(s) within the areas targeted by this assay, and inadequate number of viral copies(<138 copies/mL). A negative result must be combined with clinical  observations, patient history, and epidemiological information. The expected result is Negative.  Fact Sheet for Patients:  BloggerCourse.com  Fact Sheet for Healthcare Providers:  SeriousBroker.it  This test is no t yet approved or cleared by the [place] FDA and  has been authorized for detection and/or diagnosis of SARS-CoV-2 by FDA under an Emergency Use Authorization (EUA). This EUA will remain  in effect (meaning this test can be used) for the duration of the COVID-19 declaration under Section 564(b)(1) of the Act, 21 U.S.C.section 360bbb-3(b)(1), unless the authorization is terminated  or revoked sooner.       Influenza A by PCR NEGATIVE NEGATIVE Final   Influenza B by PCR NEGATIVE NEGATIVE Final    Comment: (NOTE) The Xpert Xpress SARS-CoV-2/FLU/RSV plus assay is intended as an aid in the diagnosis of influenza from Nasopharyngeal swab specimens and should not be used as a sole basis for treatment. Nasal washings and aspirates are unacceptable for Xpert Xpress SARS-CoV-2/FLU/RSV testing.  Fact Sheet for Patients: BloggerCourse.com  Fact Sheet for Healthcare Providers: SeriousBroker.it  This test is not yet approved or cleared by the [place] FDA and has been authorized for detection and/or diagnosis of SARS-CoV-2 by FDA under an Emergency Use Authorization (EUA). This EUA will remain in effect (meaning this test can be used) for the duration of the COVID-19 declaration under Section 564(b)(1) of the Act, 21 U.S.C. section 360bbb-3(b)(1), unless the authorization is terminated or revoked.  Performed at [facility], [address], [place], [place] [address]   Urine culture     Status: Abnormal   Collection Time: 04/24/20  5:12 PM   Specimen: Urine, Random  Result Value Ref Range Status   Specimen Description   Final    URINE, RANDOM Performed  at [facility], [address] [place], [place] [address]    Special Requests   Final    NONE Performed at [facility], [address] [place], [place] [address]    Culture >=100,000 COLONIES/mL ESCHERICHIA COLI (A)  Final   Report Status 04/27/2020 FINAL  Final   Organism ID, Bacteria ESCHERICHIA COLI (A)  Final      Susceptibility   Escherichia coli - MIC*    AMPICILLIN >=32 RESISTANT Resistant     CEFAZOLIN <=4 SENSITIVE Sensitive     CEFEPIME <=0.12 SENSITIVE Sensitive     CEFTRIAXONE <=0.25 SENSITIVE Sensitive  CIPROFLOXACIN <=0.25 SENSITIVE Sensitive     GENTAMICIN <=1 SENSITIVE Sensitive     IMIPENEM <=0.25 SENSITIVE Sensitive     NITROFURANTOIN <=16 SENSITIVE Sensitive     TRIMETH/SULFA >=320 RESISTANT Resistant     AMPICILLIN/SULBACTAM 4 SENSITIVE Sensitive     PIP/TAZO <=4 SENSITIVE Sensitive     * >=100,000 COLONIES/mL ESCHERICHIA COLI  Blood culture (routine x 2)     Status: None   Collection Time: 04/25/20  8:37 AM   Specimen: BLOOD  Result Value Ref Range Status   Specimen Description BLOOD LH  Final   Special Requests   Final    BOTTLES DRAWN AEROBIC AND ANAEROBIC Blood Culture adequate volume   Culture   Final    NO GROWTH 5 DAYS Performed at [facility], [address] [place], [place] [address]    Report Status 04/30/2020 FINAL  Final  MRSA PCR Screening     Status: None   Collection Time: 04/25/20 10:26 PM   Specimen: Nasopharyngeal  Result Value Ref Range Status   MRSA by PCR NEGATIVE NEGATIVE Final    Comment:        The GeneXpert MRSA Assay (FDA approved for NASAL specimens only), is one component of a comprehensive MRSA colonization surveillance program. It is not intended to diagnose MRSA infection nor to guide or monitor treatment for MRSA infections. Performed at [facility], [address] [place], [place] [address]          Radiology Studies: DG Chest 1 View  Result Date:  04/29/2020 CLINICAL DATA:  Acute hypoxemic respiratory failure EXAM: CHEST  1 VIEW COMPARISON:  04/24/2020 FINDINGS: Single frontal view of the chest demonstrates enteric catheter passing below diaphragm tip overlying the gastric body. The proximal extent of a left ureteral stent is identified. Continued patchy consolidation at the lung bases. Small left effusion. No pneumothorax. No acute bony abnormalities. IMPRESSION: 1. Patchy bibasilar consolidation which may reflect airspace disease or atelectasis. No change since prior exam. 2. Trace left pleural effusion. Electronically Signed   By: Sharlet Salina M.D.   On: 04/29/2020 19:25   DG Basil Dess Tube Plc W/Fl W/Rad  Result Date: 04/28/2020 CLINICAL DATA:  NG tube placement prior to percutaneous gastrostomy tube placement EXAM: NASO G TUBE PLACEMENT WITH FL AND WITH RAD CONTRAST:  50 mL Omnipaque 300 FLUOROSCOPY TIME:  Fluoroscopy Time:  12 seconds Radiation Exposure Index (if provided by the fluoroscopic device): 0.6 mGy Number of Acquired Spot Images: 0 COMPARISON:  None. FINDINGS: A Dobbhoff tube was inserted through the left nare and advanced into the stomach. Position was confirmed with fluoroscopy. 50 mL of Omnipaque 300 was hand injected through the Dobbhoff tube opacifying the stomach and proximal small bowel. IMPRESSION: Successful placement of a Dobbhoff tube with the tip in the stomach. Electronically Signed   By: Elige Ko   On: 04/28/2020 10:41        Scheduled Meds: . Chlorhexidine Gluconate Cloth  6 each Topical Daily  . free water  30 mL Per Tube Q4H  . glycopyrrolate  0.1 mg Intravenous BID  . magic mouthwash w/lidocaine  5 mL Oral QID   Continuous Infusions: . sodium chloride    . ampicillin-sulbactam (UNASYN) IV 3 g (04/30/20 0400)  . feeding supplement (JEVITY 1.5 CAL/FIBER) 1,000 mL (04/29/20 1531)  . potassium chloride       LOS: 5 days    Time spent: 34 minutes, than 50% of time involved with direct patient  care    Marrion Coy, MD [facility]   To contact the attending provider between 7A-7P or the covering provider during after hours 7P-7A, please log into the web site [email or web address] and access using universal [place] password for that web site. If you do not have the password, please call the hospital operator.  04/30/2020, 8:20 AM

## 2020-04-30 NOTE — TOC Progression Note (Signed)
Transition of Care [facility]) - Progression Note    Patient Details  Name: ANJANAE WOEHRLE MRN: [id number] Date of Birth: 1955-07-06  Transition of Care [facility]) CM/SW Contact  Allayne Butcher, RN Phone Number: 04/30/2020, 10:57 AM  Clinical Narrative:    G tube placed today by interventional radiology.  If patient tolerates tube feedings today and tonight she can discharge home tomorrow.  Home Health services arranged with [facility].  Enteral feeds with be provided through [facility].     Expected Discharge Plan: Home w Home Health Services Barriers to Discharge: Continued Medical Work up  Expected Discharge Plan and Services Expected Discharge Plan: Home w Home Health Services   Discharge Planning Services: CM Consult Post Acute Care Choice: Home Health Living arrangements for the past 2 months: Single Family Home                 DME Arranged: Tube feeding DME Agency: Other - Comment Date DME Agency Contacted: 04/30/20 Time DME Agency Contacted: 1043 Representative spoke with at DME Agency: Jeri Modena with [facility] HH Arranged: RN,PT,OT,Nurse's Aide,Social Work [employer] Agency: [employer] Date HH Agency Contacted: 04/27/20 Time HH Agency Contacted: 1520 Representative spoke with at [facility] Agency: Elnita Maxwell   Social Determinants of Health (SDOH) Interventions    Readmission Risk Interventions No flowsheet data found.

## 2020-04-30 NOTE — Care Management Important Message (Signed)
Important Message  Patient Details  Name: Amy Gregory MRN: [id number] Date of Birth: [birth year]   Medicare Important Message Given:  Yes     Olegario Messier A Atari Novick 04/30/2020, 11:57 AM

## 2020-04-30 NOTE — Progress Notes (Signed)
At 430 this afternoon, patients condition changed. Patient developed weak cough and rhonchi. Notified Dr. Chipper Herb. Scopolamine patch ordered and IV lasix, and I oral suctioned the patient  At 6 pm patients condition remained unchanged. Notified Dr. Chipper Herb. Another dose of IV lasix ordered and given, Morphine given per PRN order,m and suctioned patient again, At this time patient is resting in bed. No distress noted. Will continue to monitor.

## 2020-04-30 NOTE — TOC Progression Note (Signed)
Transition of Care [facility]) - Progression Note    Patient Details  Name: Amy Gregory MRN: [id number] Date of Birth: 03/17/1956  Transition of Care [facility]) CM/SW Contact  Allayne Butcher, RN Phone Number: 04/30/2020, 12:00 PM  Clinical Narrative:    Patient will not start tube feeds today, plan with [facility] and home health is for home health to initiate tube feeds and make sure patient can tolerate at home.  Plan for DC tomorrow with understanding that home health will follow patient and get to goal feeding.  Tube feed formula is in the room delivered by dietary today for patient to take home tomorrow.    Expected Discharge Plan: Home w Home Health Services Barriers to Discharge: Continued Medical Work up  Expected Discharge Plan and Services Expected Discharge Plan: Home w Home Health Services   Discharge Planning Services: CM Consult Post Acute Care Choice: Home Health Living arrangements for the past 2 months: Single Family Home                 DME Arranged: Tube feeding DME Agency: Other - Comment Date DME Agency Contacted: 04/30/20 Time DME Agency Contacted: 1043 Representative spoke with at DME Agency: Jeri Modena with [facility] HH Arranged: RN,PT,OT,Nurse's Aide,Social Work [employer] Agency: [employer] Date HH Agency Contacted: 04/27/20 Time HH Agency Contacted: 1520 Representative spoke with at [facility] Agency: Elnita Maxwell   Social Determinants of Health (SDOH) Interventions    Readmission Risk Interventions No flowsheet data found.

## 2020-04-30 NOTE — Consult Note (Signed)
Chief Complaint: Patient was seen in consultation today for gastrostomy tube placement  Referring Physician(s): Zhang  Patient Status: [facility] - In-pt  History of Present Illness: Amy Gregory is a 64 y.o. female with history of left foot drop, ALS, bilateral hearing loss, dysphagia, GERD, hiatal hernia, impaired memory, nephrolithiasis, osteoarthritis, rheumatoid arthritis who presented to the emergency room with complaints of generalized weakness, diarrhea, increased secretions and dysphagia.  Patient is being treated for aspiration pneumonia and UTI.  Patient has a NG feeding tube and request for percutaneous gastrostomy tube placement.  Past Medical History:  Diagnosis Date  . Acquired left foot drop 02/22/2017  . ALS (amyotrophic lateral sclerosis) (HCC)   . Bilateral hearing loss   . Dysphagia   . GERD (gastroesophageal reflux disease)   . Hiatal hernia    small  . History of diverticulitis   . Impaired memory   . Nephrolithiasis   . OA (osteoarthritis)   . Osteoporosis 02/07/2017  . Rheumatoid arthritis (HCC)    managed by Dr. Gavin Potters    Past Surgical History:  Procedure Laterality Date  . ABDOMINAL HYSTERECTOMY  2002   for fibroids, non cancerous reasons, ovaries remain  . BREAST BIOPSY Left 2005-ish   benign  . CATARACT EXTRACTION  2014   OU  . COLONOSCOPY  04/26/04  . CYSTOSCOPY/URETEROSCOPY/HOLMIUM LASER/STENT PLACEMENT Left 04/27/2020   Procedure: CYSTOSCOPY/URETEROSCOPY/STENT PLACEMENT;  Surgeon: Sondra Come, MD;  Location: [facility] ORS;  Service: Urology;  Laterality: Left;  . ESOPHAGEAL DILATION    . ESOPHAGOGASTRODUODENOSCOPY  02/01/05 and 07/13/14  . INTRAMEDULLARY (IM) NAIL INTERTROCHANTERIC Right 10/17/2016   Procedure: INTRAMEDULLARY (IM) NAIL INTERTROCHANTRIC;  Surgeon: Christena Flake, MD;  Location: [facility] ORS;  Service: Orthopedics;  Laterality: Right;  . KNEE SURGERY Right 03/2014   partial medial and lateral meniscectomy and medial condryle  debridement  . LEG SURGERY  2013   fractured left leg, toes, hospitalized for 2 weeks  . ORIF TIBIA FRACTURE     remote ORIF proximal left tiba fracture    Allergies: Sulfa antibiotics, Cefdinir, and Oxycodone  Medications: Prior to Admission medications   Medication Sig Start Date End Date Taking? Authorizing Provider  Ascorbic Acid (VITAMIN C) 100 MG tablet Take 100 mg by mouth daily. Patient not taking: Reported on 04/24/2020    [provider]  cholecalciferol (VITAMIN D) 1000 units tablet Take 1,000 Units by mouth daily. Patient not taking: Reported on 04/24/2020    [provider]  gabapentin (NEURONTIN) 100 MG capsule Take 300 mg by mouth at bedtime.  Patient not taking: Reported on 04/24/2020    [provider]  hydroxychloroquine (PLAQUENIL) 200 MG tablet Take 200 mg by mouth daily. Patient not taking: Reported on 04/24/2020    [provider]  Misc Natural Products (ADV TURMERIC CURCUMIN COMPLEX PO) Take 600 mg by mouth daily. Patient not taking: Reported on 04/24/2020    [provider]  Multiple Vitamin (MULTIVITAMIN) capsule Take by mouth. Patient not taking: Reported on 04/24/2020    [provider]  pantoprazole (PROTONIX) 40 MG tablet TAKE 1 TABLET BY MOUTH ONCE DAILY. Patient not taking: Reported on 04/24/2020 05/12/15   [provider]  riluzole (RILUTEK) 50 MG tablet Take 50 mg by mouth every 12 (twelve) hours. Patient not taking: Reported on 04/24/2020    [provider]     Family History  Problem Relation Age of Onset  . Heart disease Mother   . Heart attack Mother   .  Rheum arthritis Mother   . Stroke Mother   . Arthritis Mother        RA  . Hypertension Mother   . Stroke Father   . Diabetes Father   . Cancer Sister        lymphoma  . Heart attack Maternal Uncle   . Cancer Maternal Grandmother        leukemia  . Arthritis Maternal Grandmother   . Depression Maternal Grandfather    . Stroke Paternal Grandmother   . Cancer Paternal Grandfather   . Arthritis Sister        rheumatitis  . Thyroid disease Sister   . Hypertension Sister   . Cancer Sister        lung  . Depression Sister   . Breast cancer Paternal Aunt 4  . Kidney cancer Neg Hx   . Bladder Cancer Neg Hx     Social History   Socioeconomic History  . Marital status: Married    Spouse name: Not on file  . Number of children: Not on file  . Years of education: Not on file  . Highest education level: Not on file  Occupational History  . Not on file  Tobacco Use  . Smoking status: Former Smoker    Packs/day: 1.00    Years: 3.00    Pack years: 3.00    Types: Cigarettes    Start date: 05/08/1966    Quit date: 05/08/1969    Years since quitting: 51.0  . Smokeless tobacco: Never Used  Vaping Use  . Vaping Use: Never used  Substance and Sexual Activity  . Alcohol use: No    Alcohol/week: 0.0 standard drinks  . Drug use: No  . Sexual activity: Yes    Birth control/protection: Post-menopausal  Other Topics Concern  . Not on file  Social History Narrative  . Not on file   Social Determinants of Health   Financial Resource Strain: Not on file  Food Insecurity: Not on file  Transportation Needs: Not on file  Physical Activity: Not on file  Stress: Not on file  Social Connections: Not on file      Review of Systems  Vital Signs: BP (!) 183/91 (BP Location: Left Arm)   Pulse 79   Temp 98.1 F (36.7 C)   Resp 18   Ht 5\' 6"  (1.676 m)   Wt 79.7 kg   SpO2 100%   BMI 28.36 kg/m   Physical Exam Cardiovascular:     Rate and Rhythm: Normal rate and regular rhythm.  Pulmonary:     Comments: Decreased effort and faint breath sounds bilaterally Abdominal:     General: Abdomen is flat. Bowel sounds are normal.     Palpations: Abdomen is soft.  Neurological:     Mental Status: She is alert.     Imaging: CT ABDOMEN WO CONTRAST  Result Date: 04/27/2020 CLINICAL DATA:  Abdominal  distension in a 64 year old female. EXAM: CT ABDOMEN WITHOUT CONTRAST TECHNIQUE: Multidetector CT imaging of the abdomen was performed following the standard protocol without IV contrast. COMPARISON:  Sep 20, 2015 and July 15, 2014 FINDINGS: Lower chest: Basilar airspace disease bilaterally. Trace pleural fluid. Hepatobiliary: Sludge in the gallbladder. Lobular hepatic contours, suggestion of background hepatic steatosis. Pancreas: Normal appearance of the pancreas with mild fatty atrophy about the pancreatic head. Spleen: Normal Adrenals/Urinary Tract: Adrenal glands are normal. Nephrolithiasis bilaterally. The LEFT proximal ureteral calculi the largest measuring 5 x 7 mm, a smaller calculus just inferior  to this approximately 3 x 4 mm. Mild-to-moderate LEFT-sided hydroureteronephrosis and signs of LEFT-sided perinephric and Peri ureteral stranding. Intrarenal calculi on the LEFT, 2 remaining in the kidney largest approximately 3 mm. Approximately 2 mm calculus in the interpolar RIGHT kidney and a 3-4 mm lower pole calculus on the RIGHT. Stomach/Bowel: Normal appendix. Colonic diverticulosis. Question mild perienteric stranding in the RIGHT lower quadrant. Some signs of mural stratification are also suggested on noncontrast imaging which limits assessment. Vascular/Lymphatic: Normal caliber abdominal aorta. There is no gastrohepatic or hepatoduodenal ligament lymphadenopathy. No retroperitoneal or mesenteric lymphadenopathy. Other: No visible ascites.  No signs of free air. Musculoskeletal: No acute bone finding or destructive bone process. IMPRESSION: 1. LEFT ureteral calculi with signs of obstruction. 2. Nephrolithiasis bilaterally. 3. Question of mild enteritis or sequela of prior enteritis in the RIGHT lower quadrant, not well evaluated. The appendix is visualized and is normal. There are signs of enteritis noted on in April 26 evaluation from 2018. Correlate with any ongoing symptoms. 4. Basilar airspace  disease bilaterally, concerning for pneumonia, would also consider aspiration. 5. Lobular hepatic contours, suggestion of background hepatic steatosis. 6. Sludge in the gallbladder. 7. Aortic atherosclerosis. Aortic Atherosclerosis (ICD10-I70.0). Electronically Signed   By: Donzetta Kohut M.D.   On: 04/27/2020 16:41   DG Chest 1 View  Result Date: 04/29/2020 CLINICAL DATA:  Acute hypoxemic respiratory failure EXAM: CHEST  1 VIEW COMPARISON:  04/24/2020 FINDINGS: Single frontal view of the chest demonstrates enteric catheter passing below diaphragm tip overlying the gastric body. The proximal extent of a left ureteral stent is identified. Continued patchy consolidation at the lung bases. Small left effusion. No pneumothorax. No acute bony abnormalities. IMPRESSION: 1. Patchy bibasilar consolidation which may reflect airspace disease or atelectasis. No change since prior exam. 2. Trace left pleural effusion. Electronically Signed   By: Sharlet Salina M.D.   On: 04/29/2020 19:25   DG Chest Portable 1 View  Result Date: 04/24/2020 CLINICAL DATA:  Weakness.  Patient with history of ALS. EXAM: PORTABLE CHEST 1 VIEW COMPARISON:  October 16, 2016 FINDINGS: Opacity in the medial right lung base. The lungs are otherwise clear. No pneumothorax. The heart, hila, and mediastinum are normal. No other acute abnormalities. IMPRESSION: Opacity in the medial right lung base could represent atelectasis or infiltrate such as pneumonia or aspiration. Recommend clinical correlation and short-term follow-up imaging to ensure resolution. Electronically Signed   By: Gerome Sam III M.D   On: 04/24/2020 17:06   DG Basil Dess Tube Plc W/Fl W/Rad  Result Date: 04/28/2020 CLINICAL DATA:  NG tube placement prior to percutaneous gastrostomy tube placement EXAM: NASO G TUBE PLACEMENT WITH FL AND WITH RAD CONTRAST:  50 mL Omnipaque 300 FLUOROSCOPY TIME:  Fluoroscopy Time:  12 seconds Radiation Exposure Index (if provided by the  fluoroscopic device): 0.6 mGy Number of Acquired Spot Images: 0 COMPARISON:  None. FINDINGS: A Dobbhoff tube was inserted through the left nare and advanced into the stomach. Position was confirmed with fluoroscopy. 50 mL of Omnipaque 300 was hand injected through the Dobbhoff tube opacifying the stomach and proximal small bowel. IMPRESSION: Successful placement of a Dobbhoff tube with the tip in the stomach. Electronically Signed   By: Elige Ko   On: 04/28/2020 10:41   DG OR UROLOGY CYSTO IMAGE ([facility] ONLY)  Result Date: 04/27/2020 There is no interpretation for this exam.  This order is for images obtained during a surgical procedure.  Please See "Surgeries" Tab for more information regarding the  procedure.   DG ESOPHAGUS W SINGLE CM (SOL OR THIN BA)  Result Date: 04/26/2020 CLINICAL DATA:  Patient unable to swallow any liquids EXAM: ESOPHOGRAM/BARIUM SWALLOW TECHNIQUE: Single contrast examination was performed using  thin barium. FLUOROSCOPY TIME:  Fluoroscopy Time:  0.4 minute Radiation Exposure Index (if provided by the fluoroscopic device): 0.9 mGy Number of Acquired Spot Images: 0 COMPARISON:  None. FINDINGS: Real-time fluoroscopy was utilized for evaluation of the swallow function and esophagus. Patient was position in the semi upright position. Upon multiple attempts, the patient was unable to ingest any barium. Evaluation with speech pathology may be helpful. IMPRESSION: Patient was position in the semi upright position. Upon multiple attempts, the patient was unable to ingest any barium. Evaluation with speech pathology may be helpful. Electronically Signed   By: Elige Ko   On: 04/26/2020 11:00    Labs:  CBC: Recent Labs    04/26/20 0340 04/27/20 0423 04/29/20 0947 04/30/20 0502  WBC 16.4* 15.7* 13.3* 13.3*  HGB 13.8 12.3 13.0 14.0  HCT 39.4 36.1 39.6 43.3  PLT 182 176 210 244    COAGS: Recent Labs    04/29/20 0947  INR 1.0    BMP: Recent Labs    04/27/20 0423  04/28/20 0455 04/29/20 0412 04/30/20 0502  NA 142 144 147* 147*  K 4.0 3.0* 2.9* 3.4*  CL 110 110 110 104  CO2 20* 23 29 33*  GLUCOSE 118* 124* 123* 119*  BUN 14 8 10 11   CALCIUM 8.5* 8.4* 8.3* 8.3*  CREATININE 0.35* <0.30* <0.30* <0.30*  GFRNONAA >60 NOT CALCULATED NOT CALCULATED NOT CALCULATED    LIVER FUNCTION TESTS: Recent Labs    04/24/20 1640 04/25/20 0514  BILITOT 2.6* 2.4*  AST 29 26  ALT 34 27  ALKPHOS 97 99  PROT 6.9 6.9  ALBUMIN 3.5 3.2*    TUMOR MARKERS: No results for input(s): AFPTM, CEA, CA199, CHROMGRNA in the last 8760 hours.  Assessment and Plan:  64 yo with multiple medical problems including ALS and being treated for aspiration pneumonia.  Patient has dysphagia and needs long term feeding tube.  Reviewed CT abdomen and anatomy is amendable for percutaneous gastrostomy tube placement.  Consent obtained from patient's husband.  Plan for gastrostomy tube placement this morning.    Electronically Signed: 65, MD 04/30/2020, 9:05 AM

## 2020-05-01 LAB — GLUCOSE, CAPILLARY
Glucose-Capillary: 100 mg/dL — ABNORMAL HIGH (ref 70–99)
Glucose-Capillary: 101 mg/dL — ABNORMAL HIGH (ref 70–99)
Glucose-Capillary: 102 mg/dL — ABNORMAL HIGH (ref 70–99)
Glucose-Capillary: 109 mg/dL — ABNORMAL HIGH (ref 70–99)
Glucose-Capillary: 110 mg/dL — ABNORMAL HIGH (ref 70–99)
Glucose-Capillary: 110 mg/dL — ABNORMAL HIGH (ref 70–99)
Glucose-Capillary: 96 mg/dL (ref 70–99)

## 2020-05-01 LAB — BASIC METABOLIC PANEL
Anion gap: 12 (ref 5–15)
BUN: 14 mg/dL (ref 8–23)
CO2: 37 mmol/L — ABNORMAL HIGH (ref 22–32)
Calcium: 8.5 mg/dL — ABNORMAL LOW (ref 8.9–10.3)
Chloride: 99 mmol/L (ref 98–111)
Creatinine, Ser: <0.3 mg/dL — ABNORMAL LOW (ref 0.44–1.00)
Glucose, Bld: 111 mg/dL — ABNORMAL HIGH (ref 70–99)
Potassium: 3.1 mmol/L — ABNORMAL LOW (ref 3.5–5.1)
Sodium: 148 mmol/L — ABNORMAL HIGH (ref 135–145)

## 2020-05-01 LAB — PHOSPHORUS: Phosphorus: 3.7 mg/dL (ref 2.5–4.6)

## 2020-05-01 LAB — MAGNESIUM: Magnesium: 2 mg/dL (ref 1.7–2.4)

## 2020-05-01 LAB — BRAIN NATRIURETIC PEPTIDE: B Natriuretic Peptide: 127.9 pg/mL — ABNORMAL HIGH (ref 0.0–100.0)

## 2020-05-01 MED ORDER — JEVITY 1.2 CAL PO LIQD
1000.0000 mL | ORAL | Status: DC
  Administered 2020-05-01: 1000 mL

## 2020-05-01 MED ORDER — POTASSIUM CHLORIDE 10 MEQ/100ML IV SOLN
10.0000 meq | INTRAVENOUS | Status: AC
  Administered 2020-05-01 (×3): 10 meq via INTRAVENOUS
  Filled 2020-05-01 (×2): qty 100

## 2020-05-01 MED ORDER — POTASSIUM CHLORIDE 10 MEQ/100ML IV SOLN
10.0000 meq | Freq: Once | INTRAVENOUS | Status: AC
  Administered 2020-05-01 (×2): 10 meq via INTRAVENOUS
  Filled 2020-05-01: qty 100

## 2020-05-01 NOTE — Progress Notes (Addendum)
°   05/01/20 1116  Assess: MEWS Score  Temp 97.8 F (36.6 C)  BP (!) 156/92  Pulse Rate (!) 133  Resp 16  SpO2 100 %  Assess: MEWS Score  MEWS Temp 0  MEWS Systolic 0  MEWS Pulse 3  MEWS RR 0  MEWS LOC 0  MEWS Score 3  MEWS Score Color Yellow  Assess: if the MEWS score is Yellow or Red  Were vital signs taken at a resting state? Yes  Focused Assessment No change from prior assessment  Early Detection of Sepsis Score *See Row Information* Low  MEWS guidelines implemented *See Row Information* Yes  Treat  MEWS Interventions Administered scheduled meds/treatments  Pain Scale 0-10  Pain Score 0  Take Vital Signs  Increase Vital Sign Frequency  Yellow: Q 2hr X 2 then Q 4hr X 2, if remains yellow, continue Q 4hrs  Escalate  MEWS: Escalate Yellow: discuss with charge nurse/RN and consider discussing with provider and RRT  Notify: Charge Nurse/RN  Name of Charge Nurse/RN Notified Amanda, RN  Date Charge Nurse/RN Notified 05/01/20  Time Charge Nurse/RN Notified 1120  Charge Nurse was notified. There were existing orders for labetalol for increased BP and pulse, medication was administered and BP and pulse returned to acceptable range.

## 2020-05-01 NOTE — Progress Notes (Signed)
PROGRESS NOTE    Amy KAREEM  Gregory:[id number] DOB: 16-Jan-1956 DOA: 04/24/2020 PCP: Danelle Berry, PA-C   Chief complaint generalized weakness. Brief Narrative:  Patient is a 64 year old female with history of left foot drop, ALS, bilateral hearing loss, dysphagia, GERD, hiatal hernia, impaired memory, nephrolithiasis, osteoarthritis, rheumatoid arthritis who presents to the emergency room with complaints of generalized weakness, diarrhea, increased secretions and dysphagia.Marland KitchenOn presentation she was hypertensive. She was noted to have hypokalemia. Urinalysis was suspicious for UTI. She had mild leukocytosis. Chest x-ray also showed right lung base consolidation. Patient had a Dobbhoff placed on 12/22, started on tube feeding. PEG tube placed on 12/24.  Start continue tube feeding on 12/25.   Assessment & Plan:   Principal Problem:   Aspiration pneumonia (HCC) Active Problems:   Rheumatoid arthritis (HCC)   Essential hypertension, benign   Amyotrophic lateral sclerosis (ALS) (HCC)   Hypokalemia   Hyperbilirubinemia   Acute on chronic respiratory failure with hypoxia (HCC)   Left ureteral stone   E. coli UTI  #1. Acute hypoxemic respiratory failure. Aspiration pneumonia secondary to dysphagia. Dysphagia secondary to ALS. Patient developed hypoxemia again yesterday evening, she also had some respite distress.  She was given 40 mg IV Lasix. Suspect patient has recurrent aspiration, her head of bed has been elevated all the time.  We may not be able to prevent aspiration in the future. Due to worsening respite status last night, I will keep patient another day in the hospital.  We will start continuous tube feeding and monitor patient for another day. Continue Augmentin via feeding tube. Due to recurrent aspiration, patient long-term prognosis is poor.  She is unlikely to recover from this condition.  Will be followed by palliative care after discharge.  #2.  Hypokalemia and  hypophosphatemia. Phosphorus level today.  Supplement with IV potassium.  #3.  Mild hyponatremia. Free water given for tube feeding.  4.  E. coli urinary tract infection. Left hydronephrosis with obstructing kidney stone.  Status post ureteral stent. Antibiotic changed to Augmentin.      DVT prophylaxis: SCDs Code Status: DNR Family Communication: Husband updated at bedside. Disposition Plan:  .   Status is: Inpatient  Remains inpatient appropriate because:Inpatient level of care appropriate due to severity of illness   Dispo: The patient is from: Home              Anticipated d/c is to: Home              Anticipated d/c date is: 1 day              Patient currently is not medically stable to d/c.        I/O last 3 completed shifts: In: 1418.9 [I.V.:45.4; IV Piggyback:1373.6] Out: 1700 [Urine:1700] No intake/output data recorded.     Consultants:   None  Procedures: PEG  Antimicrobials:  Augmentin  Subjective: Patient had worsening short of breath and hypoxemia last night.  She was given 40 mg IV Lasix. She is still on 3 days of oxygen, oxygenation is better this morning.  He is aphasic, she does not feel short of breath.  She has no cough. She has no abdominal pain or nausea vomiting. No fever or chills. No dysuria or hematuria.  Objective: Vitals:   04/30/20 2013 05/01/20 0057 05/01/20 0525 05/01/20 0836  BP: 134/70 135/68 (!) 152/69 (!) 186/84  Pulse: 83 70 70 74  Resp: Temp: (!) 97.5 F (36.4 C) (!)  97.4 F (36.3 C) (!) 97.4 F (36.3 C) 97.8 F (36.6 C)  TempSrc: Oral Oral Oral   SpO2: 100% 100% 100% 100%  Weight:   80.9 kg   Height:        Intake/Output Summary (Last 24 hours) at 05/01/2020 1110 Last data filed at 05/01/2020 0630 Gross per 24 hour  Intake 1418.92 ml  Output 1100 ml  Net 318.92 ml   Filed Weights   04/30/20 0500 04/30/20 0507 05/01/20 0525  Weight: 80.6 kg 79.7 kg 80.9 kg     Examination:  General exam: Appears calm and comfortable  Respiratory system: Clear to auscultation. Respiratory effort normal. Cardiovascular system: S1 & S2 heard, RRR. No JVD, murmurs, rubs, gallops or clicks. No pedal edema. Gastrointestinal system: Abdomen is nondistended, soft and nontender. No organomegaly or masses felt. Normal bowel sounds heard. Central nervous system: Alert and aphasic no focal neurological deficits. Extremities: Symmetric  Skin: No rashes, lesions or ulcers     Data Reviewed: I have personally reviewed following labs and imaging studies  CBC: Recent Labs  Lab 04/25/20 0514 04/26/20 0340 04/27/20 0423 04/29/20 0947 04/30/20 0502  WBC 13.2* 16.4* 15.7* 13.3* 13.3*  NEUTROABS 11.7* 14.0* 12.7*  --  9.2*  HGB 13.2 13.8 12.3 13.0 14.0  HCT 38.6 39.4 36.1 39.6 43.3  MCV 89.1 89.3 88.7 91.2 92.7  PLT 160 182 176 210 244   Basic Metabolic Panel: Recent Labs  Lab 04/27/20 0423 04/27/20 1823 04/28/20 0455 04/29/20 0412 04/30/20 0502 05/01/20 0425  NA 142  --  144 147* 147* 148*  K 4.0  --  3.0* 2.9* 3.4* 3.1*  CL 110  --  110 110 104 99  CO2 20*  --  23 29 33* 37*  GLUCOSE 118*  --  124* 123* 119* 111*  BUN 14  --  CREATININE 0.35*  --  <0.30* <0.30* <0.30* <0.30*  CALCIUM 8.5*  --  8.4* 8.3* 8.3* 8.5*  MG 1.8 1.7 1.7 1.8 1.9 2.0  PHOS 3.6 2.4* 2.4* 2.8 2.7 3.7   GFR: CrCl cannot be calculated (This lab value cannot be used to calculate CrCl because it is not a number: <0.30). Liver Function Tests: Recent Labs  Lab 04/24/20 1640 04/25/20 0514  AST 29 26  ALT 34 27  ALKPHOS 97 99  BILITOT 2.6* 2.4*  PROT 6.9 6.9  ALBUMIN 3.5 3.2*   No results for input(s): LIPASE, AMYLASE in the last 168 hours. No results for input(s): AMMONIA in the last 168 hours. Coagulation Profile: Recent Labs  Lab 04/29/20 0947  INR 1.0   Cardiac Enzymes: No results for input(s): CKTOTAL, CKMB, CKMBINDEX, TROPONINI in the last 168  hours. BNP (last 3 results) No results for input(s): PROBNP in the last 8760 hours. HbA1C: No results for input(s): HGBA1C in the last 72 hours. CBG: Recent Labs  Lab 04/30/20 1625 04/30/20 2019 05/01/20 0240 05/01/20 0656 05/01/20 0835  GLUCAP 110* 120* 101* 102* 110*   Lipid Profile: No results for input(s): CHOL, HDL, LDLCALC, TRIG, CHOLHDL, LDLDIRECT in the last 72 hours. Thyroid Function Tests: No results for input(s): TSH, T4TOTAL, FREET4, T3FREE, THYROIDAB in the last 72 hours. Anemia Panel: No results for input(s): VITAMINB12, FOLATE, FERRITIN, TIBC, IRON, RETICCTPCT in the last 72 hours. Sepsis Labs: No results for input(s): PROCALCITON, LATICACIDVEN in the last 168 hours.  Recent Results (from the past 240 hour(s))  Resp Panel by RT-PCR (Flu A&B, Covid) Nasopharyngeal Swab  Status: None   Collection Time: 04/24/20  4:40 PM   Specimen: Nasopharyngeal Swab; Nasopharyngeal(NP) swabs in vial transport medium  Result Value Ref Range Status   SARS Coronavirus 2 by RT PCR NEGATIVE NEGATIVE Final    Comment: (NOTE) SARS-CoV-2 target nucleic acids are NOT DETECTED.  The SARS-CoV-2 RNA is generally detectable in upper respiratory specimens during the acute phase of infection. The lowest concentration of SARS-CoV-2 viral copies this assay can detect is 138 copies/mL. A negative result does not preclude SARS-Cov-2 infection and should not be used as the sole basis for treatment or other patient management decisions. A negative result may occur with  improper specimen collection/handling, submission of specimen other than nasopharyngeal swab, presence of viral mutation(s) within the areas targeted by this assay, and inadequate number of viral copies(<138 copies/mL). A negative result must be combined with clinical observations, patient history, and epidemiological information. The expected result is Negative.  Fact Sheet for Patients:   BloggerCourse.com  Fact Sheet for Healthcare Providers:  SeriousBroker.it  This test is no t yet approved or cleared by the [place] FDA and  has been authorized for detection and/or diagnosis of SARS-CoV-2 by FDA under an Emergency Use Authorization (EUA). This EUA will remain  in effect (meaning this test can be used) for the duration of the COVID-19 declaration under Section 564(b)(1) of the Act, 21 U.S.C.section 360bbb-3(b)(1), unless the authorization is terminated  or revoked sooner.       Influenza A by PCR NEGATIVE NEGATIVE Final   Influenza B by PCR NEGATIVE NEGATIVE Final    Comment: (NOTE) The Xpert Xpress SARS-CoV-2/FLU/RSV plus assay is intended as an aid in the diagnosis of influenza from Nasopharyngeal swab specimens and should not be used as a sole basis for treatment. Nasal washings and aspirates are unacceptable for Xpert Xpress SARS-CoV-2/FLU/RSV testing.  Fact Sheet for Patients: BloggerCourse.com  Fact Sheet for Healthcare Providers: SeriousBroker.it  This test is not yet approved or cleared by the [place] FDA and has been authorized for detection and/or diagnosis of SARS-CoV-2 by FDA under an Emergency Use Authorization (EUA). This EUA will remain in effect (meaning this test can be used) for the duration of the COVID-19 declaration under Section 564(b)(1) of the Act, 21 U.S.C. section 360bbb-3(b)(1), unless the authorization is terminated or revoked.  Performed at [facility], [address], [place], [place] [address]   Urine culture     Status: Abnormal   Collection Time: 04/24/20  5:12 PM   Specimen: Urine, Random  Result Value Ref Range Status   Specimen Description   Final    URINE, RANDOM Performed at [facility], [address] [place], [place] [address]    Special Requests   Final    NONE Performed at  [facility], [address] [place], [place] [address]    Culture >=100,000 COLONIES/mL ESCHERICHIA COLI (A)  Final   Report Status 04/27/2020 FINAL  Final   Organism ID, Bacteria ESCHERICHIA COLI (A)  Final      Susceptibility   Escherichia coli - MIC*    AMPICILLIN >=32 RESISTANT Resistant     CEFAZOLIN <=4 SENSITIVE Sensitive     CEFEPIME <=0.12 SENSITIVE Sensitive     CEFTRIAXONE <=0.25 SENSITIVE Sensitive     CIPROFLOXACIN <=0.25 SENSITIVE Sensitive     GENTAMICIN <=1 SENSITIVE Sensitive     IMIPENEM <=0.25 SENSITIVE Sensitive     NITROFURANTOIN <=16 SENSITIVE Sensitive     TRIMETH/SULFA >=320 RESISTANT Resistant  AMPICILLIN/SULBACTAM 4 SENSITIVE Sensitive     PIP/TAZO <=4 SENSITIVE Sensitive     * >=100,000 COLONIES/mL ESCHERICHIA COLI  Blood culture (routine x 2)     Status: None   Collection Time: 04/25/20  8:37 AM   Specimen: BLOOD  Result Value Ref Range Status   Specimen Description BLOOD LH  Final   Special Requests   Final    BOTTLES DRAWN AEROBIC AND ANAEROBIC Blood Culture adequate volume   Culture   Final    NO GROWTH 5 DAYS Performed at [facility], [address] [place], [place] [address]    Report Status 04/30/2020 FINAL  Final  MRSA PCR Screening     Status: None   Collection Time: 04/25/20 10:26 PM   Specimen: Nasopharyngeal  Result Value Ref Range Status   MRSA by PCR NEGATIVE NEGATIVE Final    Comment:        The GeneXpert MRSA Assay (FDA approved for NASAL specimens only), is one component of a comprehensive MRSA colonization surveillance program. It is not intended to diagnose MRSA infection nor to guide or monitor treatment for MRSA infections. Performed at [facility], [address] [place], [place] [address]          Radiology Studies: DG Chest 1 View  Result Date: 04/29/2020 CLINICAL DATA:  Acute hypoxemic respiratory failure EXAM: CHEST  1 VIEW COMPARISON:  04/24/2020 FINDINGS: Single  frontal view of the chest demonstrates enteric catheter passing below diaphragm tip overlying the gastric body. The proximal extent of a left ureteral stent is identified. Continued patchy consolidation at the lung bases. Small left effusion. No pneumothorax. No acute bony abnormalities. IMPRESSION: 1. Patchy bibasilar consolidation which may reflect airspace disease or atelectasis. No change since prior exam. 2. Trace left pleural effusion. Electronically Signed   By: Sharlet Salina M.D.   On: 04/29/2020 19:25   IR GASTROSTOMY TUBE MOD SED  Result Date: 04/30/2020 INDICATION: 64 year old with ALS and dysphagia. EXAM: PERCUTANEOUS GASTROSTOMY TUBE WITH FLUOROSCOPIC GUIDANCE Physician: Rachelle Hora. Lowella Dandy, MD MEDICATIONS: Moderate sedation.  Patient is receiving scheduled IV antibiotics. ANESTHESIA/SEDATION: Versed 2.0 mg IV; Fentanyl 50 mcg IV Moderate Sedation Time:  18 minutes The patient was continuously monitored during the procedure by the interventional radiology nurse under my direct supervision. FLUOROSCOPY TIME:  Fluoroscopy Time: 3 minutes, 54 seconds, 17.9 mGy COMPLICATIONS: None immediate. PROCEDURE: Informed consent was obtained for a percutaneous gastrostomy tube. The patient was placed on the interventional table. An orogastric tube was placed with fluoroscopic guidance. The anterior abdomen was prepped and draped in sterile fashion. Maximal barrier sterile technique was utilized including caps, mask, sterile gowns, sterile gloves, sterile drape, hand hygiene and skin antiseptic. Stomach was inflated with air through the orogastric tube. The skin and subcutaneous tissues were anesthetized with 1% lidocaine. A 17 gauge needle was directed into the distended stomach with fluoroscopic guidance. A wire was advanced into the stomach and a T-tact was deployed. A 9-French vascular sheath was placed and the orogastric tube was snared using a [place]ck snare device. The orogastric tube and snare were pulled out  of the patient's mouth. The snare device was connected to a 20-French gastrostomy tube. The snare device and gastrostomy tube were pulled through the patient's mouth and out the anterior abdominal wall. The gastrostomy tube was cut to an appropriate length. Contrast injection through gastrostomy tube confirmed placement within the stomach. Fluoroscopic images were obtained for documentation. The gastrostomy tube was flushed with normal saline. IMPRESSION: Successful  fluoroscopic guided percutaneous gastrostomy tube placement. Electronically Signed   By: Richarda Overlie M.D.   On: 04/30/2020 11:14        Scheduled Meds: . amoxicillin-clavulanate  1 tablet Per Tube Q12H  . Chlorhexidine Gluconate Cloth  6 each Topical Daily  . free water  120 mL Per Tube 6 X Daily  . glycopyrrolate  0.1 mg Intravenous BID  . magic mouthwash w/lidocaine  5 mL Oral QID  . scopolamine  1 patch Transdermal Q72H   Continuous Infusions: . feeding supplement (JEVITY 1.2 CAL)    . potassium chloride 10 mEq (05/01/20 1058)     LOS: 6 days    Time spent: 36 minutes    Marrion Coy, MD [facility]   To contact the attending provider between 7A-7P or the covering provider during after hours 7P-7A, please log into the web site [email or web address] and access using universal [place] password for that web site. If you do not have the password, please call the hospital operator.  05/01/2020, 11:10 AM

## 2020-05-02 LAB — BASIC METABOLIC PANEL
Anion gap: 8 (ref 5–15)
BUN: 18 mg/dL (ref 8–23)
CO2: 38 mmol/L — ABNORMAL HIGH (ref 22–32)
Calcium: 8.5 mg/dL — ABNORMAL LOW (ref 8.9–10.3)
Chloride: 98 mmol/L (ref 98–111)
Creatinine, Ser: <0.3 mg/dL — ABNORMAL LOW (ref 0.44–1.00)
Glucose, Bld: 148 mg/dL — ABNORMAL HIGH (ref 70–99)
Potassium: 3.8 mmol/L (ref 3.5–5.1)
Sodium: 144 mmol/L (ref 135–145)

## 2020-05-02 LAB — GLUCOSE, CAPILLARY
Glucose-Capillary: 126 mg/dL — ABNORMAL HIGH (ref 70–99)
Glucose-Capillary: 155 mg/dL — ABNORMAL HIGH (ref 70–99)
Glucose-Capillary: 168 mg/dL — ABNORMAL HIGH (ref 70–99)
Glucose-Capillary: 136 mg/dL — ABNORMAL HIGH (ref 70–99)

## 2020-05-02 LAB — MAGNESIUM: Magnesium: 2 mg/dL (ref 1.7–2.4)

## 2020-05-02 LAB — PHOSPHORUS: Phosphorus: 2.8 mg/dL (ref 2.5–4.6)

## 2020-05-02 MED ORDER — AMOXICILLIN-POT CLAVULANATE 875-125 MG PO TABS: 1.0000 | ORAL_TABLET | Freq: Two times a day (BID) | ORAL | 0 refills | Status: DC

## 2020-05-02 MED ORDER — FREE WATER
40.0000 mL | Freq: Every day | Status: DC
  Administered 2020-05-02 – 2020-05-03 (×4): 40 mL

## 2020-05-02 MED ORDER — JEVITY 1.2 CAL PO LIQD
1000.0000 mL | ORAL | Status: DC
  Administered 2020-05-02 – 2020-05-03 (×4): 1000 mL

## 2020-05-02 MED ORDER — FUROSEMIDE 10 MG/ML IJ SOLN
60.0000 mg | Freq: Once | INTRAMUSCULAR | Status: AC
  Administered 2020-05-02: 14:00:00 60 mg via INTRAVENOUS
  Filled 2020-05-02: qty 6

## 2020-05-02 MED ORDER — HYDROXYCHLOROQUINE SULFATE 200 MG PO TABS: 200.0000 mg | ORAL_TABLET | Freq: Every day | ORAL | Status: AC

## 2020-05-02 MED ORDER — RILUZOLE 50 MG PO TABS: 50.0000 mg | ORAL_TABLET | Freq: Two times a day (BID) | ORAL | Status: AC

## 2020-05-02 MED ORDER — AMOXICILLIN-POT CLAVULANATE 875-125 MG PO TABS: 1.0000 | ORAL_TABLET | Freq: Two times a day (BID) | ORAL | 0 refills | Status: AC

## 2020-05-02 MED ORDER — SCOPOLAMINE 1 MG/3DAYS TD PT72: 1.0000 | MEDICATED_PATCH | TRANSDERMAL | 0 refills | Status: AC

## 2020-05-02 MED ORDER — JEVITY 1.2 CAL PO LIQD
237.0000 mL | ORAL | Status: DC
  Administered 2020-05-02: 237 mL

## 2020-05-02 NOTE — Progress Notes (Signed)
Spoke with spouse in patients room regarding transportation to home via EMS and need for someone to be at the home to receive patient.

## 2020-05-02 NOTE — TOC Progression Note (Addendum)
Transition of Care [facility]) - Progression Note    Patient Details  Name: Amy Gregory MRN: [id number] Date of Birth: 1956/02/02  Transition of [facility]) CM/SW Contact  Bing Quarry, RN Phone Number: 05/02/2020, 9:33 AM  Clinical Narrative:   12/26 1415 Update: Pt not tolerating bolus feedings per unit RN assessment and provider notifications. Discharge canceled for today. [facility] and Elnita Maxwell at [facility] notified. Will continue to monitor for TOC needs. Gabriel Cirri RN CM  1313 Update. Due to every 4 hour tube feeding needs, and last call from [facility] that [facility] RN had to be ins. re-certified and cannot come to patient home until Monday am, patient will be transported home at 1800 today in order to receive two more tube feedings today per provider via secure chat. Unit RN in communication as well. [facility] notified of change in transport and timing issues. Gabriel Cirri RN CM   12/26 1250 [facility] notified of transport. Unit RN notified. Spoke with spouse earlier and he is leaving now to be home when EMS arrives. All agencies aware of discharge today. No oxygen ordered. Gabriel Cirri RN CM  12/26 1238 Cheryl/[facility] returned call and RN will be out to patient's home today/this afternoon.   12/26 Spoke with Elnita Maxwell from [place] and RN actually not yet arranged for today (was for anticipated DC yesterday) for needed tube feedings/care. Is checking and will call CM back to confirm as TF primary feedings/fluids. Gabriel Cirri [facility]  12/26 1116. [name] has confirmed discharge readiness to accept as well as HH agency [facility] via [place]. Need spouse to confirm will be at home to receive patient and HH. Gabriel Cirri RN CM   12/26 1018 Left VM with spouse home number to call CM for notification of transfer. Also called room but no answer. Mobile phone had no voice mail set up. Gabriel Cirri RN CM   12/26 0950 am.  Discharge orders written for today.   Awaiting Home oxygen evaluation screen and  will contact DME oxygen after orders received.   [facility]ed [facility] and they are ready to accept today.   Contacted HH agency and waiting confirmation ready to accept at home today.   Left VM. Will monitor.   [facility] non emergent transport is operating today and notified of potential need.  Gabriel Cirri RN CM    Expected Discharge Plan: Home w Home Health Services Barriers to Discharge: Continued Medical Work up  Expected Discharge Plan and Services Expected Discharge Plan: Home w Home Health Services   Discharge Planning Services: CM Consult Post Acute Care Choice: Home Health Living arrangements for the past 2 months: Single Family Home Expected Discharge Date: 05/02/20               DME Arranged: Tamsen Snider feeding DME Agency: Other - Comment Date DME Agency Contacted: 04/30/20 Time DME Agency Contacted: 9 Representative spoke with at DME Agency: Jeri Modena with [facility] HH Arranged: RN,PT,OT,Nurse's Aide,Social Work [employer] Agency: [employer] [facility] Date HH Agency Contacted: 04/27/20 Time HH Agency Contacted: 1520 Representative spoke with at [facility] Agency: Elnita Maxwell   Social Determinants of Health (SDOH) Interventions    Readmission Risk Interventions No flowsheet data found.

## 2020-05-02 NOTE — Progress Notes (Addendum)
Notified that the patient in distress.  Patient has a lot of ronchi and secretions, but unable to cough.  O2 sats 92% on RA.  This is a change from earlier.  Currently she is sitting in the bed at 90 degrees. Spouse at the bedside.  Feeding and water via tube may be too frequent.  Dr. Chipper Herb verbalized that he will place orders.   Discharge order was discontinued. Lasix IV given.   Notified Dr. Chipper Herb that lung sounds improved, but O2 sats 88% on RA. 2L O2 per [place] applied, O2 sats up to 93%.    Per MD ok to start water and feeding via PEG as scheduled. Per MD to increase tube feeding rate by 60ml qx4hrs with the goal 72ml per hr.

## 2020-05-02 NOTE — Discharge Summary (Addendum)
Physician Discharge Summary  Patient ID: Amy Gregory MRN: [id number] DOB/AGE: 01-19-1956 64 y.o.  Admit date: 04/24/2020 Discharge date: 05/02/2020  Admission Diagnoses:  Discharge Diagnoses:  Principal Problem:   Aspiration pneumonia (HCC) Active Problems:   Rheumatoid arthritis (HCC)   Essential hypertension, benign   Amyotrophic lateral sclerosis (ALS) (HCC)   Hypokalemia   Hyperbilirubinemia   Acute on chronic respiratory failure with hypoxia (HCC)   Left ureteral stone   E. coli UTI   Discharged Condition: poor  Hospital Course:  Patient is a 64 year old female with history of left foot drop, ALS, bilateral hearing loss, dysphagia, GERD, hiatal hernia, impaired memory, nephrolithiasis, osteoarthritis, rheumatoid arthritis who presents to the emergency room with complaints of generalized weakness, diarrhea, increased secretions and dysphagia.Marland KitchenOn presentation she was hypertensive. She was noted to have hypokalemia. Urinalysis was suspicious for UTI. She had mild leukocytosis. Chest x-ray also showed right lung base consolidation. Patient had a Dobbhoff placed on 12/22, started on tube feeding. PEG tube placed on 12/24.  Start continue tube feeding on 12/25.  #1. Acute hypoxemic respiratory failure. Aspiration pneumonia secondary to dysphagia. Dysphagia secondary to ALS. Patient has PEG tube placed and started tube feeding.  She tolerated continuous tube feeding yesterday, switched to bolus feeding today. Continue Augmentin via feeding tube. Due to recurrent aspiration, patient long-term prognosis is poor.    Her underlying disease cannot be treated, even with the tube feeding, she most likely will continue to develop aspiration pneumonia in the future.  Will be followed by palliative care after discharge. Currently, patient on 3 L oxygen per saturation at 100%.  Will obtain home oxygen evaluation before discharge.  We will also set up home care nurse, physical therapy  and speech therapy.  Prescription for Augmentin resent for 7 days.  #2.  Hypokalemia and hypophosphatemia. Supplemented potassium and phosphorus.  Potassium and phosphorus level normal today.  #3.  Mild hypernatremia. Free water given for tube feeding.  Sodium level has normalized.  4.  E. coli urinary tract infection. Left hydronephrosis with obstructing kidney stone.  Status post ureteral stent. Antibiotic changed to Augmentin. Patient be followed by urology as outpatient.  Addendum: Could not tolerate bolus feeding, she developed secondary short of breath after bolus feeding.  Change to continuous feeding started about 30 mL/h, reduce free water.  Give 60 mg of Lasix.  Discussed with family, cancel discharge today, reconsider discharge tomorrow with continuous feeding if she improves.   Consults: urology  Significant Diagnostic Studies:  CT ABDOMEN WITHOUT CONTRAST  TECHNIQUE: Multidetector CT imaging of the abdomen was performed following the standard protocol without IV contrast.  COMPARISON:  Sep 20, 2015 and July 15, 2014  FINDINGS: Lower chest: Basilar airspace disease bilaterally. Trace pleural fluid.  Hepatobiliary: Sludge in the gallbladder. Lobular hepatic contours, suggestion of background hepatic steatosis.  Pancreas: Normal appearance of the pancreas with mild fatty atrophy about the pancreatic head.  Spleen: Normal  Adrenals/Urinary Tract: Adrenal glands are normal.  Nephrolithiasis bilaterally. The LEFT proximal ureteral calculi the largest measuring 5 x 7 mm, a smaller calculus just inferior to this approximately 3 x 4 mm.  Mild-to-moderate LEFT-sided hydroureteronephrosis and signs of LEFT-sided perinephric and Peri ureteral stranding.  Intrarenal calculi on the LEFT, 2 remaining in the kidney largest approximately 3 mm. Approximately 2 mm calculus in the interpolar RIGHT kidney and a 3-4 mm lower pole calculus on the  RIGHT.  Stomach/Bowel: Normal appendix. Colonic diverticulosis. Question mild perienteric stranding in the RIGHT lower quadrant.  Some signs of mural stratification are also suggested on noncontrast imaging which limits assessment.  Vascular/Lymphatic: Normal caliber abdominal aorta. There is no gastrohepatic or hepatoduodenal ligament lymphadenopathy. No retroperitoneal or mesenteric lymphadenopathy.  Other: No visible ascites.  No signs of free air.  Musculoskeletal: No acute bone finding or destructive bone process.  IMPRESSION: 1. LEFT ureteral calculi with signs of obstruction. 2. Nephrolithiasis bilaterally. 3. Question of mild enteritis or sequela of prior enteritis in the RIGHT lower quadrant, not well evaluated. The appendix is visualized and is normal. There are signs of enteritis noted on in April 26 evaluation from 2018. Correlate with any ongoing symptoms. 4. Basilar airspace disease bilaterally, concerning for pneumonia, would also consider aspiration. 5. Lobular hepatic contours, suggestion of background hepatic steatosis. 6. Sludge in the gallbladder. 7. Aortic atherosclerosis.  Aortic Atherosclerosis (ICD10-I70.0).   Electronically Signed   By: Donzetta Kohut M.D.   On: 04/27/2020 16:41   Treatments: PEG placement, antibiotics  Discharge Exam: Blood pressure (!) 159/78, pulse 66, temperature 97.8 F (36.6 C), resp. rate 16, height  (1.676 m), weight 79.4 kg, SpO2 100 %. General appearance: alert, cooperative and aphasic Resp: clear to auscultation bilaterally Cardio: regular rate and rhythm, S1, S2 normal, no murmur, click, rub or gallop GI: soft, non-tender; bowel sounds normal; no masses,  no organomegaly Extremities: extremities normal, atraumatic, no cyanosis or edema  Disposition: Discharge disposition: 01-Home or Self Care       Discharge Instructions    Diet general   Complete by: As directed    NPO, Tube feeding  only. Jevity 1.2 1 can 6 times per day Free water 6 times per day   Increase activity slowly   Complete by: As directed    As tolerated, elevate head of bed at 45 degree when in bed.     Allergies as of 05/02/2020      Reactions   Sulfa Antibiotics    Cefdinir Rash   Oxycodone Rash   PER PATIENT ON 7.25.18      Medication List    STOP taking these medications   ADV TURMERIC CURCUMIN COMPLEX PO   cholecalciferol 1000 units tablet Commonly known as: VITAMIN D   gabapentin 100 MG capsule Commonly known as: NEURONTIN   multivitamin capsule   pantoprazole 40 MG tablet Commonly known as: PROTONIX   vitamin C 100 MG tablet     TAKE these medications   amoxicillin-clavulanate 875-125 MG tablet Commonly known as: AUGMENTIN Place 1 tablet into feeding tube every 12 (twelve) hours for 4 days.   hydroxychloroquine 200 MG tablet Commonly known as: PLAQUENIL Place 1 tablet (200 mg total) into feeding tube daily. What changed: how to take this   riluzole 50 MG tablet Commonly known as: RILUTEK Place 1 tablet (50 mg total) into feeding tube every 12 (twelve) hours. What changed: how to take this   scopolamine 1 MG/3DAYS Commonly known as: TRANSDERM-SCOP Place 1 patch (1.5 mg total) onto the skin every 3 (three) days. Start taking on: May 03, 2020       Follow-up Information    Danelle Berry, PA-C Follow up.   Specialty: Family Medicine Contact information: [address] [place] [address] [phone]        Sondra Come, MD Follow up in 2 week(s).   Specialty: Urology Contact information: [address] [place] [address] [phone]              40 minutes Signed:  Demico Ploch 05/02/2020, 8:41 AM

## 2020-05-02 NOTE — Progress Notes (Signed)
SATURATION QUALIFICATIONS: (This note is used to comply with regulatory documentation for home oxygen)  Patient Saturations on Room Air at Rest = 96%  Patient Saturations on Room Air while Ambulating = cant ambulate, bedbound

## 2020-05-03 ENCOUNTER — Other Ambulatory Visit: Payer: Medicare HMO

## 2020-05-03 LAB — BASIC METABOLIC PANEL
Anion gap: 9 (ref 5–15)
BUN: 20 mg/dL (ref 8–23)
CO2: 35 mmol/L — ABNORMAL HIGH (ref 22–32)
Calcium: 8.5 mg/dL — ABNORMAL LOW (ref 8.9–10.3)
Chloride: 94 mmol/L — ABNORMAL LOW (ref 98–111)
Creatinine, Ser: <0.3 mg/dL — ABNORMAL LOW (ref 0.44–1.00)
Glucose, Bld: 146 mg/dL — ABNORMAL HIGH (ref 70–99)
Potassium: 3.5 mmol/L (ref 3.5–5.1)
Sodium: 138 mmol/L (ref 135–145)

## 2020-05-03 LAB — GLUCOSE, CAPILLARY
Glucose-Capillary: 157 mg/dL — ABNORMAL HIGH (ref 70–99)
Glucose-Capillary: 150 mg/dL — ABNORMAL HIGH (ref 70–99)

## 2020-05-03 LAB — MAGNESIUM: Magnesium: 1.9 mg/dL (ref 1.7–2.4)

## 2020-05-03 LAB — PHOSPHORUS: Phosphorus: 3.3 mg/dL (ref 2.5–4.6)

## 2020-05-03 MED ORDER — JEVITY 1.2 CAL PO LIQD: 1000.0000 mL | ORAL | Status: DC

## 2020-05-03 MED ORDER — FREE WATER: 60.0000 mL | Freq: Every day | Status: DC

## 2020-05-03 MED ORDER — FREE WATER: 40.0000 mL | Freq: Every day | Status: AC

## 2020-05-03 MED ORDER — JEVITY 1.5 CAL/FIBER PO LIQD: 1000.0000 mL | ORAL | Status: DC

## 2020-05-03 MED ORDER — JEVITY 1.5 CAL/FIBER PO LIQD
1000.0000 mL | ORAL | Status: DC
  Administered 2020-05-03: 09:00:00 1000 mL

## 2020-05-03 MED ORDER — FREE WATER: 60.0000 mL | Freq: Every day | Status: AC

## 2020-05-03 MED ORDER — JEVITY 1.5 CAL/FIBER PO LIQD: 1000.0000 mL | ORAL | Status: AC

## 2020-05-03 NOTE — Plan of Care (Signed)

## 2020-05-03 NOTE — Progress Notes (Signed)
Nutrition Follow-up  DOCUMENTATION CODES:   Not applicable  INTERVENTION:  Initiate Jevity 1.5 Cal at 40 mL/hr per G-tube and after 4 hours advance to goal rate of 60 mL/hr. Provides 2160 kcal, 92 grams of protein, 1094 mL H2O daily.  With free water flush of 60 mL 6 times daily per tube patient will receive a total of 1454 mL H2O daily including water in tube feed regimen.  NUTRITION DIAGNOSIS:   Inadequate oral intake related to inability to eat,dysphagia as evidenced by NPO status.  Ongoing.  GOAL:   Patient will meet greater than or equal to 90% of their needs  Progressing with advancement of tube feed regimen.  MONITOR:   TF tolerance,Labs,Weight trends,I & O's  REASON FOR ASSESSMENT:   Consult Enteral/tube feeding initiation and management  ASSESSMENT:   64 y.o. female with medical history significant of acquired left foot drop, ALS, bilateral hearing loss, dysphagia, GERD, hiatal hernia, diverticulosis/diverticulitis, impaired memory, nephrolithiasis, osteoarthritis, osteoporosis, rheumatoid arthritis who is coming to the emergency department due to generalized weakness, associated with diarrhea, increased secretions and inability to swallow safely.  12/21 s/p left ureteral stent placement 12/22 NGT placed and continuous TFs initiated 12/24 s/p placement of G-tube  12/25 initiated on bolus regimen  Per review of chart patient did not tolerate bolus regimen over the weekend. Per MD patient had worsening oxygenation, volume overload, and required Lasix. Patient was changed back to continuous regimen with Jevity 1.2 Cal and advanced up to 40 mL/hr, which she was at this morning at time of RD assessment. Patient is tolerating continuous tube feeds at this time. Messaged MD regarding goal regimen. Plan is to initiate Jevity 1.5 Cal at 40 mL/hr and advance to goal regimen of 60 mL/hr. MD has reduced free water flush. Plan is for patient to discharge home today. All orders  updated in chart. Per chart plan is for [name] to provide teaching with husband at noon today prior to discharge. Husband was not present in room at time of RD assessment. Per chart plan is to be followed by outpatient palliative.  Medications reviewed and include: glycopyrrolate, free water 60 mL 6 times daily.  Labs reviewed: CBG 136-168, Chloride 94, CO2 35, Creatinine <0.3. Potassium, Phosphorus, and Magnesium WNL.  Enteral Access: 20 Fr. G-tube placed 12/24 by IR  Discussed with RN. Discussed with MD and Case Manager via secure chat.  Diet Order:   Diet Order            Diet general           Diet NPO time specified Except for: Ice Chips  Diet effective now                EDUCATION NEEDS:   No education needs have been identified at this time  Skin:  Skin Assessment: Reviewed RN Assessment  Last BM:  12/23- type 6  Height:   Ht Readings from Last 1 Encounters:  04/24/20 5\' 6"  (1.676 m)   Weight:   Wt Readings from Last 1 Encounters:  05/02/20 76.4 kg   BMI:  Body mass index is 27.19 kg/m.  Estimated Nutritional Needs:   Kcal:  1900-2100 kcal  Protein:  95-105 grams  Fluid:  1.8-2.0 L/day  05/04/20, MS, RD, LDN Pager number available on Amion

## 2020-05-03 NOTE — Care Management Important Message (Signed)
Important Message  Patient Details  Name: Amy Gregory MRN: [id number] Date of Birth: [birth year]   Medicare Important Message Given:  Yes     Johnell Comings 05/03/2020, 10:58 AM

## 2020-05-03 NOTE — TOC Transition Note (Signed)
Transition of Care [facility]) - CM/SW Discharge Note   Patient Details  Name: Amy Gregory MRN: [id number] Date of Birth: February 11, 1956  [facility] CM/SW Contact:  Allayne Butcher, RN Phone Number: 05/03/2020, 10:46 AM   Clinical Narrative:    Patient is medically cleared for discharge home today with tube feeds and home health services.  Patient will require continuous feeds at home.  [name] with [facility] will be meeting with the patient and patient's husband at the bedside around noon today to do teaching on continuous tube feeds.  RNCM has arranged transport home with [facility] for 1330.  Amy Gregory, patient's husband is aware of discharge plan and verbalizes agreement and understanding.  Becky Sax with [facility] is aware of DC today also.   Dayna Barker with [facility] Palliative notified of DC today.    Final next level of care: Home w Home Health Services Barriers to Discharge: Barriers Resolved   Patient Goals and CMS Choice Patient states their goals for this hospitalization and ongoing recovery are:: To be home for Christmas CMS Medicare.gov Compare Post Acute Care list provided to:: Patient Represenative (must comment) Choice offered to / list presented to : Patient,Spouse  Discharge Placement                Patient to be transferred to facility by: Transport home by [facility] Name of family member notified: Amy Gregory Patient and family notified of of transfer: 05/03/20  Discharge Plan and Services   Discharge Planning Services: CM Consult Post Acute Care Choice: Home Health          DME Arranged: Tube feeding,Tube feeding pump DME Agency: Other - Comment ([facility]) Date DME Agency Contacted: 05/03/20 Time DME Agency Contacted: 0900 Representative spoke with at DME Agency: [name] HH Arranged: RN,OT,Speech Therapy,Nurse's Aide,Social Work [employer] Agency: [facility] Date HH Agency  Contacted: 05/03/20 Time HH Agency Contacted: 1045 Representative spoke with at [facility] Agency: Elnita Maxwell  Social Determinants of Health (SDOH) Interventions     Readmission Risk Interventions No flowsheet data found.

## 2020-05-03 NOTE — Discharge Summary (Signed)
Physician Discharge Summary  Patient ID: Amy Gregory MRN: [id number] DOB/AGE: January 29, 1956 64 y.o.  Admit date: 04/24/2020 Discharge date: 05/03/2020  Admission Diagnoses:  Discharge Diagnoses:  Principal Problem:   Aspiration pneumonia (HCC) Active Problems:   Rheumatoid arthritis (HCC)   Essential hypertension, benign   Amyotrophic lateral sclerosis (ALS) (HCC)   Hypokalemia   Hyperbilirubinemia   Acute on chronic respiratory failure with hypoxia (HCC)   Left ureteral stone   E. coli UTI   Discharged Condition: poor  Hospital Course:  Patient is a 64 year old female with history of left foot drop, ALS, bilateral hearing loss, dysphagia, GERD, hiatal hernia, impaired memory, nephrolithiasis, osteoarthritis, rheumatoid arthritis who presents to the emergency room with complaints of generalized weakness, diarrhea, increased secretions and dysphagia.Marland KitchenOn presentation she was hypertensive. She was noted to have hypokalemia. Urinalysis was suspicious for UTI. She had mild leukocytosis. Chest x-ray also showed right lung base consolidation. Patient had a Dobbhoff placed on 12/22, started on tube feeding. PEG tube placed on 12/24. Start continue tube feeding on 12/25.  #1. Acute hypoxemic respiratory failure. Aspiration pneumonia secondary to dysphagia. Dysphagia secondary to ALS. Patient has PEG tube placed and started tube feeding.  She tolerated continuous tube feeding yesterday, switched to bolus feeding today. Continue Augmentin via feeding tube. Due to recurrent aspiration, patient long-term prognosis is poor.  Her underlying disease cannot be treated, even with the tube feeding, she most likely will continue to develop aspiration pneumonia in the future. Will be followed by palliative care after discharge. Currently, patient on 3 L oxygen per saturation at 100%.  Will obtain home oxygen evaluation before discharge.  We will also set up home care nurse, physical therapy  and speech therapy.  Prescription for Augmentin resent for 7 days.  #2. Hypokalemia and hypophosphatemia. Supplemented potassium and phosphorus.  Potassium and phosphorus level normal today.  #3. Mild hypernatremia. Free water given for tube feeding.  Sodium level has normalized.  4. E. coli urinary tract infection. Left hydronephrosis with obstructing kidney stone. Status post ureteral stent. Antibiotic changed to Augmentin. Patient be followed by urology as outpatient.  Patient was scheduled to be discharged on 12/26, she developed volume overload after bolus feeding in a few hours. She received IV Lasix. Then will switch back to continuous feeding again. She tolerated tube feeding since then. Discussed with dietitian, feeding has changed to continuous feeding. I reexamined patient today, she still is stable to be discharged. She was put back on oxygen yesterday after the episode of volume overload, will reassess for home oxygen use.    Consults: urology  Significant Diagnostic Studies:  CT ABDOMEN WITHOUT CONTRAST  TECHNIQUE: Multidetector CT imaging of the abdomen was performed following the standard protocol without IV contrast.  COMPARISON: Sep 20, 2015 and July 15, 2014  FINDINGS: Lower chest: Basilar airspace disease bilaterally. Trace pleural fluid.  Hepatobiliary: Sludge in the gallbladder. Lobular hepatic contours, suggestion of background hepatic steatosis.  Pancreas: Normal appearance of the pancreas with mild fatty atrophy about the pancreatic head.  Spleen: Normal  Adrenals/Urinary Tract: Adrenal glands are normal.  Nephrolithiasis bilaterally. The LEFT proximal ureteral calculi the largest measuring 5 x 7 mm, a smaller calculus just inferior to this approximately 3 x 4 mm.  Mild-to-moderate LEFT-sided hydroureteronephrosis and signs of LEFT-sided perinephric and Peri ureteral stranding.  Intrarenal calculi on the LEFT, 2 remaining  in the kidney largest approximately 3 mm. Approximately 2 mm calculus in the interpolar RIGHT kidney and a 3-4 mm lower pole  calculus on the RIGHT.  Stomach/Bowel: Normal appendix. Colonic diverticulosis. Question mild perienteric stranding in the RIGHT lower quadrant. Some signs of mural stratification are also suggested on noncontrast imaging which limits assessment.  Vascular/Lymphatic: Normal caliber abdominal aorta. There is no gastrohepatic or hepatoduodenal ligament lymphadenopathy. No retroperitoneal or mesenteric lymphadenopathy.  Other: No visible ascites. No signs of free air.  Musculoskeletal: No acute bone finding or destructive bone process.  IMPRESSION: 1. LEFT ureteral calculi with signs of obstruction. 2. Nephrolithiasis bilaterally. 3. Question of mild enteritis or sequela of prior enteritis in the RIGHT lower quadrant, not well evaluated. The appendix is visualized and is normal. There are signs of enteritis noted on in April 26 evaluation from 2018. Correlate with any ongoing symptoms. 4. Basilar airspace disease bilaterally, concerning for pneumonia, would also consider aspiration. 5. Lobular hepatic contours, suggestion of background hepatic steatosis. 6. Sludge in the gallbladder. 7. Aortic atherosclerosis.  Aortic Atherosclerosis (ICD10-I70.0).   Electronically Signed By: Donzetta Kohut M.D. On: 04/27/2020 16:41  Treatments: Ureteral stent, PEG tube, antibiotics.  Discharge Exam: Blood pressure 128/74, pulse 83, temperature 97.8 F (36.6 C), resp. rate 14, height 5\' 6"  (1.676 m), weight 76.4 kg, SpO2 99 %. General appearance: alert, cooperative and Aphasic. Resp: clear to auscultation bilaterally Cardio: regular rate and rhythm, S1, S2 normal, no murmur, click, rub or gallop GI: soft, non-tender; bowel sounds normal; no masses,  no organomegaly Extremities: extremities normal, atraumatic, no cyanosis or edema  Disposition:  Discharge disposition: 01-Home or Self Care       Discharge Instructions    Diet general   Complete by: As directed    NPO. Tube feeding: Jevity 1.5, 50ml/hr, increase to 24ml/hr 12/28. Free water 60 ml 6 times a day.   Increase activity slowly   Complete by: As directed    As tolerated, elevate head of bed at 45 degree when in bed.   Increase activity slowly   Complete by: As directed      Allergies as of 05/03/2020      Reactions   Sulfa Antibiotics    Cefdinir Rash   Oxycodone Rash   PER PATIENT ON 7.25.18      Medication List    STOP taking these medications   ADV TURMERIC CURCUMIN COMPLEX PO   cholecalciferol 1000 units tablet Commonly known as: VITAMIN D   gabapentin 100 MG capsule Commonly known as: NEURONTIN   multivitamin capsule   pantoprazole 40 MG tablet Commonly known as: PROTONIX   vitamin C 100 MG tablet     TAKE these medications   amoxicillin-clavulanate 875-125 MG tablet Commonly known as: AUGMENTIN Place 1 tablet into feeding tube every 12 (twelve) hours for 7 days.   feeding supplement (JEVITY 1.5 CAL/FIBER) Liqd Place 1,000 mLs into feeding tube continuous.   free water Soln Place 40 mLs into feeding tube 6 (six) times daily.   hydroxychloroquine 200 MG tablet Commonly known as: PLAQUENIL Place 1 tablet (200 mg total) into feeding tube daily. What changed: how to take this   riluzole 50 MG tablet Commonly known as: RILUTEK Place 1 tablet (50 mg total) into feeding tube every 12 (twelve) hours. What changed: how to take this   scopolamine 1 MG/3DAYS Commonly known as: TRANSDERM-SCOP Place 1 patch (1.5 mg total) onto the skin every 3 (three) days.       Follow-up Information    Danelle Berry, PA-C Follow up.   Specialty: Family Medicine Contact information: [address] [place]  [address] [phone]        Sondra Come, MD Follow up in 2 week(s).   Specialty: Urology Contact  information: [address] [place] [address] [phone]              More than 30 minutes spent on coordination of patient discharge. Signed: Marrion Coy 05/03/2020, 9:26 AM

## 2020-05-04 ENCOUNTER — Telehealth: Payer: Self-pay

## 2020-05-04 ENCOUNTER — Ambulatory Visit: Payer: Self-pay | Admitting: Family Medicine

## 2020-05-04 NOTE — Telephone Encounter (Signed)
Transition Care Management Follow-up Telephone Call  Date of discharge and from where: [facility] on 05/03/20  How have you been since you were released from the hospital? Per husband pt is still having a lot of lower abdominal pain. Current pain level is an 8 that is constant. Pt is not taking any prescription or OTC pain meds. Pt has only had 1 episode of diarrhea since d/c. Pt c/o of being nausea and is still weak. Husband is still having to suction her mouth from secretions but states it is not as bad as previously. Declines fever, SOB, urinary s/s, vomiting, pain elsewhere, cough or congestions.   Any questions or concerns? No   Items Reviewed:  Did the pt receive and understand the discharge instructions provided? Yes   Medications obtained and verified? No, husband declined reviewing all medications over the phone. Did verify all new, changed or stopped medications.   Any new allergies since your discharge? No   Dietary orders reviewed? Yes  Do you have support at home? Yes   Other (ie: DME, Home Health, etc): Feeding tube equipment was sent home. [facility] was already visiting with pt. Pre d/c summary HH and pallative care was to be ordered. Husband has not heard from either at this point.   Functional Questionnaire: (I = Independent and D = Dependent)  Bathing/Dressing- D   Meal Prep- D  Eating- D, has a feeding tube.  Maintaining continence- D  Transferring/Ambulation- D  Managing Meds- D   Follow up appointments reviewed:    PCP Hospital f/u appt confirmed? No  , no availability with PCP in next 2 weeks. Husband unsure if patient needs to come in due to her not getting out of bed. Husband to call the office for apt.   Specialist Hospital f/u appt confirmed? Yes  scheduled to see Dr Richardo Hanks on 05/20/2020 @ 2:30 PM.  Are transportation arrangements needed? No   If their condition worsens, is the pt aware to call  their PCP or go to the ED? Yes  Was the patient  provided with contact information for the PCP's office or ED? Yes  Was the pt encouraged to call back with questions or concerns? Yes

## 2020-05-04 NOTE — Telephone Encounter (Signed)
Husband called for pt. (on DPR)  Reported that pt. Was discharged from hospital yesterday, and was supposed to be sent home on O2.  Husband reported that her "phlegm and congestion" is worse.  Husband stated she was working harder to breathe.  Does not have Pulse ox. Monitor at home.  VS @ 4:25 PM: BP 134/75, Pulse 99, resp. 28-32.  Reported no fever.  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  Reported color pale and skin dry.  Stated pt. Reported she feels a little worse than yesterday as far as her breathing is concerned.  Initially, husband reported that pt. Was having "moderate" shortness of breath.  Then, husband said over past hour, he felt like her breathing was less labored, and the wheezing had decreased.  Husband is suctioning pt. as needed; reported secretions are "clear to cloudy".  Phone call to [facility] at office.  Advised that husband was concerned about getting O2 for his wife.  Unable to determine from hospital discharge note, if pt. Was evaluated for home O2.  Per FC, there are no nurses or providers in the office this afternoon.  Advised husband to monitor pt. For any decline in her condition; advised to monitor for labored breathing, increased congestion or wheezing, change in her color to more pale/ dusky appearance, or any signs of distress, and to call EMS, to take pt. To ER.  Husband verb. Understanding. Husband stated pt. Does not want to go back to the hospital.     Reason for Disposition . [1] MILD difficulty breathing (e.g., minimal/no SOB at rest, SOB with walking, pulse <100) AND [2] NEW-onset or WORSE than normal    Husband reported pt. Was in some distress earlier, and now stated her wheezing is better.  Answer Assessment - Initial Assessment Questions 1. RESPIRATORY STATUS: "Describe your breathing?" (e.g., wheezing, shortness of breath, unable to speak, severe coughing)      Per husband - she has a lot of phlegm and mucus in her throat 2. ONSET: "When did this  breathing problem begin?"      Noted increased congestion today; was discharged from hospital yesterday 3. PATTERN "Does the difficult breathing come and go, or has it been constant since it started?"      Husband has noticed increased congestion today with increase effort to breathe  4. SEVERITY: "How bad is your breathing?" (e.g., mild, moderate, severe)    - MILD: No SOB at rest, mild SOB with walking, speaks normally in sentences, can lay down, no retractions, pulse < 100.    - MODERATE: SOB at rest, SOB with minimal exertion and prefers to sit, cannot lie down flat, speaks in phrases, mild retractions, audible wheezing, pulse 100-120.    - SEVERE: Very SOB at rest, speaks in single words, struggling to breathe, sitting hunched forward, retractions, pulse > 120      moderate 5. RECURRENT SYMPTOM: "Have you had difficulty breathing before?" If Yes, ask: "When was the last time?" and "What happened that time?"     Pt. Was in the hospital for aspiration pneumonia     6. CARDIAC HISTORY: "Do you have any history of heart disease?" (e.g., heart attack, angina, bypass surgery, angioplasty)      *No Answer* 7. LUNG HISTORY: "Do you have any history of lung disease?"  (e.g., pulmonary embolus, asthma, emphysema)     Was in hospital with Aspiration pneumonia; has ALS so unable to cough up mucus 8. CAUSE: "What do  you think is causing the breathing problem?"      Recent Aspiration pneumonia 9. OTHER SYMPTOMS: "Do you have any other symptoms? (e.g., dizziness, runny nose, cough, chest pain, fever)     Pale in color; reported skin is dry. Denied fever; husband reported pt. feeling some worse with her breathing; husband reported increased congestion and phlegm, with increased breathing.  Husband checked vital signs: BP 134/75, pulse 99, resp. 28-32.  10. PREGNANCY: "Is there any chance you are pregnant?" "When was your last menstrual period?"       N/a  11. TRAVEL: "Have you traveled out of the country  in the last month?" (e.g., travel history, exposures)       no  Protocols used: BREATHING DIFFICULTY-A-AH

## 2020-05-05 ENCOUNTER — Telehealth: Payer: Self-pay

## 2020-05-05 ENCOUNTER — Telehealth: Payer: Self-pay | Admitting: Family Medicine

## 2020-05-05 NOTE — Telephone Encounter (Signed)
Copied from CRM [id number]. Topic: Quick Communication - See Telephone Encounter >> May 05, 2020  9:20 AM Aretta Nip wrote: CRM for notification. See Telephone encounter for: 05/05/20. [facility], [name] calling re referral for their services, [name] states on review that this pt is not a candidate for their services as she needs more palliative care. If you have questions you may call [name] at [phone]

## 2020-05-05 NOTE — Telephone Encounter (Signed)
Amy Gregory, from [facility], called stating that they spoke with [name]. She states that they are going to be accepting the pt as palliative care and hh orders. She states that [name] will be sending over paperwork to get the orders from PCP. Please advise.

## 2020-05-05 NOTE — Telephone Encounter (Signed)
FYI

## 2020-05-05 NOTE — Telephone Encounter (Signed)
error 

## 2020-05-06 ENCOUNTER — Other Ambulatory Visit: Payer: Self-pay | Admitting: Emergency Medicine

## 2020-05-06 NOTE — Telephone Encounter (Signed)
Pt made appt for virtual 05-11-2020 for husband said that she is bed ridden . Per Sheliah Mends make it with [facility]

## 2020-05-06 NOTE — Progress Notes (Unsigned)
la 

## 2020-05-06 NOTE — Telephone Encounter (Signed)
Please schedule appointment.

## 2020-05-10 NOTE — Progress Notes (Deleted)
Patient is a 65 y.o. female patient of Amy Gregory Follows up today as a post hospital follow-up Pt made appt for virtual 05-11-2020 for husband said that she is bed ridden . Per Kristeen Miss make it with [facility]  The discharge summary from that is as follows:  Admit date: 04/24/2020 Discharge date: 05/03/2020  Admission Diagnoses:  Discharge Diagnoses:  Principal Problem:   Aspiration pneumonia ([place]) Active Problems:   Rheumatoid arthritis ([place])   Essential hypertension, benign   Amyotrophic lateral sclerosis (ALS) ([facility])   Hypokalemia   Hyperbilirubinemia   Acute on chronic respiratory failure with hypoxia ([facility])   Left ureteral stone   E. coli UTI   Discharged Condition: poor  Hospital Course:  Patient is a 65 year old female with history of left foot drop, ALS, bilateral hearing loss, dysphagia, GERD, hiatal hernia, impaired memory, nephrolithiasis, osteoarthritis, rheumatoid arthritis who presents to the emergency room with complaints of generalized weakness, diarrhea, increased secretions and dysphagia.Marland KitchenOn presentation she was hypertensive. She was noted to have hypokalemia. Urinalysis was suspicious for UTI. She had mild leukocytosis. Chest x-ray also showed right lung base consolidation. Patient had a Dobbhoff placed on 12/22, started on tube feeding. PEG tube placed on 12/24. Start continue tube feeding on 12/25.  #1. Acute hypoxemic respiratory failure. Aspiration pneumonia secondary to dysphagia. Dysphagia secondary to ALS. Patient has PEG tube placed and started tube feeding. She tolerated continuous tube feeding yesterday, switched to bolus feeding today. Continue Augmentin via feeding tube. Due to recurrent aspiration, patient long-term prognosis is poor.Her underlying disease cannot be treated, even with the tube feeding, she most likely will continue to develop aspiration pneumonia in the future. Will be followed by palliative care after  discharge. Currently, patient on 3 L oxygen per saturation at 100%. Will obtain home oxygen evaluation before discharge. We will also set up home care nurse, physical therapy and speech therapy.  Prescription for Augmentin resent for 7 days.  #2. Hypokalemia and hypophosphatemia. Supplementedpotassium and phosphorus.Potassium and phosphorus level normal today.  #3. Mild hypernatremia. Free water given for tube feeding.Sodium level has normalized.  4. E. coli urinary tract infection. Left hydronephrosis with obstructing kidney stone. Status post ureteral stent. Antibiotic changed to Augmentin. Patient be followed by urology as outpatient.  Patient was scheduled to be discharged on 12/26, she developed volume overload after bolus feeding in a few hours. She received IV Lasix. Then will switch back to continuous feeding again. She tolerated tube feeding since then. Discussed with dietitian, feeding has changed to continuous feeding. I reexamined patient today, she still is stable to be discharged. She was put back on oxygen yesterday after the episode of volume overload, will reassess for home oxygen use.  Consults: urology  Significant Diagnostic Studies:  CT ABDOMEN WITHOUT CONTRAST  TECHNIQUE: Multidetector CT imaging of the abdomen was performed following the standard protocol without IV contrast.  COMPARISON: Sep 20, 2015 and July 15, 2014  FINDINGS: Lower chest: Basilar airspace disease bilaterally. Trace pleural fluid.  Hepatobiliary: Sludge in the gallbladder. Lobular hepatic contours, suggestion of background hepatic steatosis.  Pancreas: Normal appearance of the pancreas with mild fatty atrophy about the pancreatic head.  Spleen: Normal  Adrenals/Urinary Tract: Adrenal glands are normal.  Nephrolithiasis bilaterally. The LEFT proximal ureteral calculi the largest measuring 5 x 7 mm, a smaller calculus just inferior to this approximately 3  x 4 mm.  Mild-to-moderate LEFT-sided hydroureteronephrosis and signs of LEFT-sided perinephric and Peri ureteral stranding.  Intrarenal calculi on the LEFT, 2 remaining in  the kidney largest approximately 3 mm. Approximately 2 mm calculus in the interpolar RIGHT kidney and a 3-4 mm lower pole calculus on the RIGHT.  Stomach/Bowel: Normal appendix. Colonic diverticulosis. Question mild perienteric stranding in the RIGHT lower quadrant. Some signs of mural stratification are also suggested on noncontrast imaging which limits assessment.  Vascular/Lymphatic: Normal caliber abdominal aorta. There is no gastrohepatic or hepatoduodenal ligament lymphadenopathy. No retroperitoneal or mesenteric lymphadenopathy.  Other: No visible ascites. No signs of free air.  Musculoskeletal: No acute bone finding or destructive bone process.  IMPRESSION: 1. LEFT ureteral calculi with signs of obstruction. 2. Nephrolithiasis bilaterally. 3. Question of mild enteritis or sequela of prior enteritis in the RIGHT lower quadrant, not well evaluated. The appendix is visualized and is normal. There are signs of enteritis noted on in April 26 evaluation from 2018. Correlate with any ongoing symptoms. 4. Basilar airspace disease bilaterally, concerning for pneumonia, would also consider aspiration. 5. Lobular hepatic contours, suggestion of background hepatic steatosis. 6. Sludge in the gallbladder. 7. Aortic atherosclerosis.  Aortic Atherosclerosis (ICD10-I70.0).   Electronically Signed By: Donzetta Kohut M.D. On: 04/27/2020 16:41  Treatments: Ureteral stent, PEG tube, antibiotics.  Discharge Exam: Blood pressure 128/74, pulse 83, temperature 97.8 F (36.6 C), resp. rate 14, height 5\' 6"  (1.676 m), weight 76.4 kg, SpO2 99 %. General appearance: alert, cooperative and Aphasic. Resp: clear to auscultation bilaterally Cardio: regular rate and rhythm, S1, S2 normal, no murmur, click,  rub or gallop GI: soft, non-tender; bowel sounds normal; no masses,  no organomegaly Extremities: extremities normal, atraumatic, no cyanosis or edema  Disposition: Discharge disposition: 01-Home or Self Care       Discharge Instructions    Diet general   Complete by: As directed    NPO. Tube feeding: Jevity 1.5, 53ml/hr, increase to 30ml/hr 12/28. Free water 60 ml 6 times a day.   Increase activity slowly   Complete by: As directed    As tolerated, elevate head of bed at 45 degree when in bed.   Increase activity slowly   Complete by: As directed           Allergies as of 05/03/2020      Reactions   Sulfa Antibiotics    Cefdinir Rash   Oxycodone Rash   PER PATIENT ON 7.25.18         Medication List    STOP taking these medications   ADV TURMERIC CURCUMIN COMPLEX PO   cholecalciferol 1000 units tablet Commonly known as: VITAMIN D   gabapentin 100 MG capsule Commonly known as: NEURONTIN   multivitamin capsule   pantoprazole 40 MG tablet Commonly known as: PROTONIX   vitamin C 100 MG tablet     TAKE these medications   amoxicillin-clavulanate 875-125 MG tablet Commonly known as: AUGMENTIN Place 1 tablet into feeding tube every 12 (twelve) hours for 7 days.   feeding supplement (JEVITY 1.5 CAL/FIBER) Liqd Place 1,000 mLs into feeding tube continuous.   free water Soln Place 40 mLs into feeding tube 6 (six) times daily.   hydroxychloroquine 200 MG tablet Commonly known as: PLAQUENIL Place 1 tablet (200 mg total) into feeding tube daily. What changed: how to take this   riluzole 50 MG tablet Commonly known as: RILUTEK Place 1 tablet (50 mg total) into feeding tube every 12 (twelve) hours. What changed: how to take this   scopolamine 1 MG/3DAYS Commonly known as: TRANSDERM-SCOP Place 1 patch (1.5 mg total) onto the skin every 3 (three) days.  Since discharge she has had home health as well as palliative  care involvement. 12/28 note - Per husband pt is still having a lot of lower abdominal pain. Current pain level is an 8 that is constant. Pt is not taking any prescription or OTC pain meds. Pt has only had 1 episode of diarrhea since d/c. Pt c/o of being nausea and is still weak. Husband is still having to suction her mouth from secretions but states it is not as bad as previously. Declines fever, SOB, urinary s/s, vomiting, pain elsewhere, cough or congestions.    [facility] was already visiting with pt. Pre d/c summary HH and pallative care was to be ordered. Husband has not heard from either at this point.

## 2020-05-11 ENCOUNTER — Other Ambulatory Visit: Payer: Self-pay

## 2020-05-11 ENCOUNTER — Inpatient Hospital Stay: Payer: Medicare HMO | Admitting: Internal Medicine

## 2020-05-12 ENCOUNTER — Other Ambulatory Visit: Payer: Self-pay

## 2020-05-12 ENCOUNTER — Other Ambulatory Visit: Payer: Medicare HMO | Admitting: Primary Care

## 2020-05-13 ENCOUNTER — Encounter: Payer: Medicare HMO | Admitting: Internal Medicine

## 2020-05-13 DIAGNOSIS — R1084 Generalized abdominal pain: Secondary | ICD-10-CM

## 2020-05-13 NOTE — Progress Notes (Signed)
Called patient to talk to her before her scheduled virtual visit with Dr. Dorris Fetch. She is having 10/10 abdominal pain, moaning and groaning. Husband said she has not had a bowel movement in 3-4 days. Spoke with Dr. Dorris Fetch and he suggested that they go to the ED for further evaluation. Spoke with patient's husband and he verbalized understanding and agreed to take her.

## 2020-05-26 ENCOUNTER — Ambulatory Visit: Payer: Medicare HMO | Admitting: Urology

## 2020-06-08 DEATH — deceased

## 2020-06-16 ENCOUNTER — Ambulatory Visit: Payer: Medicare HMO | Admitting: Urology

## 2022-06-23 IMAGING — DX DG CHEST 1V PORT
1 series · 1 of 1 positions shown · non-contrast
Comparison: October 16, 2016

CLINICAL DATA: Weakness.  Patient with history of ALS.

EXAM:
PORTABLE CHEST 1 VIEW

[chest ap]
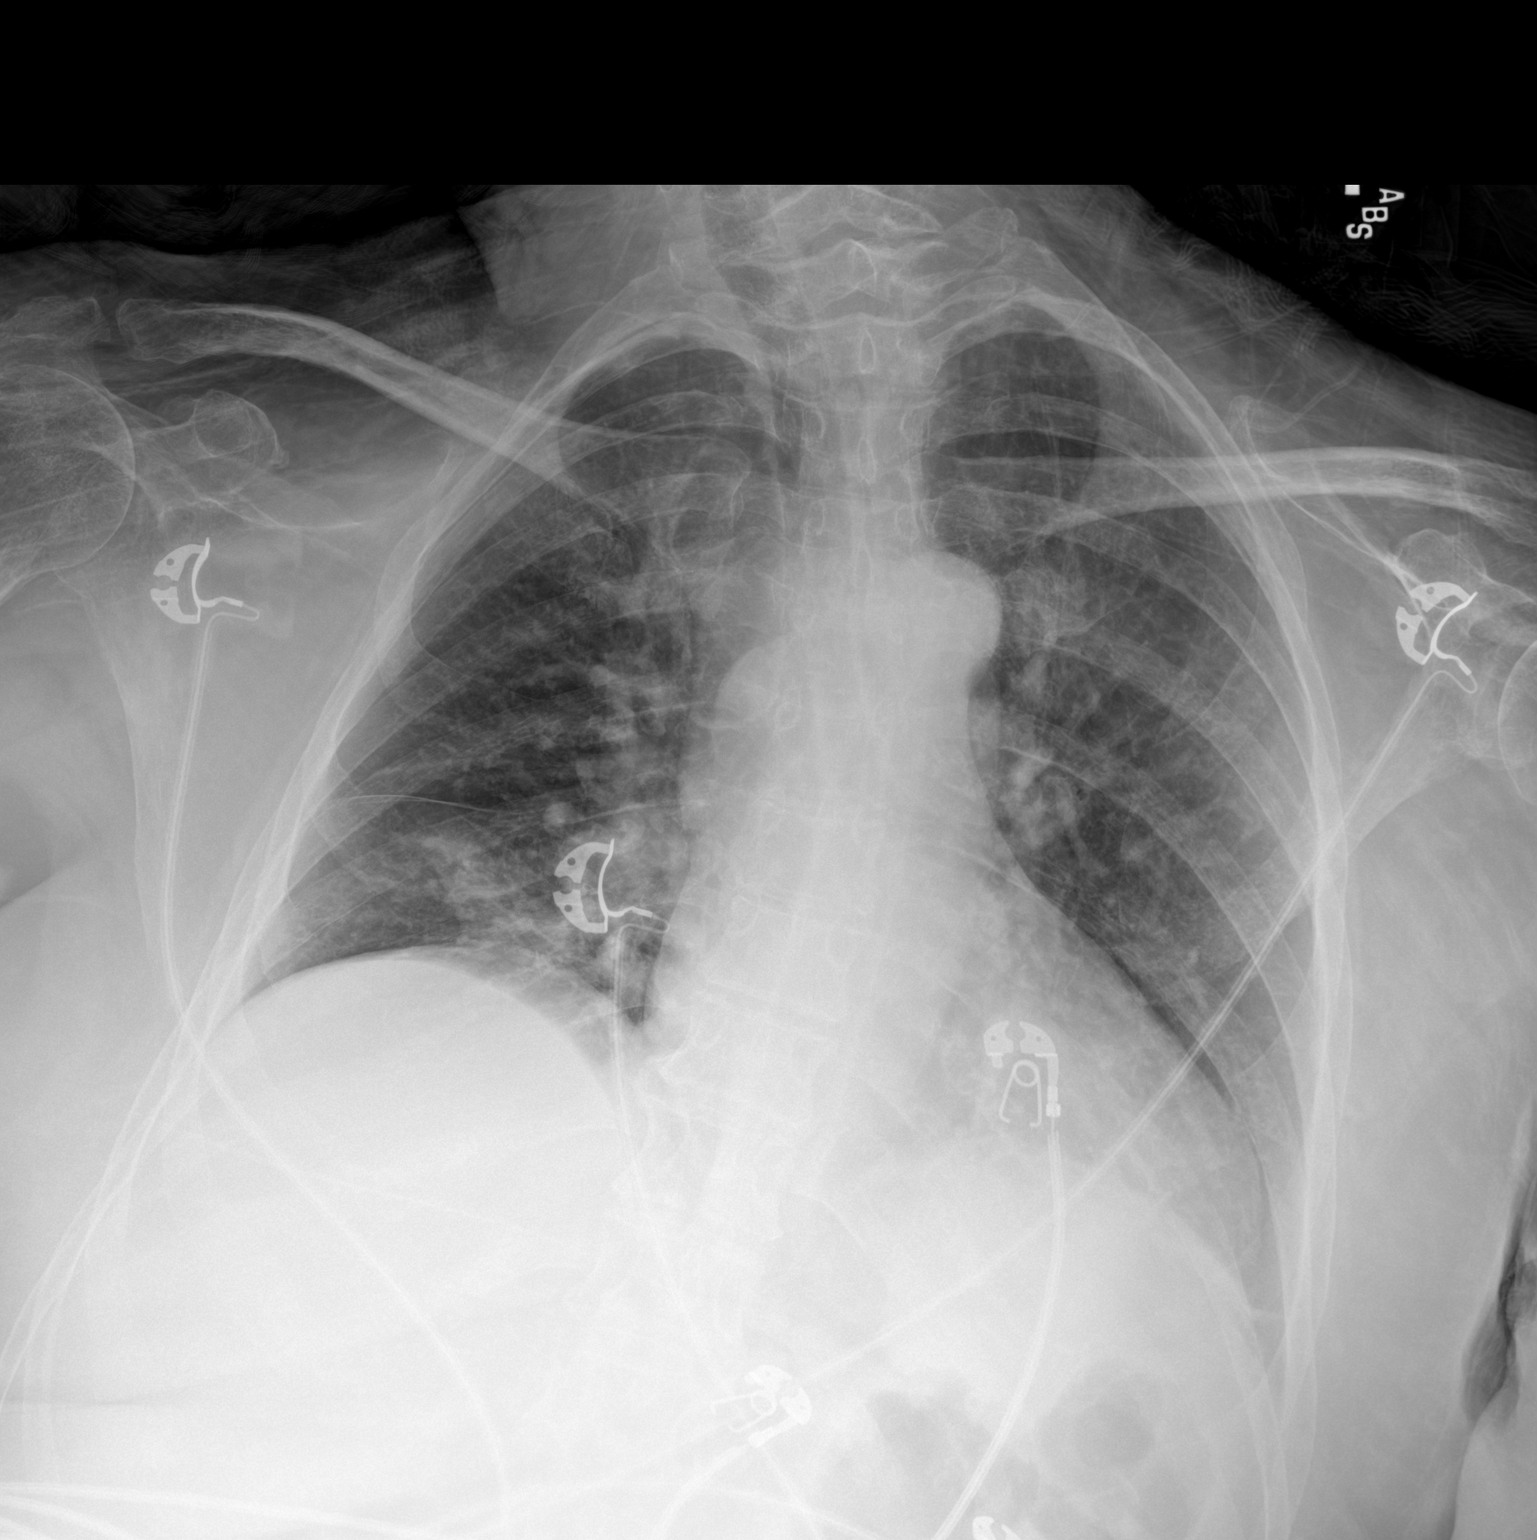

[1 of 1 positions shown; findings below may reference images not displayed]

FINDINGS: Opacity in the medial right lung base. The lungs are otherwise
clear. No pneumothorax. The heart, hila, and mediastinum are normal.
No other acute abnormalities.
IMPRESSION: Opacity in the medial right lung base could represent atelectasis or
infiltrate such as pneumonia or aspiration. Recommend clinical
correlation and short-term follow-up imaging to ensure resolution.
